# Patient Record
Sex: Female | Born: 1970
Health system: Southern US, Community
[De-identification: ages and names within clinical notes are randomized; demographics above are authoritative.]

## PROBLEM LIST (undated history)

## (undated) DIAGNOSIS — E039 Hypothyroidism, unspecified: Secondary | ICD-10-CM

## (undated) DIAGNOSIS — F419 Anxiety disorder, unspecified: Secondary | ICD-10-CM

## (undated) DIAGNOSIS — K589 Irritable bowel syndrome without diarrhea: Secondary | ICD-10-CM

## (undated) DIAGNOSIS — Z8489 Family history of other specified conditions: Secondary | ICD-10-CM

## (undated) DIAGNOSIS — T8859XA Other complications of anesthesia, initial encounter: Secondary | ICD-10-CM

## (undated) DIAGNOSIS — Z9889 Other specified postprocedural states: Secondary | ICD-10-CM

## (undated) DIAGNOSIS — I10 Essential (primary) hypertension: Secondary | ICD-10-CM

## (undated) DIAGNOSIS — F329 Major depressive disorder, single episode, unspecified: Secondary | ICD-10-CM

## (undated) DIAGNOSIS — B009 Herpesviral infection, unspecified: Secondary | ICD-10-CM

## (undated) DIAGNOSIS — E079 Disorder of thyroid, unspecified: Secondary | ICD-10-CM

## (undated) DIAGNOSIS — U071 COVID-19: Secondary | ICD-10-CM

## (undated) DIAGNOSIS — R112 Nausea with vomiting, unspecified: Secondary | ICD-10-CM

## (undated) DIAGNOSIS — F32A Depression, unspecified: Secondary | ICD-10-CM

## (undated) DIAGNOSIS — R519 Headache, unspecified: Secondary | ICD-10-CM

## (undated) DIAGNOSIS — K219 Gastro-esophageal reflux disease without esophagitis: Secondary | ICD-10-CM

## (undated) DIAGNOSIS — D649 Anemia, unspecified: Secondary | ICD-10-CM

## (undated) DIAGNOSIS — Z833 Family history of diabetes mellitus: Secondary | ICD-10-CM

## (undated) DIAGNOSIS — F909 Attention-deficit hyperactivity disorder, unspecified type: Secondary | ICD-10-CM

## (undated) DIAGNOSIS — T4145XA Adverse effect of unspecified anesthetic, initial encounter: Secondary | ICD-10-CM

## (undated) DIAGNOSIS — G4730 Sleep apnea, unspecified: Secondary | ICD-10-CM

## (undated) HISTORY — DX: Gastro-esophageal reflux disease without esophagitis: K21.9

## (undated) HISTORY — DX: Disorder of thyroid, unspecified: E07.9

## (undated) HISTORY — DX: Major depressive disorder, single episode, unspecified: F32.9

## (undated) HISTORY — PX: BREAST SURGERY: SHX581

## (undated) HISTORY — PX: TUBAL LIGATION: SHX77

## (undated) HISTORY — DX: Essential (primary) hypertension: I10

## (undated) HISTORY — DX: Herpesviral infection, unspecified: B00.9

## (undated) HISTORY — PX: CRYOTHERAPY: SHX1416

## (undated) HISTORY — DX: Family history of diabetes mellitus: Z83.3

## (undated) HISTORY — DX: Anxiety disorder, unspecified: F41.9

## (undated) HISTORY — DX: COVID-19: U07.1

## (undated) HISTORY — DX: Attention-deficit hyperactivity disorder, unspecified type: F90.9

## (undated) HISTORY — PX: TONSILLECTOMY: SUR1361

## (undated) HISTORY — DX: Irritable bowel syndrome, unspecified: K58.9

## (undated) HISTORY — PX: ESSURE TUBAL LIGATION: SUR464

## (undated) HISTORY — PX: OTHER SURGICAL HISTORY: SHX169

## (undated) HISTORY — DX: Depression, unspecified: F32.A

---

## 1991-08-14 DIAGNOSIS — O24419 Gestational diabetes mellitus in pregnancy, unspecified control: Secondary | ICD-10-CM

## 1991-08-14 HISTORY — DX: Gestational diabetes mellitus in pregnancy, unspecified control: O24.419

## 2007-10-22 ENCOUNTER — Ambulatory Visit: Payer: Self-pay | Admitting: General Surgery

## 2008-05-03 ENCOUNTER — Ambulatory Visit: Payer: Self-pay | Admitting: General Surgery

## 2008-08-13 HISTORY — PX: BREAST EXCISIONAL BIOPSY: SUR124

## 2008-11-30 ENCOUNTER — Ambulatory Visit: Payer: Self-pay | Admitting: General Surgery

## 2009-03-17 ENCOUNTER — Ambulatory Visit: Payer: Self-pay | Admitting: Obstetrics & Gynecology

## 2010-03-17 ENCOUNTER — Emergency Department: Payer: Self-pay | Admitting: Emergency Medicine

## 2011-07-16 ENCOUNTER — Ambulatory Visit: Payer: Self-pay | Admitting: Otolaryngology

## 2011-12-19 ENCOUNTER — Ambulatory Visit: Payer: Self-pay | Admitting: Otolaryngology

## 2014-11-24 ENCOUNTER — Ambulatory Visit
Admit: 2014-11-24 | Disposition: A | Discharge status: Home / Self Care | Payer: Self-pay | Attending: Family Medicine | Admitting: Family Medicine

## 2015-01-19 ENCOUNTER — Telehealth: Payer: Self-pay | Admitting: Obstetrics and Gynecology

## 2015-01-19 NOTE — Telephone Encounter (Signed)
PT CALLED AND WAS WANTING TO KNOW THE RESULTS OF HER BX.Francis Dowse.SHE WAS HERE BEFORE GO [facility]IVE AND SHE DOES HAVE AN APPT ON 6/21 FOR A F/U AUB AND WASN'T SURE IOF SHE NEEDED TO WAIT UNTIL THAT APPT TO GET THE RESULTS OR IF SHE COULD GO AHEAD AND GET THEM.

## 2015-01-19 NOTE — Telephone Encounter (Signed)
Please notify:  Normal Endometrial biopsy

## 2015-01-19 NOTE — Telephone Encounter (Signed)
pls review endometrial bx results and I will call pt.- ty

## 2015-01-21 NOTE — Telephone Encounter (Signed)
Pt aware.

## 2015-02-01 ENCOUNTER — Encounter: Payer: Self-pay | Admitting: Obstetrics and Gynecology

## 2015-02-01 ENCOUNTER — Ambulatory Visit (INDEPENDENT_AMBULATORY_CARE_PROVIDER_SITE_OTHER): Payer: BLUE CROSS/BLUE SHIELD | Admitting: Obstetrics and Gynecology

## 2015-02-01 VITALS — BP 126/83 | HR 97 | Ht 61.0 in | Wt 158.2 lb

## 2015-02-01 DIAGNOSIS — K589 Irritable bowel syndrome without diarrhea: Secondary | ICD-10-CM | POA: Insufficient documentation

## 2015-02-01 DIAGNOSIS — A6000 Herpesviral infection of urogenital system, unspecified: Secondary | ICD-10-CM | POA: Insufficient documentation

## 2015-02-01 DIAGNOSIS — I10 Essential (primary) hypertension: Secondary | ICD-10-CM | POA: Insufficient documentation

## 2015-02-01 DIAGNOSIS — F419 Anxiety disorder, unspecified: Secondary | ICD-10-CM | POA: Insufficient documentation

## 2015-02-01 DIAGNOSIS — Z720 Tobacco use: Secondary | ICD-10-CM | POA: Insufficient documentation

## 2015-02-01 DIAGNOSIS — N926 Irregular menstruation, unspecified: Secondary | ICD-10-CM

## 2015-02-01 DIAGNOSIS — F909 Attention-deficit hyperactivity disorder, unspecified type: Secondary | ICD-10-CM | POA: Insufficient documentation

## 2015-02-01 DIAGNOSIS — N910 Primary amenorrhea: Secondary | ICD-10-CM | POA: Diagnosis not present

## 2015-02-01 DIAGNOSIS — N939 Abnormal uterine and vaginal bleeding, unspecified: Secondary | ICD-10-CM

## 2015-02-01 DIAGNOSIS — F32A Depression, unspecified: Secondary | ICD-10-CM | POA: Insufficient documentation

## 2015-02-01 DIAGNOSIS — K219 Gastro-esophageal reflux disease without esophagitis: Secondary | ICD-10-CM | POA: Insufficient documentation

## 2015-02-01 DIAGNOSIS — E039 Hypothyroidism, unspecified: Secondary | ICD-10-CM | POA: Insufficient documentation

## 2015-02-01 DIAGNOSIS — F329 Major depressive disorder, single episode, unspecified: Secondary | ICD-10-CM | POA: Insufficient documentation

## 2015-02-01 LAB — POCT URINE PREGNANCY: PREG TEST UR: NEGATIVE

## 2015-02-01 NOTE — Progress Notes (Signed)
Patient ID: Pamela Avila, female   DOB: 1970/10/10, 44 y.o.   MRN: [id number]   Pt presents for AUB. S/p endosee and emb. Pt is 15 days late  For cycle.    OB/GYN CONFERENCE NOTE:  Subjective:       Pamela Avila is a 44 y.o. [phone] female who presents for a conference appointment. Current complaints include:  1.  Abnormal uterine bleeding.  Pamela Avila presents today for conference to discuss further management planning of abnormal uterine bleeding.  She is using the Essure device for contraception.  Her cycles have been troublesome over the past year with pre-and postmenstrual bleeding of 5 days' duration.  The patient recently underwent endometrial biopsy that revealed proliferative endometrium without hyperplasia or carcinoma.  Patient is desiring treatment for this condition.  If bleeding persists.  NovaSure endometrial ablation was discussed; patient will contact us if she wishes to proceed with this procedure in the near future.   Gynecologic History Patient's last menstrual period was 12/23/2014 (lmp unknown). Contraception: E sure  Obstetric History OB History  Gravida Para Term Preterm AB SAB TAB Ectopic Multiple Living  # Outcome Date GA Lbr Len/2nd Weight Sex Delivery Anes PTL Lv  3 Term 1999   6 lb 4.8 oz (2.858 kg) F CS-Unspec   Y  2A Gravida 1997    F    ND  2B Preterm 1997   1 lb 6.4 oz (0.635 kg) F CS-Unspec   Y  1 Term 1993   7 lb 4.8 oz (3.311 kg) M VBAC   Y      Past Medical History  Diagnosis Date  . HSV (herpes simplex virus) infection   . GERD (gastroesophageal reflux disease)   . IBS (irritable bowel syndrome)   . Hypertension   . ADHD (attention deficit hyperactivity disorder)   . Anxiety disorder   . Depression   . Thyroid disease     hypothroidsim    Past Surgical History  Procedure Laterality Date  . Essure tubal ligation    . Tonsillectomy    . Cesarean section      x 2  . Lumpectomy Right   . Cryotherapy  20 + years ago     cervix    No current outpatient prescriptions on file prior to visit.   No current facility-administered medications on file prior to visit.    Allergies  Allergen Reactions  . Erythromycin Itching    History   Social History  . Marital Status: Married    Spouse Name: N/A  . Number of Children: N/A  . Years of Education: N/A   Occupational History  . Not on file.   Social History Main Topics  . Smoking status: Light Tobacco Smoker -- 0.25 packs/day    Types: Cigarettes  . Smokeless tobacco: Not on file  . Alcohol Use: Yes     Comment: occasional  . Drug Use: No  . Sexual Activity: Yes   Other Topics Concern  . Not on file   Social History Narrative  . No narrative on file    Family History  Problem Relation Age of Onset  . Hypothyroidism Paternal Grandfather   . Breast cancer Paternal Grandmother   . Breast cancer Maternal Grandmother   . Hyperlipidemia Maternal Grandfather   . Diabetes Father   . Colon cancer Neg Hx   . Ovarian cancer Paternal Aunt     ?  The following portions of the patient's history were reviewed and updated as appropriate: allergies, current medications, past family history, past medical history, past social history, past surgical history and problem list.  Review of Systems  Objective:   BP 126/83 mmHg  Pulse 97  Ht 5\' 1"  (1.549 m)  Wt 158 lb 3.2 oz (71.759 kg)  BMI 29.91 kg/m2  LMP 12/23/2014 (LMP Unknown)   Assessment/Plan:  1.  Abnormal uterine bleeding. Plan: NovaSure endometrial ablation in the near future.    A total of 15 minutes were spent face-to-face with the patient during this encounter and over half of that time dealt with counseling and coordination of care.   Return to Clinic: 4-6 weeks for preop   Herold Harms, MD [facility]

## 2015-02-01 NOTE — Patient Instructions (Signed)
1.  Menstrual calendar, monitoring 2.  NovaSure endometrial ablation.-Scheduled. 3.  Return for preop appointment

## 2015-02-17 ENCOUNTER — Other Ambulatory Visit: Payer: Self-pay

## 2015-02-18 MED ORDER — AMPHETAMINE-DEXTROAMPHETAMINE 7.5 MG PO TABS: 1.0000 | ORAL_TABLET | Freq: Two times a day (BID) | ORAL | Status: DC | PRN

## 2015-03-08 ENCOUNTER — Encounter: Payer: Self-pay | Admitting: Family Medicine

## 2015-03-08 NOTE — Progress Notes (Signed)
Patient has appt for controlled substances on August 15th [place] reviewed Fill dates:  July 8th, May 31st, April 20th She does not take her medicine every single day She certainly will not need a refill of medicine while I am out of the office August 1-5 She should not need any additional medicine prior to appointment with me in the office

## 2015-03-16 DIAGNOSIS — E079 Disorder of thyroid, unspecified: Secondary | ICD-10-CM | POA: Insufficient documentation

## 2015-03-16 DIAGNOSIS — F419 Anxiety disorder, unspecified: Secondary | ICD-10-CM | POA: Insufficient documentation

## 2015-03-16 DIAGNOSIS — F329 Major depressive disorder, single episode, unspecified: Secondary | ICD-10-CM | POA: Insufficient documentation

## 2015-03-16 DIAGNOSIS — Z833 Family history of diabetes mellitus: Secondary | ICD-10-CM | POA: Insufficient documentation

## 2015-03-16 DIAGNOSIS — K589 Irritable bowel syndrome without diarrhea: Secondary | ICD-10-CM | POA: Insufficient documentation

## 2015-03-16 DIAGNOSIS — I10 Essential (primary) hypertension: Secondary | ICD-10-CM | POA: Insufficient documentation

## 2015-03-16 DIAGNOSIS — F32A Depression, unspecified: Secondary | ICD-10-CM | POA: Insufficient documentation

## 2015-03-28 ENCOUNTER — Encounter: Payer: BLUE CROSS/BLUE SHIELD | Admitting: Family Medicine

## 2015-03-28 ENCOUNTER — Telehealth: Payer: Self-pay | Admitting: Family Medicine

## 2015-03-28 NOTE — Telephone Encounter (Signed)
Pt came in and was wondering about her adderall. She was scheduled for today but we had to reschedule and she was wondering when she would be getting her refill.

## 2015-03-29 ENCOUNTER — Other Ambulatory Visit: Payer: Self-pay | Admitting: Family Medicine

## 2015-03-29 MED ORDER — AMPHETAMINE-DEXTROAMPHETAMINE 7.5 MG PO TABS: 1.0000 | ORAL_TABLET | Freq: Two times a day (BID) | ORAL | Status: DC | PRN

## 2015-03-29 NOTE — Telephone Encounter (Signed)
Routing to provider  

## 2015-03-29 NOTE — Telephone Encounter (Signed)
I apologize for being out of the office yesterday I have a prescription ready for pick-up; ask her to schedule an appt within the next two weeks please for ADHD follow-up

## 2015-03-30 NOTE — Telephone Encounter (Signed)
Routing to provider  

## 2015-04-07 ENCOUNTER — Encounter
Admission: RE | Admit: 2015-04-07 | Discharge: 2015-04-07 | Disposition: A | Discharge status: Home / Self Care | Payer: BLUE CROSS/BLUE SHIELD | Source: Ambulatory Visit | Attending: Obstetrics and Gynecology | Admitting: Obstetrics and Gynecology

## 2015-04-07 ENCOUNTER — Ambulatory Visit (INDEPENDENT_AMBULATORY_CARE_PROVIDER_SITE_OTHER): Payer: BLUE CROSS/BLUE SHIELD | Admitting: Obstetrics and Gynecology

## 2015-04-07 ENCOUNTER — Other Ambulatory Visit: Payer: Self-pay

## 2015-04-07 ENCOUNTER — Encounter: Payer: Self-pay | Admitting: Obstetrics and Gynecology

## 2015-04-07 VITALS — BP 126/85 | HR 111 | Ht 61.0 in | Wt 158.8 lb

## 2015-04-07 DIAGNOSIS — Z01818 Encounter for other preprocedural examination: Secondary | ICD-10-CM

## 2015-04-07 DIAGNOSIS — N939 Abnormal uterine and vaginal bleeding, unspecified: Secondary | ICD-10-CM

## 2015-04-07 DIAGNOSIS — Z01810 Encounter for preprocedural cardiovascular examination: Secondary | ICD-10-CM | POA: Insufficient documentation

## 2015-04-07 DIAGNOSIS — Z01812 Encounter for preprocedural laboratory examination: Secondary | ICD-10-CM | POA: Insufficient documentation

## 2015-04-07 HISTORY — DX: Hypothyroidism, unspecified: E03.9

## 2015-04-07 HISTORY — DX: Anemia, unspecified: D64.9

## 2015-04-07 LAB — CBC WITH DIFFERENTIAL/PLATELET
BASOS ABS: 0.1 10*3/uL (ref 0–0.1)
Basophils Relative: 1 %
EOS PCT: 3 %
Eosinophils Absolute: 0.2 10*3/uL (ref 0–0.7)
HEMATOCRIT: 41.4 % (ref 35.0–47.0)
Hemoglobin: 13.8 g/dL (ref 12.0–16.0)
LYMPHS PCT: 34 %
Lymphs Abs: 3 10*3/uL (ref 1.0–3.6)
MCH: 29.9 pg (ref 26.0–34.0)
MCHC: 33.4 g/dL (ref 32.0–36.0)
MCV: 89.3 fL (ref 80.0–100.0)
MONO ABS: 0.8 10*3/uL (ref 0.2–0.9)
MONOS PCT: 9 %
NEUTROS ABS: 4.6 10*3/uL (ref 1.4–6.5)
Neutrophils Relative %: 53 %
PLATELETS: 326 10*3/uL (ref 150–440)
RBC: 4.64 MIL/uL (ref 3.80–5.20)
RDW: 13 % (ref 11.5–14.5)
WBC: 8.7 10*3/uL (ref 3.6–11.0)

## 2015-04-07 LAB — RAPID HIV SCREEN (HIV 1/2 AB+AG)
HIV 1/2 Antibodies: NONREACTIVE
HIV-1 P24 Antigen - HIV24: NONREACTIVE

## 2015-04-07 NOTE — Progress Notes (Signed)
Patient ID: Pamela Avila, female   DOB: 01-31-1971, 44 y.o.   MRN: [id number] Pre-op Hysteroscopy, D&C, novasure ablation- 04/11/2015  Subjective:     Pamela Avila is a 44 y.o. [phone] female who presents for Surgical management of abnormal uterine bleeding refractory to conservative medical therapy.  She is using the Essure device for contraception. Her cycles have been troublesome over the past year with pre-and postmenstrual bleeding of 5 days' duration.  The patient recently underwent endometrial biopsy that revealed proliferative endometrium without hyperplasia or carcinoma.  Patient is desiring treatment Through hysteroscopy/D&C and NovaSure endometrial ablation.  Gynecologic History Patient's last menstrual period was 12/23/2014 (lmp unknown). Contraception: Essure Discussed Blood/Blood Products: no   Menstrual History: OB History    Gravida Para Term Preterm AB TAB SAB Ectopic Multiple Living   Menarche age: NA  Patient's last menstrual period was 03/21/2015 (exact date).    Past Medical History  Diagnosis Date  . HSV (herpes simplex virus) infection   . GERD (gastroesophageal reflux disease)   . IBS (irritable bowel syndrome)   . ADHD (attention deficit hyperactivity disorder)   . Anxiety disorder   . Thyroid disease     hypothroidsim  . Anxiety   . Depression   . Hypertension   . Family history of diabetes mellitus   . Hypothyroidism   . Anemia     Past Surgical History  Procedure Laterality Date  . Essure tubal ligation    . Tonsillectomy    . Lumpectomy Right   . Cryotherapy  20 + years ago    cervix  . Cesarean section      x 2  . Tubal ligation    . Breast surgery Right     lumpectomy    OB History  Gravida Para Term Preterm AB SAB TAB Ectopic Multiple Living  # Outcome Date GA Lbr Len/2nd Weight Sex Delivery Anes PTL Lv  3 Term 1999   6 lb 4.8 oz (2.858 kg) F CS-Unspec   Y   2A Gravida 1997    F    ND  2B Preterm 1997   1 lb 6.4 oz (0.635 kg) F CS-Unspec   Y  1 Term 1993   7 lb 4.8 oz (3.311 kg) M VBAC   Y      Social History   Social History  . Marital Status: Married    Spouse Name: N/A  . Number of Children: N/A  . Years of Education: N/A   Social History Main Topics  . Smoking status: Light Tobacco Smoker -- 0.25 packs/day    Types: Cigarettes  . Smokeless tobacco: Never Used  . Alcohol Use: Yes     Comment: occasional  . Drug Use: No  . Sexual Activity: Yes   Other Topics Concern  . None   Social History Narrative    Family History  Problem Relation Age of Onset  . Hypothyroidism Paternal Grandfather   . Breast cancer Paternal Grandmother   . Cancer Paternal Grandmother     breast  . Breast cancer Maternal Grandmother   . Hyperlipidemia Maternal Grandfather   . Diabetes Father   . Obesity Father   . Hypertension Father   . Hyperlipidemia Father   . Colon cancer Neg Hx   . Ovarian cancer Paternal Aunt     ?  [name]  Cancer Paternal Aunt     breast and cervical  . Breast cancer Paternal Aunt   . Hypothyroidism Mother   . Breast cancer Son   . Cancer Son     breast     (Not in a hospital admission)  Allergies  Allergen Reactions  . Erythromycin Itching    Review of Systems Constitutional: No recent fever/chills/sweats Respiratory: No recent cough/bronchitis Cardiovascular: No chest pain Gastrointestinal: No recent nausea/vomiting/diarrhea Genitourinary: No UTI symptoms Hematologic/lymphatic:No history of coagulopathy or recent blood thinner use    Objective:    BP 126/85 mmHg  Pulse 111  Ht 5\' 1"  (1.549 m)  Wt 158 lb 12.8 oz (72.031 kg)  BMI 30.02 kg/m2  LMP 03/21/2015 (Exact Date)  General:   Normal  Skin:   normal  HEENT:  Normal  Neck:  Supple without Adenopathy or Thyromegaly  Lungs:   Heart:              Breasts:   Abdomen:  Pelvis:  M/S   Extremeties:  Neuro:    clear to auscultation  bilaterally   Normal without murmur   Not Examined   soft, non-tender; bowel sounds normal; no masses,  no organomegaly   Exam deferred to OR  No CVAT  Warm/Dry   Normal          Assessment:  1.  Abnormal uterine bleeding refractory to conservative medical therapies. Plan:  Hysteroscopy/D&C with NovaSure endometrial ablation.   Counseling: Procedure, risks, reasons, benefits and complications (including injury to bowel, bladder, major blood vessel, ureter, bleeding, possibility of transfusion, infection, or fistula formation) reviewed in detail. Consent signed. Preop testing ordered. Instructions reviewed, including NPO after midnight.

## 2015-04-07 NOTE — Patient Instructions (Signed)
1.  Return in 1 week for postop check 

## 2015-04-07 NOTE — H&P (Signed)
Patient ID: Pamela Avila, female   DOB: 1971-02-04, 44 y.o.   MRN: [id number] Pre-op Hysteroscopy, D&C, novasure ablation- 04/11/2015  PREOPERATIVE HISTORY AND PHYSICAL  Date of surgery: 04/11/2015 Diagnosis: 1.  Abnormal uterine bleeding refractory to conservative medical therapy    Subjective:  Pamela Avila is a 44 y.o. [phone] female who presents for Surgical management of abnormal uterine bleeding refractory to conservative medical therapy.  She is using the Essure device for contraception.  Her cycles have been troublesome over the past year with pre-and postmenstrual bleeding of 5 days' duration.  The patient recently underwent endometrial biopsy that revealed proliferative endometrium without hyperplasia or carcinoma.  Patient is desiring treatment Through hysteroscopy/D&C and NovaSure endometrial ablation.    Gynecologic History Patient's last menstrual period was 12/23/2014 (lmp unknown). Contraception: Essure Discussed Blood/Blood Products: no              Menstrual History: OB History      Gravida  Para  Term  Preterm  AB  TAB  SAB  Ectopic  Multiple  Living     Menarche age: NA Patient's last menstrual period was 03/21/2015 (exact date).    Past Medical History:   .  HSV (herpes simplex virus) infection     .  GERD (gastroesophageal reflux disease)     .  IBS (irritable bowel syndrome)     .  ADHD (attention deficit hyperactivity disorder)     .  Anxiety disorder     .  Thyroid disease         hypothroidsim   .  Anxiety     .  Depression     .  Hypertension     .  Family history of diabetes mellitus     .  Hypothyroidism     .  Anemia      Past Surgical History: .  Essure tubal ligation       .  Tonsillectomy       .  Lumpectomy  Right     .  Cryotherapy    20 + years ago       cervix   .  Cesarean section           x 2   .  Tubal ligation       .  Breast surgery  Right         lumpectomy    OB History:   Gravida  Para  Term   Preterm  AB  SAB  TAB  Ectopic  Multiple  Living   #  Outcome  Date  GA  Lbr Len/2nd  Weight  Sex  Delivery  Anes  PTL  Lv   3  Term  1999      6 lb 4.8 oz (2.858 kg)  F  CS-Unspec      Y   2A  Gravida  1997        F        ND   2B  Preterm  1997      1 lb 6.4 oz (0.635 kg)  F  CS-Unspec      Y   1  Term  1993  7 lb 4.8 oz (3.311 kg)  M  VBAC      Y      Social History   .  Marital Status:  Married       Spouse Name:  N/A   .  Number of Children:  N/A   .  Years of Education:  N/A       Social History Main Topics   .  Smoking status:  Light Tobacco Smoker -- 0.25 packs/day       Types:  Cigarettes   .  Smokeless tobacco:  Never Used   .  Alcohol Use:  Yes         Comment: occasional   .  Drug Use:  No   .  Sexual Activity:  Yes       Other Topics  Concern   .  None       Social History Narrative       Family History   Problem  Relation  Age of Onset   .  Hypothyroidism  Paternal Grandfather     .  Breast cancer  Paternal Grandmother     .  Cancer  Paternal Grandmother         breast   .  Breast cancer  Maternal Grandmother     .  Hyperlipidemia  Maternal Grandfather     .  Diabetes  Father     .  Obesity  Father     .  Hypertension  Father     .  Hyperlipidemia  Father     .  Colon cancer  Neg Hx     .  Ovarian cancer  Paternal Aunt         ?   [name]  Cancer  Paternal Aunt         breast and cervical   .  Breast cancer  Paternal Aunt     .  Hypothyroidism  Mother     .  Breast cancer  Son     .  Cancer  Son         breast      (Not in a hospital admission)    Allergies   Allergen  Reactions   .  Erythromycin  Itching      Review of Systems Constitutional: No recent fever/chills/sweats Respiratory: No recent cough/bronchitis Cardiovascular: No chest pain Gastrointestinal: No recent nausea/vomiting/diarrhea Genitourinary: No UTI symptoms Hematologic/lymphatic:No history of coagulopathy or recent blood thinner use      Objective:   BP 126/85 mmHg  Pulse 111  Ht 5\' 1"  (1.549 m)  Wt 158 lb 12.8 oz (72.031 kg)  BMI 30.02 kg/m2  LMP 03/21/2015 (Exact Date)    General:    Normal   Skin:    normal   HEENT:   Normal   Neck:   Supple without Adenopathy or Thyromegaly   Lungs:     clear to auscultation bilaterally   Heart:   Normal without murmur    Breasts:   Not Examined   Abdomen:  soft, non-tender; bowel sounds normal; no masses,  no organomegaly   Pelvis:  Exam deferred to OR   M/S   No CVAT   Extremeties:  Warm/Dry  Neuro:  Normal     Assessment:   1.  Abnormal uterine bleeding refractory to conservative medical therapies.  Plan:   Hysteroscopy/D&C with NovaSure endometrial ablation.   Counseling: Procedure, risks, reasons, benefits and complications (including injury to bowel, bladder,  major blood vessel, ureter, bleeding, possibility of transfusion, infection, or fistula formation) reviewed in detail. Consent signed. Preop testing ordered. Instructions reviewed, including NPO after midnight.  Herold Harms, MD

## 2015-04-07 NOTE — Patient Instructions (Signed)
  Your procedure is scheduled on: August 29,2016 (Monday) Report to Day Surgery. [facility] To find out your arrival time please call [phone] between 1PM - 3PM on April 08, 2015 (Friday).  Remember: Instructions that are not followed completely may result in serious medical risk, up to and including death, or upon the discretion of your surgeon and anesthesiologist your surgery may need to be rescheduled.    __x__ 1. Do not eat food or drink liquids after midnight. No gum chewing or hard candies.     __x__ 2. No Alcohol for 24 hours before or after surgery.   ____ 3. Bring all medications with you on the day of surgery if instructed.    __x__ 4. Notify your doctor if there is any change in your medical condition     (cold, fever, infections).     Do not wear jewelry, make-up, hairpins, clips or nail polish.  Do not wear lotions, powders, or perfumes. You may wear deodorant.  Do not shave 48 hours prior to surgery. Men may shave face and neck.  Do not bring valuables to the hospital.    [facility] is not responsible for any belongings or valuables.               Contacts, dentures or bridgework may not be worn into surgery.  Leave your suitcase in the car. After surgery it may be brought to your room.  For patients admitted to the hospital, discharge time is determined by your                treatment team.   Patients discharged the day of surgery will not be allowed to drive home.   Please read over the following fact sheets that you were given:      ____ Take these medicines the morning of surgery with A SIP OF WATER:    1. Take Metoprolol as usual  2.   3.   4.  5.  6.  ____ Fleet Enema (as directed)   ____ Use CHG Soap as directed  ____ Use inhalers on the day of surgery  ____ Stop metformin 2 days prior to surgery    ____ Take 1/2 of usual insulin dose the night before surgery and none on the morning of surgery.   ____ Stop Coumadin/Plavix/aspirin on    ____ Stop Anti-inflammatories on    ____ Stop supplements until after surgery.    ____ Bring C-Pap to the hospital.

## 2015-04-08 LAB — RPR: RPR: NONREACTIVE

## 2015-04-11 ENCOUNTER — Ambulatory Visit: Payer: BLUE CROSS/BLUE SHIELD | Admitting: Certified Registered"

## 2015-04-11 ENCOUNTER — Other Ambulatory Visit: Payer: Self-pay | Admitting: Obstetrics and Gynecology

## 2015-04-11 ENCOUNTER — Encounter
Admission: RE | Disposition: A | Discharge status: Home / Self Care | Payer: Self-pay | Source: Ambulatory Visit | Attending: Obstetrics and Gynecology

## 2015-04-11 ENCOUNTER — Ambulatory Visit
Admission: RE | Admit: 2015-04-11 | Discharge: 2015-04-11 | Disposition: A | Discharge status: Home / Self Care | Payer: BLUE CROSS/BLUE SHIELD | Source: Ambulatory Visit | Attending: Obstetrics and Gynecology | Admitting: Obstetrics and Gynecology

## 2015-04-11 ENCOUNTER — Encounter: Payer: Self-pay | Admitting: *Deleted

## 2015-04-11 DIAGNOSIS — F17210 Nicotine dependence, cigarettes, uncomplicated: Secondary | ICD-10-CM | POA: Insufficient documentation

## 2015-04-11 DIAGNOSIS — K219 Gastro-esophageal reflux disease without esophagitis: Secondary | ICD-10-CM | POA: Insufficient documentation

## 2015-04-11 DIAGNOSIS — Z8249 Family history of ischemic heart disease and other diseases of the circulatory system: Secondary | ICD-10-CM | POA: Diagnosis not present

## 2015-04-11 DIAGNOSIS — Z8489 Family history of other specified conditions: Secondary | ICD-10-CM | POA: Insufficient documentation

## 2015-04-11 DIAGNOSIS — Z833 Family history of diabetes mellitus: Secondary | ICD-10-CM | POA: Insufficient documentation

## 2015-04-11 DIAGNOSIS — E039 Hypothyroidism, unspecified: Secondary | ICD-10-CM | POA: Insufficient documentation

## 2015-04-11 DIAGNOSIS — F329 Major depressive disorder, single episode, unspecified: Secondary | ICD-10-CM | POA: Insufficient documentation

## 2015-04-11 DIAGNOSIS — Z8349 Family history of other endocrine, nutritional and metabolic diseases: Secondary | ICD-10-CM | POA: Diagnosis not present

## 2015-04-11 DIAGNOSIS — I10 Essential (primary) hypertension: Secondary | ICD-10-CM | POA: Diagnosis not present

## 2015-04-11 DIAGNOSIS — Z803 Family history of malignant neoplasm of breast: Secondary | ICD-10-CM | POA: Diagnosis not present

## 2015-04-11 DIAGNOSIS — N939 Abnormal uterine and vaginal bleeding, unspecified: Secondary | ICD-10-CM | POA: Diagnosis not present

## 2015-04-11 DIAGNOSIS — D649 Anemia, unspecified: Secondary | ICD-10-CM | POA: Diagnosis not present

## 2015-04-11 DIAGNOSIS — K589 Irritable bowel syndrome without diarrhea: Secondary | ICD-10-CM | POA: Diagnosis not present

## 2015-04-11 DIAGNOSIS — Z881 Allergy status to other antibiotic agents status: Secondary | ICD-10-CM | POA: Diagnosis not present

## 2015-04-11 DIAGNOSIS — F419 Anxiety disorder, unspecified: Secondary | ICD-10-CM | POA: Insufficient documentation

## 2015-04-11 DIAGNOSIS — F909 Attention-deficit hyperactivity disorder, unspecified type: Secondary | ICD-10-CM | POA: Insufficient documentation

## 2015-04-11 HISTORY — PX: DILITATION & CURRETTAGE/HYSTROSCOPY WITH NOVASURE ABLATION: SHX5568

## 2015-04-11 LAB — POCT PREGNANCY, URINE: Preg Test, Ur: NEGATIVE

## 2015-04-11 SURGERY — DILATATION & CURETTAGE/HYSTEROSCOPY WITH NOVASURE ABLATION
Anesthesia: General

## 2015-04-11 MED ORDER — PHENYLEPHRINE HCL 10 MG/ML IJ SOLN
INTRAMUSCULAR | Status: DC | PRN
  Administered 2015-04-11: 100 ug via INTRAVENOUS
  Administered 2015-04-11: 200 ug via INTRAVENOUS
  Administered 2015-04-11: 100 ug via INTRAVENOUS

## 2015-04-11 MED ORDER — OXYCODONE-ACETAMINOPHEN 5-325 MG PO TABS: 1.0000 | ORAL_TABLET | ORAL | Status: DC | PRN

## 2015-04-11 MED ORDER — FAMOTIDINE 20 MG PO TABS
ORAL_TABLET | ORAL | Status: AC
  Administered 2015-04-11: 20 mg via ORAL
  Filled 2015-04-11: qty 1

## 2015-04-11 MED ORDER — ONDANSETRON 4 MG PO TBDP: 4.0000 mg | ORAL_TABLET | Freq: Three times a day (TID) | ORAL | Status: DC | PRN

## 2015-04-11 MED ORDER — FENTANYL CITRATE (PF) 100 MCG/2ML IJ SOLN
25.0000 ug | INTRAMUSCULAR | Status: DC | PRN
  Administered 2015-04-11 (×2): 25 ug via INTRAVENOUS

## 2015-04-11 MED ORDER — ONDANSETRON HCL 4 MG/2ML IJ SOLN
INTRAMUSCULAR | Status: DC | PRN
  Administered 2015-04-11: 4 mg via INTRAVENOUS

## 2015-04-11 MED ORDER — FAMOTIDINE 20 MG PO TABS
20.0000 mg | ORAL_TABLET | Freq: Once | ORAL | Status: AC
  Administered 2015-04-11: 20 mg via ORAL

## 2015-04-11 MED ORDER — MIDAZOLAM HCL 2 MG/2ML IJ SOLN
INTRAMUSCULAR | Status: DC | PRN
  Administered 2015-04-11: 2 mg via INTRAVENOUS

## 2015-04-11 MED ORDER — FENTANYL CITRATE (PF) 100 MCG/2ML IJ SOLN
INTRAMUSCULAR | Status: AC
  Administered 2015-04-11: 25 ug via INTRAVENOUS
  Filled 2015-04-11: qty 2

## 2015-04-11 MED ORDER — LACTATED RINGERS IV SOLN
INTRAVENOUS | Status: DC
  Administered 2015-04-11: 07:00:00 via INTRAVENOUS

## 2015-04-11 MED ORDER — ONDANSETRON HCL 4 MG/2ML IJ SOLN
4.0000 mg | Freq: Once | INTRAMUSCULAR | Status: AC | PRN
  Administered 2015-04-11: 4 mg via INTRAVENOUS

## 2015-04-11 MED ORDER — PROPOFOL 10 MG/ML IV BOLUS
INTRAVENOUS | Status: DC | PRN
  Administered 2015-04-11: 150 mg via INTRAVENOUS

## 2015-04-11 MED ORDER — ONDANSETRON 4 MG PO TBDP: 4.0000 mg | ORAL_TABLET | Freq: Four times a day (QID) | ORAL | Status: DC | PRN

## 2015-04-11 MED ORDER — EPHEDRINE SULFATE 50 MG/ML IJ SOLN
INTRAMUSCULAR | Status: DC | PRN
  Administered 2015-04-11: 10 mg via INTRAVENOUS
  Administered 2015-04-11: 5 mg via INTRAVENOUS

## 2015-04-11 MED ORDER — FENTANYL CITRATE (PF) 100 MCG/2ML IJ SOLN
INTRAMUSCULAR | Status: DC | PRN
  Administered 2015-04-11 (×2): 50 ug via INTRAVENOUS

## 2015-04-11 MED ORDER — LIDOCAINE HCL (CARDIAC) 20 MG/ML IV SOLN
INTRAVENOUS | Status: DC | PRN
  Administered 2015-04-11: 60 mg via INTRAVENOUS

## 2015-04-11 MED ORDER — DEXAMETHASONE SODIUM PHOSPHATE 4 MG/ML IJ SOLN
INTRAMUSCULAR | Status: DC | PRN
  Administered 2015-04-11: 10 mg via INTRAVENOUS

## 2015-04-11 MED ORDER — ONDANSETRON HCL 4 MG/2ML IJ SOLN
INTRAMUSCULAR | Status: AC
  Filled 2015-04-11: qty 2

## 2015-04-11 MED ORDER — IBUPROFEN 800 MG PO TABS: 800.0000 mg | ORAL_TABLET | Freq: Three times a day (TID) | ORAL | Status: DC

## 2015-04-11 SURGICAL SUPPLY — 14 items
CATH ROBINSON RED A/P 16FR (CATHETERS) ×2 IMPLANT
GLOVE BIO SURGEON STRL SZ8 (GLOVE) ×8 IMPLANT
GOWN STRL REUS W/ TWL LRG LVL3 (GOWN DISPOSABLE) ×1 IMPLANT
GOWN STRL REUS W/ TWL XL LVL3 (GOWN DISPOSABLE) ×1 IMPLANT
GOWN STRL REUS W/TWL LRG LVL3 (GOWN DISPOSABLE) ×1
GOWN STRL REUS W/TWL XL LVL3 (GOWN DISPOSABLE) ×1
IV LACTATED RINGERS 1000ML (IV SOLUTION) ×2 IMPLANT
KIT RM TURNOVER CYSTO AR (KITS) ×2 IMPLANT
NOVASURE ENDOMETRIAL ABLATION (MISCELLANEOUS) ×2 IMPLANT
NS IRRIG 500ML POUR BTL (IV SOLUTION) ×2 IMPLANT
PACK DNC HYST (MISCELLANEOUS) ×2 IMPLANT
PAD OB MATERNITY 4.3X12.25 (PERSONAL CARE ITEMS) ×2 IMPLANT
PAD PREP 24X41 OB/GYN DISP (PERSONAL CARE ITEMS) ×2 IMPLANT
SURGILUBE 2OZ TUBE FLIPTOP (MISCELLANEOUS) ×2 IMPLANT

## 2015-04-11 NOTE — Anesthesia Procedure Notes (Signed)
Procedure Name: LMA Insertion Date/Time: 04/11/2015 7:48 AM Performed by: Michaele Offer Pre-anesthesia Checklist: Patient identified, Emergency Drugs available, Suction available, Patient being monitored and Timeout performed Patient Re-evaluated:Patient Re-evaluated prior to inductionOxygen Delivery Method: Circle system utilized Preoxygenation: Pre-oxygenation with 100% oxygen Intubation Type: IV induction Ventilation: Mask ventilation without difficulty LMA: LMA inserted LMA Size: 3.5 Number of attempts: 1 Placement Confirmation: positive ETCO2 and breath sounds checked- equal and bilateral Tube secured with: Tape Dental Injury: Teeth and Oropharynx as per pre-operative assessment

## 2015-04-11 NOTE — Transfer of Care (Signed)
Immediate Anesthesia Transfer of Care Note  Patient: Pamela Avila  Procedure(s) Performed: Procedure(s): DILATATION & CURETTAGE/HYSTEROSCOPY WITH NOVASURE ABLATION (N/A)  Patient Location: PACU  Anesthesia Type:General  Level of Consciousness: awake, alert , oriented and patient cooperative  Airway & Oxygen Therapy: Patient Spontanous Breathing and Patient connected to face mask oxygen  Post-op Assessment: Report given to RN, Post -op Vital signs reviewed and stable and Patient moving all extremities X 4  Post vital signs: Reviewed and stable  Last Vitals:  Filed Vitals:   04/11/15 0836  BP: 104/78  Pulse: 79  Temp: 36.3 C  Resp: 14    Complications: No apparent anesthesia complications

## 2015-04-11 NOTE — H&P (Signed)
H&P   Pamela Avila (MR# [id number])      H&P Info    Chartered loss adjuster Note Status Last Update User Last Update Date/Time   Herold Harms, MD Signed Herold Harms, MD 04/07/2015 1:42 PM    H&P    Expand All Collapse All   Patient ID: Pamela Avila, female DOB: Jan 10, 1971, 44 y.o. MRN: [id number] Pre-op Hysteroscopy, D&C, novasure ablation- 04/11/2015  PREOPERATIVE HISTORY AND PHYSICAL  Date of surgery: 04/11/2015 Diagnosis: 1. Abnormal uterine bleeding refractory to conservative medical therapy   Subjective: Pamela Avila is a 44 y.o. [phone] female who presents for Surgical management of abnormal uterine bleeding refractory to conservative medical therapy. She is using the Essure device for contraception. Her cycles have been troublesome over the past year with pre-and postmenstrual bleeding of 5 days' duration.  The patient recently underwent endometrial biopsy that revealed proliferative endometrium without hyperplasia or carcinoma.  Patient is desiring treatment Through hysteroscopy/D&C and NovaSure endometrial ablation.   Gynecologic History Patient's last menstrual period was 12/23/2014 (lmp unknown). Contraception: Essure Discussed Blood/Blood Products: no   Menstrual History: OB History   Gravida Para Term Preterm AB TAB SAB Ectopic Multiple Living   3 3 2 1 1 3   Menarche age: NA Patient's last menstrual period was 03/21/2015 (exact date).   Past Medical History:  . HSV (herpes simplex virus) infection  . GERD (gastroesophageal reflux disease)  . IBS (irritable bowel syndrome)  . ADHD (attention deficit hyperactivity disorder)  . Anxiety disorder  . Thyroid disease   hypothroidsim  . Anxiety  . Depression  . Hypertension  . Family history of diabetes mellitus  . Hypothyroidism  . Anemia   Past Surgical History: . Essure tubal  ligation  . Tonsillectomy  . Lumpectomy Right  . Cryotherapy 20 + years ago   cervix  . Cesarean section   x 2  . Tubal ligation  . Breast surgery Right   lumpectomy   OB History:  Gravida Para Term Preterm AB SAB TAB Ectopic Multiple Living  3 3 2 1 1 3     # Outcome Date GA Lbr Len/2nd Weight Sex Delivery Anes PTL Lv  3 Term 1999 6 lb 4.8 oz (2.858 kg) F CS-Unspec Y  2A Gravida 1997 F ND  2B Preterm 1997 1 lb 6.4 oz (0.635 kg) F CS-Unspec Y  1 Term 1993 7 lb 4.8 oz (3.311 kg) M VBAC Y    Social History  . Marital Status: Married   Spouse Name: N/A  . Number of Children: N/A  . Years of Education: N/A     Social History Main Topics  . Smoking status: Light Tobacco Smoker -- 0.25 packs/day   Types: Cigarettes  . Smokeless tobacco: Never Used  . Alcohol Use: Yes   Comment: occasional  . Drug Use: No  . Sexual Activity: Yes     Other Topics Concern  . None     Social History Narrative     Family History  Problem Relation Age of Onset  . Hypothyroidism Paternal Grandfather  . Breast cancer Paternal Grandmother  . Cancer Paternal Grandmother   breast  . Breast cancer Maternal Grandmother  . Hyperlipidemia Maternal Grandfather  . Diabetes Father  . Obesity Father  . Hypertension Father  . Hyperlipidemia Father  . Colon cancer Neg Hx  . Ovarian cancer Paternal Aunt   ?  [name] Cancer Paternal Aunt   breast and cervical  . Breast cancer Paternal Aunt  .  Hypothyroidism Mother  . Breast cancer Son  . Cancer Son   breast     (Not in a hospital admission)   Allergies  Allergen Reactions  . Erythromycin  Itching     Review of Systems Constitutional: No recent fever/chills/sweats Respiratory: No recent cough/bronchitis Cardiovascular: No chest pain Gastrointestinal: No recent nausea/vomiting/diarrhea Genitourinary: No UTI symptoms Hematologic/lymphatic:No history of coagulopathy or recent blood thinner use    Objective:  BP 126/85 mmHg  Pulse 111  Ht  (1.549 m)  Wt 158 lb 12.8 oz (72.031 kg)  BMI 30.02 kg/m2  LMP 03/21/2015 (Exact Date)   General: Normal  Skin: normal  HEENT: Normal  Neck: Supple without Adenopathy or Thyromegaly  Lungs: clear to auscultation bilaterally  Heart: Normal without murmur  Breasts: Not Examined  Abdomen: soft, non-tender; bowel sounds normal; no masses, no organomegaly  Pelvis: Exam deferred to OR  M/S No CVAT  Extremeties: Warm/Dry  Neuro: Normal    Assessment:  1. Abnormal uterine bleeding refractory to conservative medical therapies.  Plan:  Hysteroscopy/D&C with NovaSure endometrial ablation.   Counseling: Procedure, risks, reasons, benefits and complications (including injury to bowel, bladder, major blood vessel, ureter, bleeding, possibility of transfusion, infection, or fistula formation) reviewed in detail. Consent signed. Preop testing ordered. Instructions reviewed, including NPO after midnight.  Herold Harms, MD

## 2015-04-11 NOTE — Progress Notes (Signed)
Date of Initial H&P: 04/05/2015  History reviewed, patient examined, no change in status, stable for surgery.

## 2015-04-11 NOTE — Op Note (Signed)
OPERATIVE NOTE  Date of surgery: 04/11/2015  Preoperative diagnosis:  1. Abnormal uterine bleeding 2. History of Essure  Postoperative diagnosis: Same as above Operative procedure: 1. Hysteroscopy 2. Dilation and curettage of endometrium 3. NovaSure endometrial ablation  Surgeon: Dr. Beatris Si Assistant: None Anesthesia: Gen. with LMA   Findings: Pelvis: Gynecoid Uterus: sounded to 9  cm; cervical length 4 cm; uterus with 3.5 cm Normal appearing endometrial cavity without gross lesions.  Description of procedure Patient was brought to the operating room where she was placed in supine position. General anesthesia was induced without difficulty using the LMA technique. The patient was placed in dorsal lithotomy position using candy cane stirrups. A betadine perineal intravaginal prep and drape was performed in standard fashion. Weighted speculum was placed in the vagina. Single-tooth tenaculum was placed on the anterior cervix. Uterus was sounded to 9 cm. Hegar dilators were used up to a #8 [place] to dilate the endocervical canal. The [facility] hysteroscope using lactated Ringer's as irrigant was used for the hysteroscopy. The above-noted findings were photo documented. The hysteroscope removed. The smooth and serrated curettes were then used to curettage the endometrial cavity with production of average tissue. Stone polyp forceps were used to grasp residual tissue left behind; the left uterine cornu E sure oil was partially extracted along with guidewire NovaSure endometrial ablation was then performed in standard fashion following successful cavity The procedure was completed with a power of 96 and a duration procedure at 1 minute 30 seconds.  Repeat hysteroscopy was performed and demonstrated excellent thermal ablation.  Procedure was then terminated with all instrumentation being removed from the vagina. The patient was then awakened mobilized and taken to the recovery room in  satisfactory condition.  IV fluids: 600 mL. Urine output: 10 mL. EBL: 10 mL.  Instruments, needles, and sponge counts were verified as correct.  Herold Harms, MD 04/11/2015

## 2015-04-11 NOTE — Anesthesia Postprocedure Evaluation (Signed)
  Anesthesia Post-op Note  Patient: Pamela Avila  Procedure(s) Performed: Procedure(s): DILATATION & CURETTAGE/HYSTEROSCOPY WITH NOVASURE ABLATION (N/A)  Anesthesia type:General  Patient location: PACU  Post pain: Pain level controlled  Post assessment: Post-op Vital signs reviewed, Patient's Cardiovascular Status Stable, Respiratory Function Stable, Patent Airway and No signs of Nausea or vomiting  Post vital signs: Reviewed and stable  Last Vitals:  Filed Vitals:   04/11/15 0836  BP: 104/78  Pulse: 79  Temp: 36.3 C  Resp: 14    Level of consciousness: awake, alert  and patient cooperative  Complications: No apparent anesthesia complications

## 2015-04-11 NOTE — Discharge Instructions (Signed)
AMBULATORY SURGERY  DISCHARGE INSTRUCTIONS   1) The drugs that you were given will stay in your system until tomorrow so for the next 24 hours you should not:  A) Drive an automobile B) Make any legal decisions C) Drink any alcoholic beverage   2) You may resume regular meals tomorrow.  Today it is better to start with liquids and gradually work up to solid foods.  You may eat anything you prefer, but it is better to start with liquids, then soup and crackers, and gradually work up to solid foods.   3) Please notify your doctor immediately if you have any unusual bleeding, trouble breathing, redness and pain at the surgery site, drainage, fever, or pain not relieved by medication. 4)   5) Your post-operative visit with Dr.                                     is: Date:                        Time:    Please call to schedule your post-operative visit.  6) Additional Instructions:     Hysteroscopy, Care After Refer to this sheet in the next few weeks. These instructions provide you with information on caring for yourself after your procedure. Your health care provider may also give you more specific instructions. Your treatment has been planned according to current medical practices, but problems sometimes occur. Call your health care provider if you have any problems or questions after your procedure.  WHAT TO EXPECT AFTER THE PROCEDURE After your procedure, it is typical to have the following:  You may have some cramping. This normally lasts for a couple days.  You may have bleeding. This can vary from light spotting for a few days to menstrual-like bleeding for 3-7 days. HOME CARE INSTRUCTIONS  Rest for the first 1-2 days after the procedure.  Only take over-the-counter or prescription medicines as directed by your health care provider. Do not take aspirin. It can increase the chances of bleeding.  Take showers instead of baths for 2 weeks or as directed by your health  care provider.  Do not drive for 24 hours or as directed.  Do not drink alcohol while taking pain medicine.  Do not use tampons, douche, or have sexual intercourse for 2 weeks or until your health care provider says it is okay.  Take your temperature twice a day for 4-5 days. Write it down each time.  Follow your health care provider's advice about diet, exercise, and lifting.  If you develop constipation, you may:  Take a mild laxative if your health care provider approves.  Add bran foods to your diet.  Drink enough fluids to keep your urine clear or pale yellow.  Try to have someone with you or available to you for the first 24-48 hours, especially if you were given a general anesthetic.  Follow up with your health care provider as directed. SEEK MEDICAL CARE IF:  You feel dizzy or lightheaded.  You feel sick to your stomach (nauseous).  You have abnormal vaginal discharge.  You have a rash.  You have pain that is not controlled with medicine. SEEK IMMEDIATE MEDICAL CARE IF:  You have bleeding that is heavier than a normal menstrual period.  You have a fever.  You have increasing cramps or pain, not controlled with medicine.  You have new belly (abdominal) pain.  You pass out.  You have pain in the tops of your shoulders (shoulder strap areas).  You have shortness of breath. Document Released: 05/20/2013 Document Reviewed: 05/20/2013 [facility] Patient Information 2015 [place], [place]. This information is not intended to replace advice given to you by your health care provider. Make sure you discuss any questions you have with your health care provider.

## 2015-04-11 NOTE — Anesthesia Preprocedure Evaluation (Addendum)
Anesthesia Evaluation  Patient identified by MRN, date of birth, ID band Patient awake    Reviewed: Allergy & Precautions, NPO status , Patient's Chart, lab work & pertinent test results  Airway Mallampati: III       Dental  (+) Upper Dentures   Pulmonary Current Smoker,    + decreased breath sounds      Cardiovascular Exercise Tolerance: Good hypertension, Pt. on home beta blockers Normal cardiovascular exam    Neuro/Psych Anxiety Depression    GI/Hepatic GERD-  ,  Endo/Other  Hypothyroidism   Renal/GU      Musculoskeletal   Abdominal Normal abdominal exam  (+)   Peds  Hematology  (+) anemia ,   Anesthesia Other Findings   Reproductive/Obstetrics                            Anesthesia Physical Anesthesia Plan  ASA: II  Anesthesia Plan: General   Post-op Pain Management:    Induction: Intravenous  Airway Management Planned: LMA  Additional Equipment:   Intra-op Plan:   Post-operative Plan: Extubation in OR  Informed Consent: I have reviewed the patients History and Physical, chart, labs and discussed the procedure including the risks, benefits and alternatives for the proposed anesthesia with the patient or authorized representative who has indicated his/her understanding and acceptance.     Plan Discussed with: CRNA  Anesthesia Plan Comments:         Anesthesia Quick Evaluation

## 2015-04-13 LAB — SURGICAL PATHOLOGY

## 2015-04-19 ENCOUNTER — Encounter: Payer: Self-pay | Admitting: Obstetrics and Gynecology

## 2015-04-19 ENCOUNTER — Ambulatory Visit (INDEPENDENT_AMBULATORY_CARE_PROVIDER_SITE_OTHER): Payer: BLUE CROSS/BLUE SHIELD | Admitting: Obstetrics and Gynecology

## 2015-04-19 VITALS — BP 114/76 | HR 94 | Ht 61.0 in | Wt 157.1 lb

## 2015-04-19 DIAGNOSIS — Z09 Encounter for follow-up examination after completed treatment for conditions other than malignant neoplasm: Secondary | ICD-10-CM

## 2015-04-19 DIAGNOSIS — N939 Abnormal uterine and vaginal bleeding, unspecified: Secondary | ICD-10-CM

## 2015-04-19 NOTE — Patient Instructions (Signed)
1.  No intercourse for 4 weeks. 2.  Resume activities as tolerated. 3.  Menstrual calendar monitor. 4.  Return in 3 months for follow-up.

## 2015-04-19 NOTE — Progress Notes (Signed)
Patient ID: Pamela Avila, female   DOB: [birth year], 44 y.o.   MRN: [id number] 1 week post op  S/p d&c novasure Pain meds x 2 days ibup prn Bleeding- lite spotting Cramps- moderate  Had diarrhea on Saturday  Postoperative: 1.  Status post hysteroscopy/D&C with NovaSure ablation. 2.  History of menorrhagia.  Patient presents for her one-week postop check.  She is doing well.  Minimal spotting is noted.  Cramps were moderate.  The first day, but have since resolved.  Findings from surgery were reviewed.  Patient understands that the pathology from [facility] was benign.  1.  Part of the left coil of the Essure was removed.  OBJECTIVE: BP 114/76 mmHg  Pulse 94  Ht  (1.549 m)  Wt 157 lb 1.6 oz (71.26 kg)  BMI 29.70 kg/m2  LMP 03/21/2015 (Exact Date) Well-appearing white female in no acute distress. Pelvic exam deferred.  IMPRESSION: 1.  Normal postop check. 2.  Status post hysteroscopy/D&C with NovaSure endometrial ablation.  PLAN: 1.  No intercourse for 4 weeks. 2.  Resume all other activities as tolerated. 3.  Return in 3 months for follow-up.  Herold Harms, MD

## 2015-04-20 ENCOUNTER — Telehealth: Payer: Self-pay | Admitting: Obstetrics and Gynecology

## 2015-04-20 NOTE — Telephone Encounter (Signed)
How long before she can get in a pool,  Her surgery was Aug 29th.

## 2015-04-20 NOTE — Telephone Encounter (Signed)
Pt aware per mad 2 weeks if no d/c

## 2015-05-24 ENCOUNTER — Telehealth: Payer: Self-pay | Admitting: Family Medicine

## 2015-05-24 NOTE — Telephone Encounter (Signed)
I'm not calling the patient, just documenting where easiest to find Patient will need to be seen for her stimulant medication She has an appt with me October 13th Last visit here was actually on November 17, 2014 (more than 6 months ago), so she needs to be seen for her prescription

## 2015-05-26 ENCOUNTER — Ambulatory Visit (INDEPENDENT_AMBULATORY_CARE_PROVIDER_SITE_OTHER): Payer: BLUE CROSS/BLUE SHIELD | Admitting: Family Medicine

## 2015-05-26 ENCOUNTER — Encounter: Payer: Self-pay | Admitting: Family Medicine

## 2015-05-26 VITALS — BP 135/82 | HR 105 | Temp 99.3°F | Wt 157.0 lb

## 2015-05-26 DIAGNOSIS — Z5181 Encounter for therapeutic drug level monitoring: Secondary | ICD-10-CM | POA: Diagnosis not present

## 2015-05-26 DIAGNOSIS — I10 Essential (primary) hypertension: Secondary | ICD-10-CM | POA: Diagnosis not present

## 2015-05-26 DIAGNOSIS — E038 Other specified hypothyroidism: Secondary | ICD-10-CM | POA: Diagnosis not present

## 2015-05-26 DIAGNOSIS — H538 Other visual disturbances: Secondary | ICD-10-CM | POA: Insufficient documentation

## 2015-05-26 DIAGNOSIS — R079 Chest pain, unspecified: Secondary | ICD-10-CM | POA: Diagnosis not present

## 2015-05-26 DIAGNOSIS — Z720 Tobacco use: Secondary | ICD-10-CM

## 2015-05-26 DIAGNOSIS — I517 Cardiomegaly: Secondary | ICD-10-CM | POA: Insufficient documentation

## 2015-05-26 DIAGNOSIS — R739 Hyperglycemia, unspecified: Secondary | ICD-10-CM | POA: Insufficient documentation

## 2015-05-26 DIAGNOSIS — F909 Attention-deficit hyperactivity disorder, unspecified type: Secondary | ICD-10-CM | POA: Diagnosis not present

## 2015-05-26 DIAGNOSIS — E663 Overweight: Secondary | ICD-10-CM

## 2015-05-26 DIAGNOSIS — Z23 Encounter for immunization: Secondary | ICD-10-CM

## 2015-05-26 MED ORDER — AMPHETAMINE-DEXTROAMPHETAMINE 7.5 MG PO TABS: 1.0000 | ORAL_TABLET | Freq: Two times a day (BID) | ORAL | Status: DC | PRN

## 2015-05-26 MED ORDER — METOPROLOL SUCCINATE ER 25 MG PO TB24: 37.5000 mg | ORAL_TABLET | Freq: Every day | ORAL | Status: DC

## 2015-05-26 NOTE — Assessment & Plan Note (Signed)
Check TSH, weight stable

## 2015-05-26 NOTE — Assessment & Plan Note (Signed)
With last glucose of 100, history of gestational diabetes; check glucose and A1C today; encourage weight loss

## 2015-05-26 NOTE — Progress Notes (Signed)
BP 135/82 mmHg  Pulse 105  Temp(Src) 99.3 F (37.4 C)  Wt 157 lb (71.215 kg)  SpO2 100%   Subjective:    Patient ID: Pamela Avila, female    DOB: 08/12/1971, 44 y.o.   MRN: [id number]  HPI: Pamela Avila is a 44 y.o. female  Chief Complaint  Patient presents with  . ADHD   She has ADHD and is here for follow-up; she does not take her medicine every single day, but has taken it daily this week; will do UDS today; she denies use of recreational drugs now; had used marijuana previously which showed up and we discussed this;   Her pulse runs fast, even before being on stimulants; no palpitations; no chest pain over the left side She has had "like... I don't know, hard to describe", just hurts on the right side of the chest above the breast, below the collar bone; came on with stress in relationship; not sore to the touch, no shortness of breath; no nausea; not in the shoulder; no changes in the breast; UTD in mammograms; discomfort lasted 2 days or so, maybe 3 days; did not do anything; little more than just enough that she could tell, not bad enough to get it checked; last lipids were wonderful; no family hx of heart attack; she does smoke  Last TSH in March was abnormal, 5.7; she has been a little sluggish; hx of abnormal thyroid in the past too, 4.870 in Sept 2012; all of her TSH readings between 2012 and 2016 were normal; she has been on 88 mcg of thyroid replacement therapy for years, it looks like stable dose since that Sept 2012 adjustment  She has noticed some blurred vision; worse as she goes through the day; positive family hx of diabetes; her last glucose was 100 in March of this year  Just had questions about HPV exposure; no symptoms; had an abnormal pap smear years ago; we reviewed more recent pap smears together; January 11, 2014 was negative but no HPV done; 2013 was negative, no HPV done; she had an endometrial ablation done with Dr. Greggory Keen in August 2016; I reviewed the  endometrial curettage path report: DIAGNOSIS:  A. ENDOMETRIUM; CURETTAGE:  - SECRETORY ENDOMETRIUM WITH STROMAL BREAKDOWN CHANGES.  - NEGATIVE FOR HYPERPLASIA AND CARCINOMA.   Relevant past medical, surgical, family and social history reviewed and updated as indicated. Interim medical history since our last visit reviewed. Allergies and medications reviewed and updated.  Review of Systems Per HPI unless specifically indicated above     Objective:    BP 135/82 mmHg  Pulse 105  Temp(Src) 99.3 F (37.4 C)  Wt 157 lb (71.215 kg)  SpO2 100%  Wt Readings from Last 3 Encounters:  05/26/15 157 lb (71.215 kg)  04/19/15 157 lb 1.6 oz (71.26 kg)  04/07/15 158 lb (71.668 kg)   body mass index is 29.68 kg/(m^2).  Physical Exam  Constitutional: She appears well-developed and well-nourished. No distress.  HENT:  Head: Normocephalic and atraumatic.  Eyes: EOM are normal. No scleral icterus.  Neck: No thyromegaly present.  Cardiovascular: Regular rhythm and normal heart sounds.   No extrasystoles are present. Tachycardia present.   No murmur heard. Pulmonary/Chest: Effort normal and breath sounds normal. No respiratory distress. She has no wheezes.  Abdominal: Soft. Bowel sounds are normal. She exhibits no distension.  Musculoskeletal: Normal range of motion. She exhibits no edema.  Neurological: She is alert. She displays no tremor. She exhibits normal muscle  tone. Gait normal.  Skin: Skin is warm and dry. She is not diaphoretic. No pallor.  No flushing  Psychiatric: She has a normal mood and affect. Her behavior is normal. Judgment and thought content normal. Her speech is not rapid and/or pressured. She is not hyperactive.       Assessment & Plan:   Problem List Items Addressed This Visit      Cardiovascular and Mediastinum   Essential hypertension, benign    Increase beta-blocker; follow DASH guidelines; quit smoking; adderall ON HOLD for now      Relevant Medications    metoprolol succinate (TOPROL-XL) 25 MG 24 hr tablet   Right atrial enlargement    EKG done in August; patient implied she did not want another EKG today since she just had one; EKG reviewed by me, RAA; will get stress echo      Relevant Medications   metoprolol succinate (TOPROL-XL) 25 MG 24 hr tablet   Other Relevant Orders   Echo stress     Endocrine   Hypothyroidism    Check TSH, weight stable      Relevant Medications   metoprolol succinate (TOPROL-XL) 25 MG 24 hr tablet   Other Relevant Orders   TSH (Completed)     Other   ADHD (attention deficit hyperactivity disorder) - Primary    Urine drug screen collected today; one month Rx printed but NOT given; will get labs and stress echo before having her take any more of the stimulant      Tobacco user    Encouraged cessation      Blurred vision    With last glucose of 100, history of gestational diabetes; check glucose and A1C today; encourage weight loss      Hyperglycemia   Relevant Orders   Hgb A1c w/o eAG (Completed)   Medication monitoring encounter   Relevant Orders   Comprehensive metabolic panel (Completed)   Drug Screen, Urine (Completed)   Overweight (BMI 25.0-29.9)   Chest pain    I was going to get an EKG, but patient had one done 6 weeks ago; reviewed; RAE, possible RVH; will get stress echo; see AVS; stop smoking      Relevant Orders   Echo stress    Other Visit Diagnoses    Encounter for immunization        Needs flu shot        flu vaccine given today       Follow up plan: Return in about 2 weeks (around 06/09/2015) for recheck.  Orders Placed This Encounter  Procedures  . Flu Vaccine QUAD 36+ mos IM  . TSH  . Comprehensive metabolic panel  . Hgb A1c w/o eAG  . Drug Screen, Urine  . Echo stress   An after-visit summary was printed and given to the patient at check-out.  Please see the patient instructions which may contain other information and recommendations beyond what is mentioned  above in the assessment and plan.

## 2015-05-26 NOTE — Assessment & Plan Note (Addendum)
Urine drug screen collected today; one month Rx printed but NOT given; will get labs and stress echo before having her take any more of the stimulant

## 2015-05-26 NOTE — Assessment & Plan Note (Signed)
Increase beta-blocker; follow DASH guidelines; quit smoking; adderall ON HOLD for now

## 2015-05-26 NOTE — Patient Instructions (Addendum)
Please have your next pap smear around January 11, 2017 Be safe and be smart; use protection Work on modest weight loss Check out the information at [email or web address] entitled "What It Takes to Lose Weight" Try to lose between 1-2 pounds per week by taking in fewer calories and burning off more calories You can succeed by limiting portions, limiting foods dense in calories and fat, becoming more active, and drinking 8 glasses of water a day (64 ounces) Don't skip meals, especially breakfast, as skipping meals may alter your metabolism Do not use over-the-counter weight loss pills or gimmicks that claim rapid weight loss A healthy BMI (or body mass index) is between 18.5 and 24.9 You can calculate your ideal BMI at the NIH website [employer]huhttp://www.nhlbi.nih.gov/health/educational/lose_wt/BMI/bmicalc.htm Try to follow the DASH guidelines Increase the metoprolol XL (Toprol XL) to 1.5 pills per day (37.5 mg daily) Return in 2 weeks for next visit and we'll see how your pulse and blood pressure are on the new strength too DO NOT take any Adderall until we get your stress test If you develop significant chest pain, call 911

## 2015-05-27 LAB — COMPREHENSIVE METABOLIC PANEL
ALT: 13 IU/L (ref 0–32)
AST: 13 IU/L (ref 0–40)
Albumin/Globulin Ratio: 1.6 (ref 1.1–2.5)
Albumin: 4.2 g/dL (ref 3.5–5.5)
Alkaline Phosphatase: 66 IU/L (ref 39–117)
BUN/Creatinine Ratio: 10 (ref 9–23)
BUN: 8 mg/dL (ref 6–24)
Bilirubin Total: 0.5 mg/dL (ref 0.0–1.2)
CALCIUM: 9.7 mg/dL (ref 8.7–10.2)
CO2: 22 mmol/L (ref 18–29)
CREATININE: 0.81 mg/dL (ref 0.57–1.00)
Chloride: 100 mmol/L (ref 97–108)
GFR calc Af Amer: 103 mL/min/{1.73_m2} (ref >59)
GFR, EST NON AFRICAN AMERICAN: 89 mL/min/{1.73_m2} (ref >59)
Globulin, Total: 2.6 g/dL (ref 1.5–4.5)
Glucose: 119 mg/dL — ABNORMAL HIGH (ref 65–99)
POTASSIUM: 4.2 mmol/L (ref 3.5–5.2)
Sodium: 139 mmol/L (ref 134–144)
Total Protein: 6.8 g/dL (ref 6.0–8.5)

## 2015-05-27 LAB — DRUG SCREEN, URINE
AMPHETAMINES, URINE: POSITIVE ng/mL
BARBITURATE SCREEN URINE: NEGATIVE ng/mL
Benzodiazepine Quant, Ur: NEGATIVE ng/mL
CANNABINOID QUANT UR: NEGATIVE ng/mL
COCAINE (METAB.): NEGATIVE ng/mL
OPIATE QUANT UR: NEGATIVE ng/mL
PCP QUANT UR: NEGATIVE ng/mL

## 2015-05-27 LAB — TSH: TSH: 2.89 u[IU]/mL (ref 0.450–4.500)

## 2015-05-27 LAB — HGB A1C W/O EAG: HEMOGLOBIN A1C: 5.2 % (ref 4.8–5.6)

## 2015-05-31 NOTE — Assessment & Plan Note (Signed)
I was going to get an EKG, but patient had one done 6 weeks ago; reviewed; RAE, possible RVH; will get stress echo; see AVS; stop smoking

## 2015-05-31 NOTE — Assessment & Plan Note (Signed)
EKG done in August; patient implied she did not want another EKG today since she just had one; EKG reviewed by me, RAA; will get stress echo

## 2015-05-31 NOTE — Assessment & Plan Note (Signed)
Encouraged cessation.

## 2015-06-10 ENCOUNTER — Ambulatory Visit: Payer: BLUE CROSS/BLUE SHIELD | Admitting: Family Medicine

## 2015-06-14 ENCOUNTER — Ambulatory Visit (INDEPENDENT_AMBULATORY_CARE_PROVIDER_SITE_OTHER): Payer: BLUE CROSS/BLUE SHIELD

## 2015-06-14 ENCOUNTER — Other Ambulatory Visit: Payer: Self-pay | Admitting: Family Medicine

## 2015-06-14 DIAGNOSIS — I517 Cardiomegaly: Secondary | ICD-10-CM | POA: Diagnosis not present

## 2015-06-14 DIAGNOSIS — R079 Chest pain, unspecified: Secondary | ICD-10-CM | POA: Diagnosis not present

## 2015-06-15 LAB — ECHOCARDIOGRAM STRESS TEST
CHL CUP MPHR: 177 {beats}/min
CSEPEDS: 49 s
CSEPEW: 8.2 METS
CSEPPHR: 181 {beats}/min
Exercise duration (min): 6 min
Percent HR: 102 %
Rest HR: 102 {beats}/min

## 2015-06-17 ENCOUNTER — Encounter: Payer: Self-pay | Admitting: Family Medicine

## 2015-06-17 ENCOUNTER — Ambulatory Visit (INDEPENDENT_AMBULATORY_CARE_PROVIDER_SITE_OTHER): Payer: BLUE CROSS/BLUE SHIELD | Admitting: Family Medicine

## 2015-06-17 VITALS — BP 114/74 | HR 98 | Temp 98.7°F | Ht 61.0 in | Wt 158.0 lb

## 2015-06-17 DIAGNOSIS — F901 Attention-deficit hyperactivity disorder, predominantly hyperactive type: Secondary | ICD-10-CM

## 2015-06-17 DIAGNOSIS — R0789 Other chest pain: Secondary | ICD-10-CM | POA: Diagnosis not present

## 2015-06-17 DIAGNOSIS — Z720 Tobacco use: Secondary | ICD-10-CM | POA: Diagnosis not present

## 2015-06-17 DIAGNOSIS — I10 Essential (primary) hypertension: Secondary | ICD-10-CM | POA: Diagnosis not present

## 2015-06-17 MED ORDER — METOPROLOL SUCCINATE ER 25 MG PO TB24: 37.5000 mg | ORAL_TABLET | Freq: Every day | ORAL | Status: DC

## 2015-06-17 MED ORDER — AMPHETAMINE-DEXTROAMPHETAMINE 7.5 MG PO TABS: 1.0000 | ORAL_TABLET | Freq: Two times a day (BID) | ORAL | Status: DC | PRN

## 2015-06-17 NOTE — Assessment & Plan Note (Signed)
Encouraged cessation.

## 2015-06-17 NOTE — Patient Instructions (Addendum)
Please do STOP smoking! Return in 3 months for next visit Smoking Cessation, Tips for Success If you are ready to quit smoking, congratulations! You have chosen to help yourself be healthier. Cigarettes bring nicotine, tar, carbon monoxide, and other irritants into your body. Your lungs, heart, and blood vessels will be able to work better without these poisons. There are many different ways to quit smoking. Nicotine gum, nicotine patches, a nicotine inhaler, or nicotine nasal spray can help with physical craving. Hypnosis, support groups, and medicines help break the habit of smoking. WHAT THINGS CAN I DO TO MAKE QUITTING EASIER?  Here are some tips to help you quit for good:  Pick a date when you will quit smoking completely. Tell all of your friends and family about your plan to quit on that date.  Do not try to slowly cut down on the number of cigarettes you are smoking. Pick a quit date and quit smoking completely starting on that day.  Throw away all cigarettes.   Clean and remove all ashtrays from your home, work, and car.  On a card, write down your reasons for quitting. Carry the card with you and read it when you get the urge to smoke.  Cleanse your body of nicotine. Drink enough water and fluids to keep your urine clear or pale yellow. Do this after quitting to flush the nicotine from your body.  Learn to predict your moods. Do not let a bad situation be your excuse to have a cigarette. Some situations in your life might tempt you into wanting a cigarette.  Never have "just one" cigarette. It leads to wanting another and another. Remind yourself of your decision to quit.  Change habits associated with smoking. If you smoked while driving or when feeling stressed, try other activities to replace smoking. Stand up when drinking your coffee. Brush your teeth after eating. Sit in a different chair when you read the paper. Avoid alcohol while trying to quit, and try to drink fewer  caffeinated beverages. Alcohol and caffeine may urge you to smoke.  Avoid foods and drinks that can trigger a desire to smoke, such as sugary or spicy foods and alcohol.  Ask people who smoke not to smoke around you.  Have something planned to do right after eating or having a cup of coffee. For example, plan to take a walk or exercise.  Try a relaxation exercise to calm you down and decrease your stress. Remember, you may be tense and nervous for the first 2 weeks after you quit, but this will pass.  Find new activities to keep your hands busy. Play with a pen, coin, or rubber band. Doodle or draw things on paper.  Brush your teeth right after eating. This will help cut down on the craving for the taste of tobacco after meals. You can also try mouthwash.   Use oral substitutes in place of cigarettes. Try using lemon drops, carrots, cinnamon sticks, or chewing gum. Keep them handy so they are available when you have the urge to smoke.  When you have the urge to smoke, try deep breathing.  Designate your home as a nonsmoking area.  If you are a heavy smoker, ask your health care provider about a prescription for nicotine chewing gum. It can ease your withdrawal from nicotine.  Reward yourself. Set aside the cigarette money you save and buy yourself something nice.  Look for support from others. Join a support group or smoking cessation program. Ask someone  at home or at work to help you with your plan to quit smoking.  Always ask yourself, "Do I need this cigarette or is this just a reflex?" Tell yourself, "Today, I choose not to smoke," or "I do not want to smoke." You are reminding yourself of your decision to quit.  Do not replace cigarette smoking with electronic cigarettes (commonly called e-cigarettes). The safety of e-cigarettes is unknown, and some may contain harmful chemicals.  If you relapse, do not give up! Plan ahead and think about what you will do the next time you get  the urge to smoke. HOW WILL I FEEL WHEN I QUIT SMOKING? You may have symptoms of withdrawal because your body is used to nicotine (the addictive substance in cigarettes). You may crave cigarettes, be irritable, feel very hungry, cough often, get headaches, or have difficulty concentrating. The withdrawal symptoms are only temporary. They are strongest when you first quit but will go away within 10-14 days. When withdrawal symptoms occur, stay in control. Think about your reasons for quitting. Remind yourself that these are signs that your body is healing and getting used to being without cigarettes. Remember that withdrawal symptoms are easier to treat than the major diseases that smoking can cause.  Even after the withdrawal is over, expect periodic urges to smoke. However, these cravings are generally short lived and will go away whether you smoke or not. Do not smoke! WHAT RESOURCES ARE AVAILABLE TO HELP ME QUIT SMOKING? Your health care provider can direct you to community resources or hospitals for support, which may include:  Group support.  Education.  Hypnosis.  Therapy.   This information is not intended to replace advice given to you by your health care provider. Make sure you discuss any questions you have with your health care provider.   Document Released: 04/27/2004 Document Revised: 08/20/2014 Document Reviewed: 01/15/2013 Elsevier Interactive Patient Education [employer].

## 2015-06-17 NOTE — Assessment & Plan Note (Signed)
Reproducible with pressure over the right pectoralis major; negative stress test; should resolve

## 2015-06-17 NOTE — Assessment & Plan Note (Signed)
Refill of the ADHD medicine given; negative stress test; normalized BP

## 2015-06-17 NOTE — Assessment & Plan Note (Signed)
Controlled today; Rx sent for beta-blocker; quit smoking, limit caffeinated drinks

## 2015-06-17 NOTE — Progress Notes (Signed)
BP 114/74 mmHg  Pulse 98  Temp(Src) 98.7 F (37.1 C)  Ht  (1.549 m)  Wt 158 lb (71.668 kg)  BMI 29.87 kg/m2  SpO2 99%   Subjective:    Patient ID: Pamela Avila, female    DOB: 12-26-1970, 44 y.o.   MRN: [id number]  HPI: Pamela Avila is a 44 y.o. female  Chief Complaint  Patient presents with  . ADHD    recheck to restart Adderall   Since the last visit, had negative stress test; she has had some chest discomfort once since the last visit; it is NOT acid reflux; it is on the right side, like a tightening, points under the right clavicle above the right breast  She can tell a big difference being off of her medicine; her boss can tell too; she has to key in numbers; she always double checks and actually keyed in numbers wrong twice; there is a check-balance system, but she can tell a difference off of the medicine; she wants to go back on the medicine  Drinks Pepsi, more than two per day; no energy drinks; does smoke some  Relevant past medical, surgical, family and social history reviewed and updated as indicated. Interim medical history since our last visit reviewed. Allergies and medications reviewed and updated.  Review of Systems  Per HPI unless specifically indicated above     Objective:    BP 114/74 mmHg  Pulse 98  Temp(Src) 98.7 F (37.1 C)  Ht  (1.549 m)  Wt 158 lb (71.668 kg)  BMI 29.87 kg/m2  SpO2 99%  Wt Readings from Last 3 Encounters:  06/17/15 158 lb (71.668 kg)  05/26/15 157 lb (71.215 kg)  04/19/15 157 lb 1.6 oz (71.26 kg)    Physical Exam  Constitutional: She appears well-developed and well-nourished.  Eyes: EOM are normal.  Cardiovascular: Normal rate and regular rhythm.   Pulmonary/Chest: Effort normal and breath sounds normal. No accessory muscle usage. No respiratory distress. She has no decreased breath sounds. She exhibits tenderness (tender with plalpation over the right pectoralis).  Musculoskeletal: She exhibits no edema.   Psychiatric: She has a normal mood and affect. Her speech is normal and behavior is normal.    Results for orders placed or performed in visit on 06/14/15  Echo stress  Result Value Ref Range   LVIDD  3.5 - 6.0 cm   LVIDS  2.1 - 4.0 cm   IVS  0.6 - 1.1 cm   PW  0.6 - 1.1 mm   FS  28 - 44 %   EF  %   LV 3D EF%  %   LV LONGITUDINAL STRAIN     3D LV EDV  ml   LV RADIAL STRAIN     3D LV ESV  ml   LV STRAIN EF     LV Systolic Volume  mL   LV Systolic Volume Index  mL/m2   LV Diastolic Volume  mL   LV Diastolic Volume Index  mL/m2   LV mass  g   LV Mass Index  g/m2   LV SYMPSON'S DISK     POSTERIOR WALL SYSTOLE  mm   IV/PVOW     E/A ratio     E wave decelartion time  msec   IVRT  msec   MV "A" wave duration  msec   Pulm vein S/D ratio     Pulm vein "A" wave  msec   TDI  MV Peak E Vel  m/s   LV TDI E'MEDIAL     MV Peak A Vel  m/s   LV TDI E'LATERAL     E/E' ratio     LVOT stroke volume  cm3   LVOT diameter  cm   Qp:Qs ratio     LVOT GR Mean  mm   AV LVOT peak gradient  mmHg   AV LVOT peak gradient w/amyl nitrate  mmHg   Grad vals  mmHg   AV LVOT peak gradient post stress  mmHg   Rest HR 102 bpm   Rest BP 120/88 mmHg   Exercise duration (min) 6 min   Exercise duration (sec) 49 sec   Estimated workload 8.2 METS   Peak HR 181 bpm   Peak BP 199/79 mmHg   MPHR 177 bpm   Percent HR 102 %   RPE     LA size  cm   LA volume  cm3   LEFT ATRIUM END SYS DIAM  cm   LA Volume Index  mL/m2   PV Peak S Vel  m/s   PV Peak D Vel  m/s   LVOT area  cm2   LVOT peak vel  m/s   LVOT peak VTI  cm   AO mean calculated velocity dopler  cm/s   Ao peak vel  m/s   Ao VTI  cm   AV valve area  cm2   AV mean gradient  mmHg   AV peak gradient  mmHg   AV Velocity Ratio     Echo EF Estimated  %   CHL CUP AV VALUE AREA INDEX     CHL CUP AV PEAK INDEX     AV mean gradient post stress  mmHg   AV peak gradient post stress  mmHg   AV peak velocity post stress  m/s   AV valve area  P1/2 post stress  cm2   AORTIC VALVE SV  cm3   AV regurgitant volume  cc   AV regurgitant fraction  %   AV regurgitation pressure 1/2 time  ms   AV vena contracta  cm   CHL CUP AV VEL     CHL CUP AV DEC     CHL CUP AV REG GRAD     PISA AR VN Nyquist  m/s   AV Radius PISA  cm   AR Max Vel  m/s   AV area PISA  cm2   Ao root annulus  cm   Sinus  cm   STJ  cm   Proximal aorta  cm   Ascending aorta  cm   Aortic arch  cm   AORTA DESCENDING  cm   MV mean gradient  mmHg   MV peak gradient  mmHg   MV VTI  cm   MV stenosis pressure 1/2 time  ms   MV valve area p 1/2 method  cm2   MV valve area by planimetry  cm2   MV valve area by continuity eq  cm2   MV valve area  cm2   TV rest pulmonary artery pressure  mmHg   CHL CUP MV M VEL     Vn Nyquist MS  m/s   PISA MS Angle Cor  deg   MV mean gradient post stress  mmHg   MV valve area P1/2 post stress  cm2   PAP post stress  mmHg   CHL CUP LVOT MV VTI     Mitral  Valve Annulus Velocity Time Integral  cm   MITRAL VALVE SV  cm3   MV regurgitant volume  cc   MV regurgitation fraction  %   MV vena contracta  cm   MITRAL REGURG PEAK VELOCITY  cm/sec   MITRAL VALVE ANNULUS DIAMETER  cm   MITRAL VALVE MR JET LA AREA  cm   CHL CUP MV DEC (S)     Vn Nyquist  m/s   Radius  cm   Mr max vel  m/s   MR PISA EROA  cm2   RV LENGTH ANNULUS TO APEX  cm   RV RVID MID VENT  cm   RV RVID APEX  cm   RV RVID ANNULUS  cm   RV LATERAL S' VELOCITY  cm/sec   RV FREE WALL ANNULUS  cm/sec   RV TAPSE  cm   RV WALL THICKNESS  cm   RA SIZE  cm   RA END SYSTOLIC DIAMETER  cm   RA VOLUME  mL   RA PV PEAK S VELOCITY  cm/sec   RA PV PEAK D VELOCITY  cm/sec   RVOT stroke volume  cm3   TV mean gradient  mmHg   TV peak gradient  mmHg   TV VTI  cm   Tricuspid Valve E Wave Peak Velocity  cm/s   Tricuspid Valve A Wave Peak Velocity  cm/s   TRICUSPID VALVE ANNULUS DIAMETER  cm   TV stenosis pressure 1/2 time  ms   TV Valve Area by P 1/2 Method  cm2   TV  Valve Area by Continuity Equation  cm2   TV peak systolic pulmonary PAP  mmHg   CHL CUP TRI VEL MEAN     TRICUSPID VALVE SV  cm   TV Regurgitant Volume  cc   TV regurgitation fraction  %   TV vena contracta  cm   PISA TR VN Nyquist  m/s   PISA TR Radius  cm   TR Max Vel  m/s   TV area PISA  cm2      Assessment & Plan:   Problem List Items Addressed This Visit      Cardiovascular and Mediastinum   Essential hypertension, benign    Controlled today; Rx sent for beta-blocker; quit smoking, limit caffeinated drinks      Relevant Medications   metoprolol succinate (TOPROL-XL) 25 MG 24 hr tablet     Other   ADHD (attention deficit hyperactivity disorder)    Refill of the ADHD medicine given; negative stress test; normalized BP      Tobacco user - Primary    Encouraged cessation      Chest pain    Reproducible with pressure over the right pectoralis major; negative stress test; should resolve         Follow up plan: Return in about 3 months (around 09/17/2015) for ADHD follow-up.    Meds ordered this encounter  Medications  . Probiotic Product (PROBIOTIC PO)    Sig: Take by mouth daily.  [name] amphetamine-dextroamphetamine (ADDERALL) 7.5 MG tablet    Sig: Take 1 tablet by mouth 2 (two) times daily as needed.    Dispense:  60 tablet    Refill:  0  . metoprolol succinate (TOPROL-XL) 25 MG 24 hr tablet    Sig: Take 1.5 tablets (37.5 mg total) by mouth daily.    Dispense:  135 tablet    Refill:  3    New instructions   An  after-visit summary was printed and given to the patient at check-out.  Please see the patient instructions which may contain other information and recommendations beyond what is mentioned above in the assessment and plan.

## 2015-07-19 ENCOUNTER — Ambulatory Visit: Payer: BLUE CROSS/BLUE SHIELD | Admitting: Obstetrics and Gynecology

## 2015-07-20 ENCOUNTER — Other Ambulatory Visit: Payer: Self-pay

## 2015-07-20 NOTE — Telephone Encounter (Signed)
Patient states that we should have received RX refill requests from [facility] for several medications.  Patient states that if we contact [facility] and give them Invoice#[id number]  Or  Invoice#[id number] we can obtain all of the refills she is requesting. Patient referenced a refill on Celexa, Metoprolol and Strattera  [facility] Phone Number: [phone]

## 2015-07-20 NOTE — Telephone Encounter (Signed)
The Metoprolol and Citalopram and Strattera were sent to her local pharmacy, they need to be sent to her mail order.

## 2015-07-21 ENCOUNTER — Other Ambulatory Visit: Payer: Self-pay | Admitting: Family Medicine

## 2015-07-21 MED ORDER — ATOMOXETINE HCL 80 MG PO CAPS: 80.0000 mg | ORAL_CAPSULE | Freq: Every day | ORAL | Status: DC

## 2015-07-21 MED ORDER — CITALOPRAM HYDROBROMIDE 40 MG PO TABS: 40.0000 mg | ORAL_TABLET | Freq: Every day | ORAL | Status: DC

## 2015-07-21 MED ORDER — METOPROLOL SUCCINATE ER 25 MG PO TB24: 37.5000 mg | ORAL_TABLET | Freq: Every day | ORAL | Status: DC

## 2015-07-21 NOTE — Telephone Encounter (Signed)
rx

## 2015-07-21 NOTE — Telephone Encounter (Signed)
Normal TSH reviewed Oct 2016; Rx approved

## 2015-07-21 NOTE — Telephone Encounter (Signed)
done

## 2015-07-21 NOTE — Telephone Encounter (Signed)
Sent to [facility] for completion

## 2015-08-24 ENCOUNTER — Other Ambulatory Visit: Payer: Self-pay | Admitting: Family Medicine

## 2015-08-24 MED ORDER — AMPHETAMINE-DEXTROAMPHETAMINE 7.5 MG PO TABS: 1.0000 | ORAL_TABLET | Freq: Two times a day (BID) | ORAL | Status: DC | PRN

## 2015-08-24 NOTE — Telephone Encounter (Signed)
Last adderall Rx filled 07/27/2015; next due on or after 08/26/2015; she has appt with me for late January already scheduled [facility] site reviewed Rx approved

## 2015-08-24 NOTE — Telephone Encounter (Signed)
Routing to provider  

## 2015-08-24 NOTE — Telephone Encounter (Signed)
Pt came in to pick up RX for Adderrall. There is no RX up front for her. Please call pt when RX is ready for pick up. Thanks.

## 2015-09-09 ENCOUNTER — Encounter: Payer: Self-pay | Admitting: Family Medicine

## 2015-09-09 ENCOUNTER — Ambulatory Visit (INDEPENDENT_AMBULATORY_CARE_PROVIDER_SITE_OTHER): Payer: BLUE CROSS/BLUE SHIELD | Admitting: Family Medicine

## 2015-09-09 VITALS — BP 118/84 | HR 103 | Temp 99.3°F | Wt 166.0 lb

## 2015-09-09 DIAGNOSIS — I10 Essential (primary) hypertension: Secondary | ICD-10-CM | POA: Diagnosis not present

## 2015-09-09 DIAGNOSIS — F419 Anxiety disorder, unspecified: Secondary | ICD-10-CM | POA: Diagnosis not present

## 2015-09-09 DIAGNOSIS — F901 Attention-deficit hyperactivity disorder, predominantly hyperactive type: Secondary | ICD-10-CM

## 2015-09-09 DIAGNOSIS — R739 Hyperglycemia, unspecified: Secondary | ICD-10-CM | POA: Diagnosis not present

## 2015-09-09 DIAGNOSIS — E038 Other specified hypothyroidism: Secondary | ICD-10-CM | POA: Diagnosis not present

## 2015-09-09 DIAGNOSIS — K580 Irritable bowel syndrome with diarrhea: Secondary | ICD-10-CM

## 2015-09-09 DIAGNOSIS — J069 Acute upper respiratory infection, unspecified: Secondary | ICD-10-CM | POA: Diagnosis not present

## 2015-09-09 MED ORDER — AMPHETAMINE-DEXTROAMPHETAMINE 7.5 MG PO TABS: 1.0000 | ORAL_TABLET | Freq: Two times a day (BID) | ORAL | Status: DC | PRN

## 2015-09-09 NOTE — Assessment & Plan Note (Signed)
Controlled today; continue beta-blocker

## 2015-09-09 NOTE — Assessment & Plan Note (Signed)
Reviewed last A1c, 5.2; labs yearly to r/o development of diabetes

## 2015-09-09 NOTE — Assessment & Plan Note (Signed)
With some increased stress lately; continue SSRI; suggested counseling, but she is not interested; I told her that she is welcome to call back and I can get her a list of names

## 2015-09-09 NOTE — Assessment & Plan Note (Signed)
With some increased symptoms related to recent stress; suggested counseling but she was not interested

## 2015-09-09 NOTE — Assessment & Plan Note (Signed)
No early fills on [place]; three months of prescriptions written out with appropriate fill on or after dates; signed controlled substance agreement on the chart, signed in October 2016; last UDS was appropriate; no concern for misuse or diversion

## 2015-09-09 NOTE — Patient Instructions (Signed)
Return in 3 months Try vitamin C (orange juice if not diabetic or vitamin C tablets) and drink green tea to help your immune system during your illness Get plenty of rest and hydration Consider counseling, and you can find a counselor through [facility]

## 2015-09-09 NOTE — Assessment & Plan Note (Signed)
Weight gain noted, acknowledged; checked the last TSH, normal in October; will keep an eye on her weight and if trending up, then recheck TSH

## 2015-09-09 NOTE — Progress Notes (Signed)
BP 118/84 mmHg  Pulse 103  Temp(Src) 99.3 F (37.4 C)  Wt 166 lb (75.297 kg)  SpO2 100%   Subjective:    Patient ID: Pamela Avila, female    DOB: May 17, 1971, 45 y.o.   MRN: [id number]  HPI: Pamela Avila is a 45 y.o. female  Chief Complaint  Patient presents with  . ADHD    12 week follow up/med refill  . URI    sick for a few days, some sore throat, congestion, slight cough   She has ADHD; diagnosed as an adult; on the medicine, she notices when she first falls asleep, she will jerk; not waking her up through the night; no known sleep apnea; no heavy snoring; no jitters or tremors; having some loose stools, but under more stress; no weight loss; gaining weight from stress; medicine helps her concentrate better and finish tasks; she would like to continue at same dose; ADHD runs in the family  Taking citalopram for anxiety and depression; working well for the most part, but more stress now; does not want counseling  Hypothyroidism, on thyroid replacement  Ref Range 63mo ago    TSH 0.450 - 4.500 uIU/mL 2.890         She has a cold; wanted to be checked to see if she needs an antibiotics; she has been sick for three days; not bad sore throat, but it's irritated; slight cough; was in an emergency room last week; ears are congested and stopped up; sinuses were stopped up and now they are stopped up and running; no swollen glands in the neck, nothing new; no rash; no travel; has been using hall's lozenges, ibuprofen, allergy medicine, vitamin C  Relevant past medical, surgical, family and social history reviewed and updated as indicated. Interim medical history since our last visit reviewed. Allergies and medications reviewed and updated.  Review of Systems  Per HPI unless specifically indicated above     Objective:    BP 118/84 mmHg  Pulse 103  Temp(Src) 99.3 F (37.4 C)  Wt 166 lb (75.297 kg)  SpO2 100%  Wt Readings from Last 3 Encounters:  09/09/15 166 lb (75.297  kg)  06/17/15 158 lb (71.668 kg)  05/26/15 157 lb (71.215 kg)    Physical Exam  Constitutional: She appears well-developed and well-nourished. No distress.  Weight gain 8 pounds noted since last visit 3 months ago  HENT:  Head: Normocephalic and atraumatic.  Right Ear: Hearing, tympanic membrane, external ear and ear canal normal. Tympanic membrane is not erythematous. No middle ear effusion.  Left Ear: Hearing, tympanic membrane, external ear and ear canal normal. Tympanic membrane is not erythematous.  No middle ear effusion.  Nose: Rhinorrhea (clear) present. No mucosal edema.  Mouth/Throat: Oropharynx is clear and moist and mucous membranes are normal.  Eyes: EOM are normal. No scleral icterus.  Neck: No thyromegaly present.  Cardiovascular: Regular rhythm and normal heart sounds.   No extrasystoles are present. Tachycardia present.   No murmur heard. Pulmonary/Chest: Effort normal and breath sounds normal. No respiratory distress. She has no wheezes.  Abdominal: Soft. Bowel sounds are normal. She exhibits no distension.  Musculoskeletal: Normal range of motion. She exhibits no edema.  Lymphadenopathy:    She has no cervical adenopathy.  Neurological: She is alert. She exhibits normal muscle tone.  Reflex Scores:      Patellar reflexes are 2+ on the right side and 2+ on the left side. Skin: Skin is warm and dry. She  is not diaphoretic. No pallor.  Psychiatric: She has a normal mood and affect. Her behavior is normal. Judgment and thought content normal.       Assessment & Plan:   Problem List Items Addressed This Visit      Cardiovascular and Mediastinum   Essential hypertension, benign    Controlled today; continue beta-blocker        Digestive   Irritable bowel syndrome    With some increased symptoms related to recent stress; suggested counseling but she was not interested        Endocrine   Hypothyroidism    Weight gain noted, acknowledged; checked the last  TSH, normal in October; will keep an eye on her weight and if trending up, then recheck TSH        Other   ADHD (attention deficit hyperactivity disorder) - Primary    No early fills on [facility]S[place]; three months of prescriptions written out with appropriate fill on or after dates; signed controlled substance agreement on the chart, signed in October 2016; last UDS was appropriate; no concern for misuse or diversion      Anxiety    With some increased stress lately; continue SSRI; suggested counseling, but she is not interested; I told her that she is welcome to call back and I can get her a list of names      Hyperglycemia    Reviewed last A1c, 5.2; labs yearly to r/o development of diabetes       Other Visit Diagnoses    Viral upper respiratory infection        explained antibiotics not indicated for this; rest, hydration, vit C, etc; cautioned against antibiotics unless absolutely necessary, risk of C diff discussed        Follow up plan: Return in about 3 months (around 12/08/2015) for medication management.  An after-visit summary was printed and given to the patient at check-out.  Please see the patient instructions which may contain other information and recommendations beyond what is mentioned above in the assessment and plan. Meds ordered this encounter  Medications  . DISCONTD: amphetamine-dextroamphetamine (ADDERALL) 7.5 MG tablet    Sig: Take 1 tablet by mouth 2 (two) times daily as needed.    Dispense:  60 tablet    Refill:  0  . DISCONTD: amphetamine-dextroamphetamine (ADDERALL) 7.5 MG tablet    Sig: Take 1 tablet by mouth 2 (two) times daily as needed.    Dispense:  60 tablet    Refill:  0    Fill on or after October 09, 2015  . amphetamine-dextroamphetamine (ADDERALL) 7.5 MG tablet    Sig: Take 1 tablet by mouth 2 (two) times daily as needed.    Dispense:  60 tablet    Refill:  0    Fill on or after November 07, 2015

## 2015-10-03 ENCOUNTER — Other Ambulatory Visit: Payer: Self-pay

## 2015-10-03 MED ORDER — METOPROLOL SUCCINATE ER 25 MG PO TB24: 37.5000 mg | ORAL_TABLET | Freq: Every day | ORAL | Status: DC

## 2015-10-03 MED ORDER — LEVOTHYROXINE SODIUM 88 MCG PO TABS: 88.0000 ug | ORAL_TABLET | Freq: Every day | ORAL | Status: DC

## 2015-10-03 NOTE — Telephone Encounter (Addendum)
90 day supply request Request for Levothyroxine tablets #90 and Metoprolol Succ ER Tablet Pharmacy: [facility].

## 2015-10-03 NOTE — Telephone Encounter (Signed)
Last TSH reviewed; rxs approved

## 2015-12-07 ENCOUNTER — Ambulatory Visit: Payer: BLUE CROSS/BLUE SHIELD | Admitting: Family Medicine

## 2015-12-09 ENCOUNTER — Other Ambulatory Visit: Payer: Self-pay | Admitting: Family Medicine

## 2015-12-10 NOTE — Telephone Encounter (Signed)
approved

## 2016-01-02 ENCOUNTER — Ambulatory Visit (INDEPENDENT_AMBULATORY_CARE_PROVIDER_SITE_OTHER): Payer: BLUE CROSS/BLUE SHIELD | Admitting: Family Medicine

## 2016-01-02 ENCOUNTER — Encounter: Payer: Self-pay | Admitting: Family Medicine

## 2016-01-02 VITALS — BP 108/75 | HR 92 | Temp 98.7°F | Ht 61.0 in | Wt 171.0 lb

## 2016-01-02 DIAGNOSIS — R635 Abnormal weight gain: Secondary | ICD-10-CM

## 2016-01-02 DIAGNOSIS — F418 Other specified anxiety disorders: Secondary | ICD-10-CM

## 2016-01-02 DIAGNOSIS — E038 Other specified hypothyroidism: Secondary | ICD-10-CM | POA: Diagnosis not present

## 2016-01-02 DIAGNOSIS — F901 Attention-deficit hyperactivity disorder, predominantly hyperactive type: Secondary | ICD-10-CM

## 2016-01-02 DIAGNOSIS — F329 Major depressive disorder, single episode, unspecified: Secondary | ICD-10-CM

## 2016-01-02 DIAGNOSIS — F419 Anxiety disorder, unspecified: Secondary | ICD-10-CM

## 2016-01-02 DIAGNOSIS — F32A Depression, unspecified: Secondary | ICD-10-CM

## 2016-01-02 DIAGNOSIS — J0100 Acute maxillary sinusitis, unspecified: Secondary | ICD-10-CM

## 2016-01-02 MED ORDER — AMPHETAMINE-DEXTROAMPHETAMINE 7.5 MG PO TABS: 1.0000 | ORAL_TABLET | Freq: Two times a day (BID) | ORAL | Status: DC | PRN

## 2016-01-02 MED ORDER — CITALOPRAM HYDROBROMIDE 40 MG PO TABS: 60.0000 mg | ORAL_TABLET | Freq: Every day | ORAL | Status: DC

## 2016-01-02 MED ORDER — AMOXICILLIN-POT CLAVULANATE 875-125 MG PO TABS: 1.0000 | ORAL_TABLET | Freq: Two times a day (BID) | ORAL | Status: DC

## 2016-01-02 NOTE — Assessment & Plan Note (Signed)
Unclear if her anxiety or her ADD is the cause of her decreased concentration. Will increase her celexa to 60mg  daily and check in 1 month, if not doing well consider increase in her adderall. Continue to monitor. Call with any problems.

## 2016-01-02 NOTE — Assessment & Plan Note (Signed)
Unclear if this is the cause of the trouble concentrating or if it is due to her ADD. Will increase her celexa to 60mg  daily and check in 1 month, if not doing well consider increase in her adderall. Continue to monitor. Call with any problems.

## 2016-01-02 NOTE — Assessment & Plan Note (Signed)
Has been gaining a significant amount of weight. Possibly stress related, we will check her thyroid labs and adjust as needed.

## 2016-01-02 NOTE — Progress Notes (Signed)
BP 108/75 mmHg  Pulse 92  Temp(Src) 98.7 F (37.1 C)  Ht  (1.549 m)  Wt 171 lb (77.565 kg)  BMI 32.33 kg/m2  SpO2 99%   Subjective:    Patient ID: Pamela Avila, female    DOB: 1971-04-22, 45 y.o.   MRN: [id number]  HPI: Pamela Avila is a 45 y.o. female  Chief Complaint  Patient presents with  . Medication Refill    ADD Medication  . Sinus Problem    Drainage  . Weight Gain   ADHD FOLLOW UP- took her last pill on Saturday morning, will be positive for marijuana, has been under a lot of stress recently and has been concerned about if this was ADD or anxiety ADHD status: stable Satisfied with current therapy: no Medication compliance:  excellent compliance Controlled substance contract: yes Previous psychiatry evaluation: no Previous medications: no    Taking meds on weekends/vacations: occasionally Work/school performance:  fair Difficulty sustaining attention/completing tasks: yes Distracted by extraneous stimuli: yes Does not listen when spoken to: no  Fidgets with hands or feet: no Unable to stay in seat: no Blurts out/interrupts others: no- occasionally ADHD Medication Side Effects: no    Decreased appetite: no    Headache: no    Sleeping disturbance pattern: no    Irritability: yes- not from the medicine    Rebound effects (worse than baseline) off medication: no    Anxiousness: yes - not from the medicine    Dizziness: no    Tics: no  WEIGHT GAIN- has been under a lot of stress recently and thinks that is why her weight is going up. Thyroid checked 3 months ago was normal, but will recheck again today. Duration: chronic Previous attempts at weight loss: yes Complications of obesity: HTN Peak weight: current  UPPER RESPIRATORY TRACT INFECTION Duration: several weeks Worst symptom: drainage Fever: no Cough: yes Shortness of breath: no Wheezing: no Chest pain: no Chest tightness: no Chest congestion: no Nasal congestion: yes Runny nose: no Post  nasal drip: yes Sneezing: no Sore throat: no Swollen glands: no Sinus pressure: yes Headache: yes Face pain: no Toothache: no Ear pain: no  Ear pressure: no  Eyes red/itching:no Eye drainage/crusting: no  Vomiting: no Rash: no Fatigue: yes Sick contacts: no Strep contacts: no  Context: fluctuating Recurrent sinusitis: no Relief with OTC cold/cough medications: yes  Treatments attempted: cold/sinus, mucinex, anti-histamine and pseudoephedrine    Relevant past medical, surgical, family and social history reviewed and updated as indicated. Interim medical history since our last visit reviewed. Allergies and medications reviewed and updated.  Review of Systems  Constitutional: Positive for unexpected weight change. Negative for fever, chills, diaphoresis, activity change, appetite change and fatigue.  HENT: Positive for congestion, postnasal drip, rhinorrhea and sinus pressure. Negative for dental problem, drooling, ear discharge, ear pain, facial swelling, hearing loss, mouth sores, nosebleeds, sneezing, sore throat, tinnitus, trouble swallowing and voice change.   Eyes: Positive for discharge.  Respiratory: Negative.   Cardiovascular: Negative.   Psychiatric/Behavioral: Positive for behavioral problems, confusion, sleep disturbance, decreased concentration and agitation. Negative for suicidal ideas, hallucinations, self-injury and dysphoric mood. The patient is nervous/anxious. The patient is not hyperactive.     Per HPI unless specifically indicated above     Objective:    BP 108/75 mmHg  Pulse 92  Temp(Src) 98.7 F (37.1 C)  Ht  (1.549 m)  Wt 171 lb (77.565 kg)  BMI 32.33 kg/m2  SpO2 99%  Wt Readings  from Last 3 Encounters:  01/02/16 171 lb (77.565 kg)  09/09/15 166 lb (75.297 kg)  06/17/15 158 lb (71.668 kg)    Physical Exam  Constitutional: She is oriented to person, place, and time. She appears well-developed and well-nourished. No distress.  HENT:   Head: Normocephalic and atraumatic.  Right Ear: Hearing, tympanic membrane, external ear and ear canal normal.  Left Ear: Hearing, tympanic membrane, external ear and ear canal normal.  Nose: Mucosal edema and rhinorrhea present. Right sinus exhibits maxillary sinus tenderness. Right sinus exhibits no frontal sinus tenderness. Left sinus exhibits maxillary sinus tenderness. Left sinus exhibits no frontal sinus tenderness.  Mouth/Throat: Uvula is midline, oropharynx is clear and moist and mucous membranes are normal. No oropharyngeal exudate.  Eyes: Conjunctivae, EOM and lids are normal. Pupils are equal, round, and reactive to light. Right eye exhibits no discharge. Left eye exhibits no discharge. No scleral icterus.  Neck: Normal range of motion. Neck supple. No JVD present. No tracheal deviation present. No thyromegaly present.  Cardiovascular: Normal rate, regular rhythm, normal heart sounds and intact distal pulses.  Exam reveals no gallop and no friction rub.   No murmur heard. Pulmonary/Chest: Effort normal and breath sounds normal. No stridor. No respiratory distress. She has no wheezes. She has no rales. She exhibits no tenderness.  Musculoskeletal: Normal range of motion.  Lymphadenopathy:    She has cervical adenopathy.  Neurological: She is alert and oriented to person, place, and time.  Skin: Skin is warm, dry and intact. No rash noted. She is not diaphoretic. No erythema. No pallor.  Psychiatric: She has a normal mood and affect. Her speech is normal and behavior is normal. Judgment and thought content normal. Cognition and memory are normal.  Nursing note and vitals reviewed.      Assessment & Plan:   Problem List Items Addressed This Visit      Endocrine   Hypothyroidism    Has been gaining a significant amount of weight. Possibly stress related, we will check her thyroid labs and adjust as needed.       Relevant Orders   Thyroid Panel With TSH     Other   ADHD  (attention deficit hyperactivity disorder) - Primary    Unclear if her anxiety or her ADD is the cause of her decreased concentration. Will increase her celexa to 60mg  daily and check in 1 month, if not doing well consider increase in her adderall. Continue to monitor. Call with any problems.       Relevant Orders   [id number] 11+Oxyco+Alc+Crt-Bund   Anxiety and depression    Unclear if this is the cause of the trouble concentrating or if it is due to her ADD. Will increase her celexa to 60mg  daily and check in 1 month, if not doing well consider increase in her adderall. Continue to monitor. Call with any problems.        Other Visit Diagnoses    Acute maxillary sinusitis, recurrence not specified        Will treat with augmentin. Call if not getting better or getting worse.     Relevant Medications    amoxicillin-clavulanate (AUGMENTIN) 875-125 MG tablet    Weight gain        Will check her thyroid labs and adjust as needed.         Follow up plan: Return in about 4 weeks (around 01/30/2016).

## 2016-01-03 ENCOUNTER — Telehealth: Payer: Self-pay | Admitting: Family Medicine

## 2016-01-03 LAB — THYROID PANEL WITH TSH
FREE THYROXINE INDEX: 2.2 (ref 1.2–4.9)
T3 UPTAKE RATIO: 25 % (ref 24–39)
T4 TOTAL: 8.9 ug/dL (ref 4.5–12.0)
TSH: 1.47 u[IU]/mL (ref 0.450–4.500)

## 2016-01-03 NOTE — Telephone Encounter (Signed)
Erroneous- patient notified by mychart.

## 2016-01-05 LAB — DRUG SCREEN 764883 11+OXYCO+ALC+CRT-BUND
AMPHETAMINES, URINE: NEGATIVE ng/mL
BARBITURATE: NEGATIVE ng/mL
BENZODIAZ UR QL: NEGATIVE ng/mL
COCAINE (METABOLITE): NEGATIVE ng/mL
CREATININE: 169 mg/dL (ref 20.0–300.0)
ETHANOL: NEGATIVE %
MEPERIDINE: NEGATIVE ng/mL
METHADONE SCREEN, URINE: NEGATIVE ng/mL
OPIATE SCREEN URINE: NEGATIVE ng/mL
Oxycodone/Oxymorphone, Urine: NEGATIVE ng/mL
PHENCYCLIDINE: NEGATIVE ng/mL
Propoxyphene: NEGATIVE ng/mL
TRAMADOL: NEGATIVE ng/mL
pH, Urine: 5.8 (ref 4.5–8.9)

## 2016-01-05 LAB — CANNABINOID CONFIRMATION, UR
CANNABINOIDS, UR: POSITIVE — AB
Carboxy THC GC/MS Conf: 62 ng/mL

## 2016-02-10 ENCOUNTER — Encounter: Payer: Self-pay | Admitting: Family Medicine

## 2016-02-10 ENCOUNTER — Ambulatory Visit (INDEPENDENT_AMBULATORY_CARE_PROVIDER_SITE_OTHER): Payer: BLUE CROSS/BLUE SHIELD | Admitting: Family Medicine

## 2016-02-10 VITALS — BP 121/84 | HR 98 | Temp 98.5°F | Ht 61.0 in | Wt 164.0 lb

## 2016-02-10 DIAGNOSIS — F419 Anxiety disorder, unspecified: Secondary | ICD-10-CM

## 2016-02-10 DIAGNOSIS — F418 Other specified anxiety disorders: Secondary | ICD-10-CM | POA: Diagnosis not present

## 2016-02-10 DIAGNOSIS — J0101 Acute recurrent maxillary sinusitis: Secondary | ICD-10-CM | POA: Diagnosis not present

## 2016-02-10 DIAGNOSIS — F901 Attention-deficit hyperactivity disorder, predominantly hyperactive type: Secondary | ICD-10-CM

## 2016-02-10 DIAGNOSIS — F329 Major depressive disorder, single episode, unspecified: Secondary | ICD-10-CM

## 2016-02-10 DIAGNOSIS — F32A Depression, unspecified: Secondary | ICD-10-CM

## 2016-02-10 MED ORDER — AMPHETAMINE-DEXTROAMPHETAMINE 7.5 MG PO TABS: 1.0000 | ORAL_TABLET | Freq: Two times a day (BID) | ORAL | Status: DC | PRN

## 2016-02-10 MED ORDER — CEFDINIR 300 MG PO CAPS: 300.0000 mg | ORAL_CAPSULE | Freq: Two times a day (BID) | ORAL | Status: DC

## 2016-02-10 MED ORDER — PREDNISONE 50 MG PO TABS: 50.0000 mg | ORAL_TABLET | Freq: Every day | ORAL | Status: DC

## 2016-02-10 NOTE — Progress Notes (Signed)
BP 121/84 mmHg  Pulse 98  Temp(Src) 98.5 F (36.9 C)  Ht 5\' 1"  (1.549 m)  Wt 164 lb (74.39 kg)  BMI 31.00 kg/m2  SpO2 99%   Subjective:    Patient ID: Pamela FlowKaren C Duclos, female    DOB: [birth year], 45 y.o.   MRN: [id number]  HPI: Pamela Avila is a 45 y.o. female  Chief Complaint  Patient presents with  . ADHD  . URI   ADHD FOLLOW UP ADHD status: controlled Satisfied with current therapy: yes Medication compliance:  excellent compliance Controlled substance contract: yes Taking meds on weekends/vacations: occasionally Work/school performance:  good Difficulty sustaining attention/completing tasks: no Distracted by extraneous stimuli: no Does not listen when spoken to: no  Fidgets with hands or feet: no Unable to stay in seat: no Blurts out/interrupts others: no ADHD Medication Side Effects: no  ANXIETY/DEPRESSION Duration:better Anxious mood: no  Excessive worrying: no Irritability: no  Sweating: no Nausea: no Palpitations:no Hyperventilation: no Panic attacks: no Agoraphobia: no  Obscessions/compulsions: no Depressed mood: no Depression screen PHQ 2/9 02/10/2016  Decreased Interest 1  Down, Depressed, Hopeless 0  PHQ - 2 Score 1    GAD 7 : Generalized Anxiety Score 02/10/2016  Nervous, Anxious, on Edge 1  Control/stop worrying 0  Worry too much - different things 0  Trouble relaxing 1  Restless 0  Easily annoyed or irritable 0  Afraid - awful might happen 0  Total GAD 7 Score 2  Anxiety Difficulty Somewhat difficult   Anhedonia: no Weight changes: no Insomnia: no   Hypersomnia: no Fatigue/loss of energy: yes Feelings of worthlessness: no Feelings of guilt: no Impaired concentration/indecisiveness: no Suicidal ideations: no  Crying spells: no Recent Stressors/Life Changes: no  UPPER RESPIRATORY TRACT INFECTION Duration: 2 weeks, worse over the last days Worst symptom: congestion Fever: no Cough: yes Shortness of breath: no Wheezing: no Chest  pain: no Chest tightness: no Chest congestion: yes Nasal congestion: yes Runny nose: yes Post nasal drip: yes Sneezing: yes Sore throat: yes Swollen glands: yes Sinus pressure: yes Headache: yes Face pain: no Toothache: no Ear pain: no  Ear pressure: yes bilateral Eyes red/itching:yes Eye drainage/crusting: no  Vomiting: yes Rash: no Fatigue: yes Sick contacts: yes Strep contacts: no  Context: worse Recurrent sinusitis: no Relief with OTC cold/cough medications: no  Treatments attempted: mucinex    Relevant past medical, surgical, family and social history reviewed and updated as indicated. Interim medical history since our last visit reviewed. Allergies and medications reviewed and updated.  Review of Systems  Constitutional: Negative.   HENT: Positive for congestion, postnasal drip, rhinorrhea, sinus pressure, sneezing and sore throat. Negative for dental problem, drooling, ear discharge, ear pain, facial swelling, hearing loss, mouth sores, nosebleeds, tinnitus, trouble swallowing and voice change.   Respiratory: Negative.   Cardiovascular: Negative.   Psychiatric/Behavioral: Negative.     Per HPI unless specifically indicated above     Objective:    BP 121/84 mmHg  Pulse 98  Temp(Src) 98.5 F (36.9 C)  Ht 5\' 1"  (1.549 m)  Wt 164 lb (74.39 kg)  BMI 31.00 kg/m2  SpO2 99%  Wt Readings from Last 3 Encounters:  02/10/16 164 lb (74.39 kg)  01/02/16 171 lb (77.565 kg)  09/09/15 166 lb (75.297 kg)    Physical Exam  Constitutional: She is oriented to person, place, and time. She appears well-developed and well-nourished. No distress.  HENT:  Head: Normocephalic and atraumatic.  Right Ear: Hearing, tympanic membrane, external ear and  ear canal normal.  Left Ear: Hearing, tympanic membrane, external ear and ear canal normal.  Nose: Mucosal edema and rhinorrhea present. Right sinus exhibits maxillary sinus tenderness. Right sinus exhibits no frontal sinus  tenderness. Left sinus exhibits no maxillary sinus tenderness and no frontal sinus tenderness.  Mouth/Throat: Uvula is midline, oropharynx is clear and moist and mucous membranes are normal. No oropharyngeal exudate.  Eyes: Conjunctivae, EOM and lids are normal. Pupils are equal, round, and reactive to light. Right eye exhibits no discharge. Left eye exhibits no discharge. No scleral icterus.  Neck: Normal range of motion. Neck supple. No JVD present. No tracheal deviation present. No thyromegaly present.  Cardiovascular: Normal rate, regular rhythm, normal heart sounds and intact distal pulses.  Exam reveals no gallop and no friction rub.   No murmur heard. Pulmonary/Chest: Effort normal and breath sounds normal. No stridor. No respiratory distress. She has no wheezes. She has no rales. She exhibits no tenderness.  Musculoskeletal: Normal range of motion.  Lymphadenopathy:    She has cervical adenopathy.  Neurological: She is alert and oriented to person, place, and time.  Skin: Skin is warm, dry and intact. No rash noted. She is not diaphoretic. No erythema. No pallor.  Psychiatric: She has a normal mood and affect. Her speech is normal and behavior is normal. Judgment and thought content normal. Cognition and memory are normal.  Nursing note and vitals reviewed.   Results for orders placed or performed in visit on 01/02/16  Thyroid Panel With TSH  Result Value Ref Range   TSH 1.470 0.450 - 4.500 uIU/mL   T4, Total 8.9 4.5 - 12.0 ug/dL   T3 Uptake Ratio 25 24 - 39 %   Free Thyroxine Index 2.2 1.2 - 4.9  [id number] 11+Oxyco+Alc+Crt-Bund  Result Value Ref Range   Ethanol Negative Cutoff=0.020 %   Amphetamines, Urine Negative Cutoff=1000 ng/mL   Barbiturate Negative Cutoff=200 ng/mL   BENZODIAZ UR QL Negative Cutoff=200 ng/mL   Cannabinoid Quant, Ur See Final Results Cutoff=50 ng/mL   Cocaine (Metabolite) Negative Cutoff=300 ng/mL   OPIATE SCREEN URINE Negative Cutoff=300 ng/mL    OXYCODONE+OXYMORPHONE UR QL SCN Negative Cutoff=300 ng/mL   Phencyclidine Negative Cutoff=25 ng/mL   Methadone Screen, Urine Negative Cutoff=300 ng/mL   Propoxyphene Negative Cutoff=300 ng/mL   Meperidine Negative Cutoff=200 ng/mL   Tramadol Negative Cutoff=200 ng/mL   Creatinine 169.0 20.0 - 300.0 mg/dL   PH OF URINE 5.8 4.5 - 8.9  Cannabinoid Conf, Ur  Result Value Ref Range   CANNABINOIDS Positive (A) Cutoff=50   Carboxy THC GC/MS Conf 62 Cutoff=15 ng/mL      Assessment & Plan:   Problem List Items Addressed This Visit      Other   ADHD (attention deficit hyperactivity disorder) - Primary    Better with anxiety and depression treated. Continue current regimen. 3 month supply given. Call with any concerns. Recheck 3 months.       Anxiety and depression    Better. Stable now. Continue current regimen. Recheck 6 months.       Depression    Better. Stable now. Continue current regimen. Recheck 6 months.        Other Visit Diagnoses    Acute recurrent maxillary sinusitis        Will treat with omnicef and prednisone. If not better, will need to go back to ENT.    Relevant Medications    predniSONE (DELTASONE) 50 MG tablet    cefdinir (OMNICEF) 300 MG capsule  Follow up plan: Return in about 3 months (around 05/12/2016) for ADD.

## 2016-02-10 NOTE — Assessment & Plan Note (Signed)
Better with anxiety and depression treated. Continue current regimen. 3 month supply given. Call with any concerns. Recheck 3 months.

## 2016-02-10 NOTE — Assessment & Plan Note (Signed)
Better. Stable now. Continue current regimen. Recheck 6 months.  

## 2016-02-10 NOTE — Assessment & Plan Note (Signed)
Better. Stable now. Continue current regimen. Recheck 6 months.

## 2016-05-10 ENCOUNTER — Ambulatory Visit (INDEPENDENT_AMBULATORY_CARE_PROVIDER_SITE_OTHER): Payer: BLUE CROSS/BLUE SHIELD | Admitting: Family Medicine

## 2016-05-10 ENCOUNTER — Encounter: Payer: Self-pay | Admitting: Family Medicine

## 2016-05-10 VITALS — BP 102/70 | HR 96 | Temp 98.3°F | Wt 167.0 lb

## 2016-05-10 DIAGNOSIS — Z113 Encounter for screening for infections with a predominantly sexual mode of transmission: Secondary | ICD-10-CM

## 2016-05-10 DIAGNOSIS — H938X3 Other specified disorders of ear, bilateral: Secondary | ICD-10-CM | POA: Diagnosis not present

## 2016-05-10 DIAGNOSIS — F901 Attention-deficit hyperactivity disorder, predominantly hyperactive type: Secondary | ICD-10-CM

## 2016-05-10 DIAGNOSIS — Z23 Encounter for immunization: Secondary | ICD-10-CM | POA: Diagnosis not present

## 2016-05-10 MED ORDER — AMPHETAMINE-DEXTROAMPHETAMINE 7.5 MG PO TABS: 1.0000 | ORAL_TABLET | Freq: Two times a day (BID) | ORAL | 0 refills | Status: DC | PRN

## 2016-05-10 MED ORDER — ATOMOXETINE HCL 80 MG PO CAPS: 80.0000 mg | ORAL_CAPSULE | Freq: Every day | ORAL | 1 refills | Status: DC

## 2016-05-10 MED ORDER — METOPROLOL SUCCINATE ER 25 MG PO TB24: 37.5000 mg | ORAL_TABLET | Freq: Every day | ORAL | 2 refills | Status: DC

## 2016-05-10 MED ORDER — LEVOTHYROXINE SODIUM 88 MCG PO TABS: 88.0000 ug | ORAL_TABLET | Freq: Every day | ORAL | 2 refills | Status: DC

## 2016-05-10 NOTE — Assessment & Plan Note (Signed)
Stable. Refills for 3 months given today. Continue to monitor. Call with any concerns.

## 2016-05-10 NOTE — Progress Notes (Signed)
BP 102/70 (BP Location: Left Arm, Patient Position: Sitting, Cuff Size: Normal)   Pulse 96   Temp 98.3 F (36.8 C)   Wt 167 lb (75.8 kg)   SpO2 99%   BMI 31.55 kg/m    Subjective:    Patient ID: Pamela Avila, female    DOB: Jun 13, 1971, 45 y.o.   MRN: [id number]  HPI: Pamela Avila is a 45 y.o. female  Chief Complaint  Patient presents with  . ADHD   ADHD FOLLOW UP- sex addiction is distracting her and causing problems for her to focus ADHD status: stable Satisfied with current therapy: no Medication compliance:  excellent compliance Controlled substance contract: yes Previous psychiatry evaluation: no Taking meds on weekends/vacations: yes Work/school performance:  good Difficulty sustaining attention/completing tasks: yes Distracted by extraneous stimuli: yes Does not listen when spoken to: yes  Fidgets with hands or feet: no Unable to stay in seat: no Blurts out/interrupts others: no ADHD Medication Side Effects: no  EAR PAIN Duration: days Involved ear(s): bilateral Severity:  mild  Quality:  aching Fever: no Otorrhea: no Upper respiratory infection symptoms: no Pruritus: no Hearing loss: no Water immersion no Using Q-tips: no Recurrent otitis media: no Status: stable Treatments attempted: none  STD SCREENING- has a sex addiction. This is getting to be a problem for her. Causing her issues.  Sexual activity:  Recent unprotected sexual encounter Contraception: yes Recent unprotected intercourse: yes History of sexually transmitted diseases: no Previous sexually transmitted disease screening: yes Lifetime sexual partners:  Genital lesions: no Penile discharge: no Dysuria: no Swollen lymph nodes: no Fevers: no Rash: no  Relevant past medical, surgical, family and social history reviewed and updated as indicated. Interim medical history since our last visit reviewed. Allergies and medications reviewed and updated.  Review of Systems    Constitutional: Negative.   HENT: Negative.   Respiratory: Negative.   Cardiovascular: Negative.   Psychiatric/Behavioral: Negative.     Per HPI unless specifically indicated above     Objective:    BP 102/70 (BP Location: Left Arm, Patient Position: Sitting, Cuff Size: Normal)   Pulse 96   Temp 98.3 F (36.8 C)   Wt 167 lb (75.8 kg)   SpO2 99%   BMI 31.55 kg/m   Wt Readings from Last 3 Encounters:  05/10/16 167 lb (75.8 kg)  02/10/16 164 lb (74.4 kg)  01/02/16 171 lb (77.6 kg)    Physical Exam  Constitutional: She is oriented to person, place, and time. She appears well-developed and well-nourished. No distress.  HENT:  Head: Normocephalic and atraumatic.  Right Ear: Hearing and external ear normal.  Left Ear: Hearing and external ear normal.  Nose: Nose normal.  Mouth/Throat: Oropharynx is clear and moist. No oropharyngeal exudate.  Eyes: Conjunctivae and lids are normal. Right eye exhibits no discharge. Left eye exhibits no discharge. No scleral icterus.  Cardiovascular: Normal rate, regular rhythm, normal heart sounds and intact distal pulses.  Exam reveals no gallop and no friction rub.   No murmur heard. Pulmonary/Chest: Effort normal and breath sounds normal. No respiratory distress. She has no wheezes. She has no rales. She exhibits no tenderness.  Musculoskeletal: Normal range of motion.  Neurological: She is alert and oriented to person, place, and time.  Skin: Skin is warm, dry and intact. No rash noted. She is not diaphoretic. No erythema. No pallor.  Psychiatric: She has a normal mood and affect. Her speech is normal and behavior is normal. Judgment and thought  content normal. Cognition and memory are normal.  Nursing note and vitals reviewed.   Results for orders placed or performed in visit on 01/02/16  Thyroid Panel With TSH  Result Value Ref Range   TSH 1.470 0.450 - 4.500 uIU/mL   T4, Total 8.9 4.5 - 12.0 ug/dL   T3 Uptake Ratio 25 24 - 39 %    Free Thyroxine Index 2.2 1.2 - 4.9  [id number] 11+Oxyco+Alc+Crt-Bund  Result Value Ref Range   Ethanol Negative Cutoff=0.020 %   Amphetamines, Urine Negative Cutoff=1000 ng/mL   Barbiturate Negative Cutoff=200 ng/mL   BENZODIAZ UR QL Negative Cutoff=200 ng/mL   Cannabinoid Quant, Ur See Final Results Cutoff=50 ng/mL   Cocaine (Metabolite) Negative Cutoff=300 ng/mL   OPIATE SCREEN URINE Negative Cutoff=300 ng/mL   OXYCODONE+OXYMORPHONE UR QL SCN Negative Cutoff=300 ng/mL   Phencyclidine Negative Cutoff=25 ng/mL   Methadone Screen, Urine Negative Cutoff=300 ng/mL   Propoxyphene Negative Cutoff=300 ng/mL   Meperidine Negative Cutoff=200 ng/mL   Tramadol Negative Cutoff=200 ng/mL   Creatinine 169.0 20.0 - 300.0 mg/dL   PH OF URINE 5.8 4.5 - 8.9  Cannabinoid Conf, Ur  Result Value Ref Range   CANNABINOIDS Positive (A) Cutoff=50   Carboxy THC GC/MS Conf 62 Cutoff=15 ng/mL      Assessment & Plan:   Problem List Items Addressed This Visit      Other   ADHD (attention deficit hyperactivity disorder) - Primary    Stable. Refills for 3 months given today. Continue to monitor. Call with any concerns.        Other Visit Diagnoses    Routine screening for STI (sexually transmitted infection)       Screening labs checked today. Await results. Information about sex addiction counselors given today.   Relevant Orders   HIV antibody   GC/Chlamydia Probe Amp   HSV(herpes simplex vrs) 1+2 ab-IgG   RPR   Hepatitis, Acute   Irritation of ear, bilateral       Will start her on oil. Call with any concerns.        Follow up plan: Return in about 3 months (around 08/09/2016) for Physical.

## 2016-05-10 NOTE — Addendum Note (Signed)
Addended by: Marshall CorkBULLOCK, Deliyah Muckle L on: 05/10/2016 04:25 PM   Modules accepted: Orders

## 2016-05-11 LAB — HSV(HERPES SIMPLEX VRS) I + II AB-IGG
HSV 1 GLYCOPROTEIN G AB, IGG: 25.2 {index} — AB (ref 0.00–0.90)
HSV 2 GLYCOPROTEIN G AB, IGG: 11.5 {index} — AB (ref 0.00–0.90)

## 2016-05-11 LAB — HEPATITIS PANEL, ACUTE
HEP A IGM: NEGATIVE
Hep B C IgM: NEGATIVE
Hepatitis B Surface Ag: NEGATIVE

## 2016-05-11 LAB — HIV ANTIBODY (ROUTINE TESTING W REFLEX): HIV SCREEN 4TH GENERATION: NONREACTIVE

## 2016-05-11 LAB — RPR: RPR: NONREACTIVE

## 2016-05-14 ENCOUNTER — Telehealth: Payer: Self-pay | Admitting: Family Medicine

## 2016-05-14 LAB — GC/CHLAMYDIA PROBE AMP
CHLAMYDIA, DNA PROBE: NEGATIVE
Neisseria gonorrhoeae by PCR: NEGATIVE

## 2016-05-14 NOTE — Telephone Encounter (Signed)
Called patient, left message for patient to return my call.

## 2016-05-14 NOTE — Telephone Encounter (Signed)
Please let her know that her labs came back negative except the cold sore and herpes (which she already knew about) Thanks!

## 2016-05-15 NOTE — Telephone Encounter (Signed)
Patient notified

## 2016-05-28 ENCOUNTER — Telehealth: Payer: Self-pay | Admitting: Family Medicine

## 2016-05-28 MED ORDER — CITALOPRAM HYDROBROMIDE 40 MG PO TABS: 60.0000 mg | ORAL_TABLET | Freq: Every day | ORAL | 3 refills | Status: DC

## 2016-05-28 NOTE — Telephone Encounter (Signed)
Rx sent to her local pharmacy.

## 2016-06-06 ENCOUNTER — Other Ambulatory Visit: Payer: Self-pay | Admitting: Family Medicine

## 2016-06-22 ENCOUNTER — Other Ambulatory Visit: Payer: Self-pay | Admitting: Family Medicine

## 2016-06-25 ENCOUNTER — Encounter: Payer: Self-pay | Admitting: Unknown Physician Specialty

## 2016-06-29 ENCOUNTER — Encounter: Payer: Self-pay | Admitting: Family Medicine

## 2016-07-03 ENCOUNTER — Ambulatory Visit (INDEPENDENT_AMBULATORY_CARE_PROVIDER_SITE_OTHER): Payer: BLUE CROSS/BLUE SHIELD | Admitting: Family Medicine

## 2016-07-03 ENCOUNTER — Encounter: Payer: Self-pay | Admitting: Family Medicine

## 2016-07-03 VITALS — BP 136/86 | HR 82 | Temp 98.7°F | Ht 61.0 in | Wt 169.8 lb

## 2016-07-03 DIAGNOSIS — Z0001 Encounter for general adult medical examination with abnormal findings: Secondary | ICD-10-CM

## 2016-07-03 DIAGNOSIS — Z1231 Encounter for screening mammogram for malignant neoplasm of breast: Secondary | ICD-10-CM

## 2016-07-03 DIAGNOSIS — Z13220 Encounter for screening for lipoid disorders: Secondary | ICD-10-CM

## 2016-07-03 DIAGNOSIS — Z0000 Encounter for general adult medical examination without abnormal findings: Secondary | ICD-10-CM

## 2016-07-03 DIAGNOSIS — Z1239 Encounter for other screening for malignant neoplasm of breast: Secondary | ICD-10-CM

## 2016-07-03 DIAGNOSIS — R739 Hyperglycemia, unspecified: Secondary | ICD-10-CM

## 2016-07-03 DIAGNOSIS — E038 Other specified hypothyroidism: Secondary | ICD-10-CM

## 2016-07-03 DIAGNOSIS — I10 Essential (primary) hypertension: Secondary | ICD-10-CM

## 2016-07-03 DIAGNOSIS — J0101 Acute recurrent maxillary sinusitis: Secondary | ICD-10-CM

## 2016-07-03 MED ORDER — AMOXICILLIN-POT CLAVULANATE 875-125 MG PO TABS: 1.0000 | ORAL_TABLET | Freq: Two times a day (BID) | ORAL | 0 refills | Status: DC

## 2016-07-03 NOTE — Patient Instructions (Signed)
Follow up as scheduled.  

## 2016-07-03 NOTE — Progress Notes (Signed)
BP 136/86 (BP Location: Right Arm, Patient Position: Sitting, Cuff Size: Normal)   Pulse 82   Temp 98.7 F (37.1 C)   Ht 5\' 1"  (1.549 m)   Wt 169 lb 12.8 oz (77 kg)   SpO2 100%   BMI 32.08 kg/m    Subjective:    Patient ID: Pamela Avila, female    DOB: May 16, 1971, 45 y.o.   MRN: [id number]  HPI: Pamela Avila is a 45 y.o. female presenting on 07/03/2016 for comprehensive medical examination. Current medical complaints include:has work paperwork that needs to be filled out  Taking metoprolol daily without side effects. BPs have been WNL when checked. Denies CP, SOB, dizziness. Taking thyroid medicine daily, having some issues with dry skin and constipation lately so wondering how her levels are doing.   Moods are doing ok with celexa. Doing a bit better with the sex addiction. Now practicing BDSM with her husband which is helping her cope with things. States she has bruises   URI sxs x 2-3 weeks with productive cough, sinus HAs, congestion, and sore throat. Taking tylenol, allergy medicine, alka seltzer plus. ENT has recommended sinus surgery for her d/t deviated septum and recurrent sinus infections but she is not ready yet.   Depression Screen done today and results listed below:  Depression screen Pamela Avila 2/9 07/03/2016 02/10/2016  Decreased Interest 0 1  Down, Depressed, Hopeless 0 0  PHQ - 2 Score 0 1  Altered sleeping 3 -  Tired, decreased energy 2 -  Change in appetite 0 -  Feeling bad or failure about yourself  0 -  Trouble concentrating 2 -  Moving slowly or fidgety/restless 0 -  Suicidal thoughts 0 -  PHQ-9 Score 7 -    The patient does not have a history of falls. I did not complete a risk assessment for falls. A plan of care for falls was not documented.   Past Medical History:  Past Medical History:  Diagnosis Date  . ADHD (attention deficit hyperactivity disorder)   . Anemia   . Anxiety   . Anxiety disorder   . Depression   . Family history of diabetes  mellitus   . GERD (gastroesophageal reflux disease)   . HSV (herpes simplex virus) infection   . Hypertension   . Hypothyroidism   . IBS (irritable bowel syndrome)   . Thyroid disease    hypothroidsim    Surgical History:  Past Surgical History:  Procedure Laterality Date  . BREAST SURGERY Right    lumpectomy  . CESAREAN SECTION     x 2  . CRYOTHERAPY  20 + years ago   cervix  . DILITATION & CURRETTAGE/HYSTROSCOPY WITH NOVASURE ABLATION N/A 04/11/2015   Procedure: DILATATION & CURETTAGE/HYSTEROSCOPY WITH NOVASURE ABLATION;  Surgeon: Herold Harms, MD;  Location: [facility] ORS;  Service: Gynecology;  Laterality: N/A;  . ESSURE TUBAL LIGATION    . lumpectomy Right   . TONSILLECTOMY    . TUBAL LIGATION      Medications:  Current Outpatient Prescriptions on File Prior to Visit  Medication Sig  . amphetamine-dextroamphetamine (ADDERALL) 7.5 MG tablet Take 1 tablet by mouth 2 (two) times daily as needed.  Pamela Avila atomoxetine (STRATTERA) 80 MG capsule Take 1 capsule (80 mg total) by mouth daily.  . citalopram (CELEXA) 40 MG tablet Take 1.5 tablets (60 mg total) by mouth daily.  Pamela Avila levothyroxine (SYNTHROID, LEVOTHROID) 88 MCG tablet Take 1 tablet (88 mcg total) by mouth daily before breakfast.  .  metoprolol succinate (TOPROL-XL) 25 MG 24 hr tablet Take 1.5 tablets (37.5 mg total) by mouth daily.  . Probiotic Product (PROBIOTIC PO) Take by mouth daily.   No current facility-administered medications on file prior to visit.     Allergies:  Allergies  Allergen Reactions  . Erythromycin Itching    Social History:  Social History   Social History  . Marital status: Married    Spouse name: N/A  . Number of children: N/A  . Years of education: N/A   Occupational History  . Not on file.   Social History Main Topics  . Smoking status: Light Tobacco Smoker    Packs/day: 0.25    Types: Cigarettes  . Smokeless tobacco: Never Used  . Alcohol use Yes     Comment: occasional  .  Drug use:     Types: Marijuana     Comment: Occasional  . Sexual activity: Yes   Other Topics Concern  . Not on file   Social History Narrative  . No narrative on file   History  Smoking Status  . Light Tobacco Smoker  . Packs/day: 0.25  . Types: Cigarettes  Smokeless Tobacco  . Never Used   History  Alcohol Use  . Yes    Comment: occasional    Family History:  Family History  Problem Relation Age of Onset  . Hypothyroidism Paternal Grandfather   . Breast cancer Paternal Grandmother   . Cancer Paternal Grandmother     breast  . Breast cancer Maternal Grandmother   . Hyperlipidemia Maternal Grandfather   . Diabetes Father   . Obesity Father   . Hypertension Father   . Hyperlipidemia Father   . Ovarian cancer Paternal Aunt     ?  Pamela Avila. Cancer Paternal Aunt     breast and cervical  . Breast cancer Paternal Aunt   . Hypothyroidism Mother   . Breast cancer Son   . Cancer Son     breast  . Colon cancer Neg Hx     Past medical history, surgical history, medications, allergies, family history and social history reviewed with patient today and changes made to appropriate areas of the chart.   Review of Systems - General ROS: negative Psychological ROS: negative Ophthalmic ROS: negative ENT ROS: positive for - headaches, nasal congestion, sinus pain and sore throat Breast ROS: negative for breast lumps Respiratory ROS: negative Cardiovascular ROS: positive for - cough Gastrointestinal ROS: no abdominal pain, change in bowel habits, or black or bloody stools Genito-Urinary ROS: no dysuria, trouble voiding, or hematuria Musculoskeletal ROS: negative Neurological ROS: no TIA or stroke symptoms Dermatological ROS: negative All other ROS negative except what is listed above and in the HPI.      Objective:    BP 136/86 (BP Location: Right Arm, Patient Position: Sitting, Cuff Size: Normal)   Pulse 82   Temp 98.7 F (37.1 C)   Ht 5\' 1"  (1.549 m)   Wt 169 lb 12.8 oz  (77 kg)   SpO2 100%   BMI 32.08 kg/m   Wt Readings from Last 3 Encounters:  07/03/16 169 lb 12.8 oz (77 kg)  05/10/16 167 lb (75.8 kg)  02/10/16 164 lb (74.4 kg)    Physical Exam      Assessment & Plan:   Problem List Items Addressed This Visit      Cardiovascular and Mediastinum   Essential hypertension, benign    BP stable. Continue metoprolol      Relevant Orders  CBC with Differential/Platelet   Comprehensive metabolic panel   UA/M w/rflx Culture, Routine     Endocrine   Hypothyroidism    Will check levels today and adjust as needed      Relevant Orders   TSH     Other   Hyperglycemia   Relevant Orders   HgB A1c    Other Visit Diagnoses    Annual physical exam    -  Primary   Await lab results. Forms filled out   Screening for hyperlipidemia       Relevant Orders   Lipid Panel w/o Chol/HDL Ratio   Screening for breast cancer       Relevant Orders   MM DIGITAL SCREENING BILATERAL       Follow up plan: Return for as scheduled for ADHD follow up.   LABORATORY TESTING:  - Pap smear: up to date  IMMUNIZATIONS:   - Tdap: Tetanus vaccination status reviewed: last tetanus booster within 10 years. - Influenza: Up to date  SCREENING: -Mammogram: Ordered today   PATIENT COUNSELING:   Advised to take 1 mg of folate supplement per day if capable of pregnancy.   Sexuality: Discussed sexually transmitted diseases, partner selection, use of condoms, avoidance of unintended pregnancy  and contraceptive alternatives.   Advised to avoid cigarette smoking.  I discussed with the patient that most people either abstain from alcohol or drink within safe limits (<=14/week and <=4 drinks/occasion for males, <=7/weeks and <= 3 drinks/occasion for females) and that the risk for alcohol disorders and other health effects rises proportionally with the number of drinks per week and how often a drinker exceeds daily limits.  Discussed cessation/primary prevention of  drug use and availability of treatment for abuse.   Diet: Encouraged to adjust caloric intake to maintain  or achieve ideal body weight, to reduce intake of dietary saturated fat and total fat, to limit sodium intake by avoiding high sodium foods and not adding table salt, and to maintain adequate dietary potassium and calcium preferably from fresh fruits, vegetables, and low-fat dairy products.    stressed the importance of regular exercise  Injury prevention: Discussed safety belts, safety helmets, smoke detector, smoking near bedding or upholstery.   Dental health: Discussed importance of regular tooth brushing, flossing, and dental visits.    NEXT PREVENTATIVE PHYSICAL DUE IN 1 YEAR. Return for as scheduled for ADHD follow up.

## 2016-07-03 NOTE — Assessment & Plan Note (Signed)
Will check levels today and adjust as needed

## 2016-07-03 NOTE — Assessment & Plan Note (Signed)
BP stable. Continue metoprolol

## 2016-07-04 ENCOUNTER — Encounter: Payer: Self-pay | Admitting: Family Medicine

## 2016-07-09 ENCOUNTER — Telehealth: Payer: Self-pay | Admitting: Family Medicine

## 2016-07-09 NOTE — Telephone Encounter (Signed)
Please ask patient if she got her labs drawn during her physical - possibly a mistake on our side, but it is showing up as needs to be collected. Regarding her email about weight loss, keep focusing on lifestyle modifications and let's wait and see how her lab results look (especially thyroid) before further discussing strategy.

## 2016-07-10 NOTE — Telephone Encounter (Signed)
Left message to call.

## 2016-07-11 NOTE — Telephone Encounter (Signed)
Patient stated that she did not get her labs drawn at her appointment. She will come back in for those. Advised we could discuss the weight issue more once we got her lab results back.

## 2016-07-11 NOTE — Telephone Encounter (Signed)
Left message to call.

## 2016-07-13 ENCOUNTER — Other Ambulatory Visit: Payer: BLUE CROSS/BLUE SHIELD

## 2016-07-13 DIAGNOSIS — Z13220 Encounter for screening for lipoid disorders: Secondary | ICD-10-CM | POA: Diagnosis not present

## 2016-07-13 DIAGNOSIS — E039 Hypothyroidism, unspecified: Secondary | ICD-10-CM | POA: Diagnosis not present

## 2016-07-13 DIAGNOSIS — I10 Essential (primary) hypertension: Secondary | ICD-10-CM

## 2016-07-13 DIAGNOSIS — Z833 Family history of diabetes mellitus: Secondary | ICD-10-CM | POA: Diagnosis not present

## 2016-07-14 LAB — CBC WITH DIFFERENTIAL/PLATELET
BASOS ABS: 0.1 10*3/uL (ref 0.0–0.2)
Basos: 1 %
EOS (ABSOLUTE): 0.6 10*3/uL — AB (ref 0.0–0.4)
Eos: 8 %
Hematocrit: 42.2 % (ref 34.0–46.6)
Hemoglobin: 13.6 g/dL (ref 11.1–15.9)
IMMATURE GRANS (ABS): 0 10*3/uL (ref 0.0–0.1)
Immature Granulocytes: 0 %
LYMPHS: 34 %
Lymphocytes Absolute: 2.6 10*3/uL (ref 0.7–3.1)
MCH: 30.4 pg (ref 26.6–33.0)
MCHC: 32.2 g/dL (ref 31.5–35.7)
MCV: 94 fL (ref 79–97)
Monocytes Absolute: 0.7 10*3/uL (ref 0.1–0.9)
Monocytes: 9 %
NEUTROS ABS: 3.6 10*3/uL (ref 1.4–7.0)
NEUTROS PCT: 48 %
PLATELETS: 255 10*3/uL (ref 150–379)
RBC: 4.47 x10E6/uL (ref 3.77–5.28)
RDW: 14.1 % (ref 12.3–15.4)
WBC: 7.6 10*3/uL (ref 3.4–10.8)

## 2016-07-14 LAB — COMPREHENSIVE METABOLIC PANEL
ALK PHOS: 58 IU/L (ref 39–117)
ALT: 22 IU/L (ref 0–32)
AST: 16 IU/L (ref 0–40)
Albumin/Globulin Ratio: 1.7 (ref 1.2–2.2)
Albumin: 3.8 g/dL (ref 3.5–5.5)
BILIRUBIN TOTAL: 0.2 mg/dL (ref 0.0–1.2)
BUN/Creatinine Ratio: 11 (ref 9–23)
BUN: 8 mg/dL (ref 6–24)
CHLORIDE: 105 mmol/L (ref 96–106)
CO2: 25 mmol/L (ref 18–29)
Calcium: 8.5 mg/dL — ABNORMAL LOW (ref 8.7–10.2)
Creatinine, Ser: 0.72 mg/dL (ref 0.57–1.00)
GFR calc Af Amer: 118 mL/min/{1.73_m2} (ref >59)
GFR calc non Af Amer: 102 mL/min/{1.73_m2} (ref >59)
GLUCOSE: 81 mg/dL (ref 65–99)
Globulin, Total: 2.3 g/dL (ref 1.5–4.5)
Potassium: 4.5 mmol/L (ref 3.5–5.2)
Sodium: 140 mmol/L (ref 134–144)
TOTAL PROTEIN: 6.1 g/dL (ref 6.0–8.5)

## 2016-07-14 LAB — TSH: TSH: 1.49 u[IU]/mL (ref 0.450–4.500)

## 2016-07-14 LAB — LIPID PANEL W/O CHOL/HDL RATIO
CHOLESTEROL TOTAL: 170 mg/dL (ref 100–199)
HDL: 53 mg/dL (ref >39)
LDL Calculated: 96 mg/dL (ref 0–99)
TRIGLYCERIDES: 104 mg/dL (ref 0–149)
VLDL CHOLESTEROL CAL: 21 mg/dL (ref 5–40)

## 2016-07-14 LAB — HEMOGLOBIN A1C
ESTIMATED AVERAGE GLUCOSE: 100 mg/dL
Hgb A1c MFr Bld: 5.1 % (ref 4.8–5.6)

## 2016-07-17 ENCOUNTER — Telehealth: Payer: Self-pay | Admitting: Family Medicine

## 2016-07-18 NOTE — Telephone Encounter (Signed)
error 

## 2016-07-19 ENCOUNTER — Other Ambulatory Visit: Payer: Self-pay | Admitting: Family Medicine

## 2016-07-19 ENCOUNTER — Encounter: Payer: Self-pay | Admitting: Family Medicine

## 2016-07-19 MED ORDER — DOXYCYCLINE HYCLATE 100 MG PO TABS: 100.0000 mg | ORAL_TABLET | Freq: Two times a day (BID) | ORAL | 0 refills | Status: DC

## 2016-08-02 ENCOUNTER — Encounter: Payer: BLUE CROSS/BLUE SHIELD | Admitting: Family Medicine

## 2016-08-03 ENCOUNTER — Other Ambulatory Visit: Payer: Self-pay | Admitting: Family Medicine

## 2016-08-03 ENCOUNTER — Encounter: Payer: Self-pay | Admitting: Family Medicine

## 2016-08-08 ENCOUNTER — Other Ambulatory Visit: Payer: Self-pay | Admitting: Family Medicine

## 2016-08-08 MED ORDER — LEVOTHYROXINE SODIUM 88 MCG PO TABS: 88.0000 ug | ORAL_TABLET | Freq: Every day | ORAL | 2 refills | Status: DC

## 2016-08-08 MED ORDER — METOPROLOL SUCCINATE ER 25 MG PO TB24: 37.5000 mg | ORAL_TABLET | Freq: Every day | ORAL | 2 refills | Status: DC

## 2016-08-08 MED ORDER — ATOMOXETINE HCL 80 MG PO CAPS: 80.0000 mg | ORAL_CAPSULE | Freq: Every day | ORAL | 1 refills | Status: DC

## 2016-08-10 ENCOUNTER — Other Ambulatory Visit: Payer: Self-pay | Admitting: Family Medicine

## 2016-08-10 MED ORDER — CITALOPRAM HYDROBROMIDE 40 MG PO TABS: 60.0000 mg | ORAL_TABLET | Freq: Every day | ORAL | 1 refills | Status: DC

## 2016-08-15 ENCOUNTER — Encounter: Payer: BLUE CROSS/BLUE SHIELD | Admitting: Family Medicine

## 2016-08-16 ENCOUNTER — Ambulatory Visit: Payer: BLUE CROSS/BLUE SHIELD | Admitting: Family Medicine

## 2016-08-23 ENCOUNTER — Other Ambulatory Visit: Payer: Self-pay | Admitting: Family Medicine

## 2016-08-23 MED ORDER — OSELTAMIVIR PHOSPHATE 75 MG PO CAPS: 75.0000 mg | ORAL_CAPSULE | Freq: Every day | ORAL | 0 refills | Status: DC

## 2016-08-24 ENCOUNTER — Encounter: Payer: Self-pay | Admitting: Family Medicine

## 2016-08-24 ENCOUNTER — Ambulatory Visit (INDEPENDENT_AMBULATORY_CARE_PROVIDER_SITE_OTHER): Payer: BLUE CROSS/BLUE SHIELD | Admitting: Family Medicine

## 2016-08-24 VITALS — BP 119/82 | HR 97 | Temp 98.3°F | Wt 170.6 lb

## 2016-08-24 DIAGNOSIS — Z720 Tobacco use: Secondary | ICD-10-CM | POA: Diagnosis not present

## 2016-08-24 DIAGNOSIS — F901 Attention-deficit hyperactivity disorder, predominantly hyperactive type: Secondary | ICD-10-CM

## 2016-08-24 MED ORDER — AMPHETAMINE-DEXTROAMPHETAMINE 7.5 MG PO TABS: 1.0000 | ORAL_TABLET | Freq: Two times a day (BID) | ORAL | 0 refills | Status: DC | PRN

## 2016-08-24 MED ORDER — BUPROPION HCL ER (SR) 150 MG PO TB12: ORAL_TABLET | ORAL | 3 refills | Status: DC

## 2016-08-24 NOTE — Progress Notes (Signed)
BP 119/82 (BP Location: Left Arm, Patient Position: Sitting, Cuff Size: Large)   Pulse 97   Temp 98.3 F (36.8 C)   Wt 170 lb 9.6 oz (77.4 kg)   SpO2 99%   BMI 32.23 kg/m    Subjective:    Patient ID: Pamela Avila, female    DOB: February 02, 1971, 46 y.o.   MRN: [id number]  HPI: Pamela Avila is a 46 y.o. female  Chief Complaint  Patient presents with  . ADHD   ADHD FOLLOW UP ADHD status: stable Satisfied with current therapy: no Medication compliance:  excellent compliance Controlled substance contract: yes Previous psychiatry evaluation: no Previous medications: yes    Taking meds on weekends/vacations: occasionally Work/school performance:  good Difficulty sustaining attention/completing tasks: no Distracted by extraneous stimuli: yes Does not listen when spoken to: yes  Fidgets with hands or feet: no Unable to stay in seat: no Blurts out/interrupts others: no ADHD Medication Side Effects: no    Decreased appetite: no    Headache: no    Sleeping disturbance pattern: no    Irritability: no    Rebound effects (worse than baseline) off medication: no    Anxiousness: no    Dizziness: no    Tics: no  SMOKING CESSATION Smoking Status: current every day smoker Smoking Amount: 1+ppd Smoking Quit Date: not set Smoking triggers: driving, stress, coffee Type of tobacco use: cigarettes Children in the house: no Other household members who smoke: no Treatments attempted: cold [place], wellbutrin Pneumovax: declined   Relevant past medical, surgical, family and social history reviewed and updated as indicated. Interim medical history since our last visit reviewed. Allergies and medications reviewed and updated.  Review of Systems  Constitutional: Negative.   Respiratory: Negative.   Cardiovascular: Negative.   Psychiatric/Behavioral: Positive for decreased concentration. Negative for agitation, behavioral problems, confusion, dysphoric mood, hallucinations, self-injury,  sleep disturbance and suicidal ideas. The patient is not nervous/anxious and is not hyperactive.     Per HPI unless specifically indicated above     Objective:    BP 119/82 (BP Location: Left Arm, Patient Position: Sitting, Cuff Size: Large)   Pulse 97   Temp 98.3 F (36.8 C)   Wt 170 lb 9.6 oz (77.4 kg)   SpO2 99%   BMI 32.23 kg/m   Wt Readings from Last 3 Encounters:  08/24/16 170 lb 9.6 oz (77.4 kg)  07/03/16 169 lb 12.8 oz (77 kg)  05/10/16 167 lb (75.8 kg)    Physical Exam  Constitutional: She is oriented to person, place, and time. She appears well-developed and well-nourished. No distress.  HENT:  Head: Normocephalic and atraumatic.  Right Ear: Hearing normal.  Left Ear: Hearing normal.  Nose: Nose normal.  Eyes: Conjunctivae and lids are normal. Right eye exhibits no discharge. Left eye exhibits no discharge. No scleral icterus.  Cardiovascular: Normal rate, regular rhythm, normal heart sounds and intact distal pulses.  Exam reveals no gallop and no friction rub.   No murmur heard. Pulmonary/Chest: Effort normal and breath sounds normal. No respiratory distress. She has no wheezes. She has no rales. She exhibits no tenderness.  Musculoskeletal: Normal range of motion.  Neurological: She is alert and oriented to person, place, and time.  Skin: Skin is warm, dry and intact. No rash noted. No erythema. No pallor.  Psychiatric: She has a normal mood and affect. Her speech is normal and behavior is normal. Judgment and thought content normal. Cognition and memory are normal.  Nursing note  and vitals reviewed.   Results for orders placed or performed in visit on 07/13/16  CBC with Differential/Platelet  Result Value Ref Range   WBC 7.6 3.4 - 10.8 x10E3/uL   RBC 4.47 3.77 - 5.28 x10E6/uL   Hemoglobin 13.6 11.1 - 15.9 g/dL   Hematocrit 96.042.2 45.434.0 - 46.6 %   MCV 94 79 - 97 fL   MCH 30.4 26.6 - 33.0 pg   MCHC 32.2 31.5 - 35.7 g/dL   RDW 09.814.1 11.912.3 - 14.715.4 %   Platelets  255 150 - 379 x10E3/uL   Neutrophils 48 Not Estab. %   Lymphs 34 Not Estab. %   Monocytes 9 Not Estab. %   Eos 8 Not Estab. %   Basos 1 Not Estab. %   Neutrophils Absolute 3.6 1.4 - 7.0 x10E3/uL   Lymphocytes Absolute 2.6 0.7 - 3.1 x10E3/uL   Monocytes Absolute 0.7 0.1 - 0.9 x10E3/uL   EOS (ABSOLUTE) 0.6 (H) 0.0 - 0.4 x10E3/uL   Basophils Absolute 0.1 0.0 - 0.2 x10E3/uL   Immature Granulocytes 0 Not Estab. %   Immature Grans (Abs) 0.0 0.0 - 0.1 x10E3/uL  Comprehensive metabolic panel  Result Value Ref Range   Glucose 81 65 - 99 mg/dL   BUN 8 6 - 24 mg/dL   Creatinine, Ser 8.290.72 0.57 - 1.00 mg/dL   GFR calc non Af Amer 102 >59 mL/min/1.73   GFR calc Af Amer 118 >59 mL/min/1.73   BUN/Creatinine Ratio 11 9 - 23   Sodium 140 134 - 144 mmol/L   Potassium 4.5 3.5 - 5.2 mmol/L   Chloride 105 96 - 106 mmol/L   CO2 25 18 - 29 mmol/L   Calcium 8.5 (L) 8.7 - 10.2 mg/dL   Total Protein 6.1 6.0 - 8.5 g/dL   Albumin 3.8 3.5 - 5.5 g/dL   Globulin, Total 2.3 1.5 - 4.5 g/dL   Albumin/Globulin Ratio 1.7 1.2 - 2.2   Bilirubin Total 0.2 0.0 - 1.2 mg/dL   Alkaline Phosphatase 58 39 - 117 IU/L   AST 16 0 - 40 IU/L   ALT 22 0 - 32 IU/L  Lipid Panel w/o Chol/HDL Ratio  Result Value Ref Range   Cholesterol, Total 170 100 - 199 mg/dL   Triglycerides 562104 0 - 149 mg/dL   HDL 53 >13>39 mg/dL   VLDL Cholesterol Cal 21 5 - 40 mg/dL   LDL Calculated 96 0 - 99 mg/dL  TSH  Result Value Ref Range   TSH 1.490 0.450 - 4.500 uIU/mL  HgB A1c  Result Value Ref Range   Hgb A1c MFr Bld 5.1 4.8 - 5.6 %   Est. average glucose Bld gHb Est-mCnc 100 mg/dL      Assessment & Plan:   Problem List Items Addressed This Visit      Other   ADHD (attention deficit hyperactivity disorder) - Primary    Stable. Refills for 3 months given today. Continue to monitor. Call with any concerns. Will do TOVA next visit.       Tobacco user    Would like to try wellbutrin to try to quit. Rx given today.          Follow  up plan: Return in about 3 months (around 11/22/2016) for ADD follow up with TOVA.

## 2016-08-24 NOTE — Assessment & Plan Note (Signed)
Would like to try wellbutrin to try to quit. Rx given today.

## 2016-08-24 NOTE — Assessment & Plan Note (Signed)
Stable. Refills for 3 months given today. Continue to monitor. Call with any concerns. Will do TOVA next visit.

## 2016-08-31 ENCOUNTER — Encounter: Payer: Self-pay | Admitting: Family Medicine

## 2016-08-31 ENCOUNTER — Other Ambulatory Visit: Payer: Self-pay | Admitting: Family Medicine

## 2016-09-20 ENCOUNTER — Other Ambulatory Visit: Payer: Self-pay | Admitting: Family Medicine

## 2016-10-17 ENCOUNTER — Encounter: Payer: Self-pay | Admitting: Family Medicine

## 2016-10-17 ENCOUNTER — Ambulatory Visit (INDEPENDENT_AMBULATORY_CARE_PROVIDER_SITE_OTHER): Payer: BLUE CROSS/BLUE SHIELD | Admitting: Family Medicine

## 2016-10-17 VITALS — BP 123/85 | HR 88 | Temp 98.5°F | Wt 172.0 lb

## 2016-10-17 DIAGNOSIS — R109 Unspecified abdominal pain: Secondary | ICD-10-CM

## 2016-10-17 LAB — CBC WITH DIFFERENTIAL/PLATELET
HEMATOCRIT: 44.2 % (ref 34.0–46.6)
Hemoglobin: 15.7 g/dL (ref 11.1–15.9)
LYMPHS ABS: 3.5 10*3/uL — AB (ref 0.7–3.1)
LYMPHS: 32 %
MCH: 31.8 pg (ref 26.6–33.0)
MCHC: 35.5 g/dL (ref 31.5–35.7)
MCV: 90 fL (ref 79–97)
MID (ABSOLUTE): 2.3 10*3/uL — AB (ref 0.1–1.6)
MID: 21 %
Neutrophils Absolute: 5 10*3/uL (ref 1.4–7.0)
Neutrophils: 46 %
PLATELETS: 296 10*3/uL (ref 150–379)
RBC: 4.93 x10E6/uL (ref 3.77–5.28)
RDW: 13.5 % (ref 12.3–15.4)
WBC: 10.8 10*3/uL (ref 3.4–10.8)

## 2016-10-17 LAB — UA/M W/RFLX CULTURE, ROUTINE
BILIRUBIN UA: NEGATIVE
GLUCOSE, UA: NEGATIVE
Ketones, UA: NEGATIVE
Leukocytes, UA: NEGATIVE
NITRITE UA: NEGATIVE
PH UA: 5.5 (ref 5.0–7.5)
PROTEIN UA: NEGATIVE
RBC UA: NEGATIVE
Specific Gravity, UA: 1.025 (ref 1.005–1.030)
UUROB: 0.2 mg/dL (ref 0.2–1.0)

## 2016-10-17 LAB — MICROSCOPIC EXAMINATION
BACTERIA UA: NONE SEEN
RBC, UA: NONE SEEN /hpf (ref 0–?)
WBC, UA: NONE SEEN /hpf (ref 0–?)

## 2016-10-17 LAB — VERITOR FLU A/B WAIVED
INFLUENZA A: NEGATIVE
INFLUENZA B: NEGATIVE

## 2016-10-17 MED ORDER — DICYCLOMINE HCL 10 MG PO CAPS: 10.0000 mg | ORAL_CAPSULE | Freq: Three times a day (TID) | ORAL | 0 refills | Status: DC

## 2016-10-17 MED ORDER — OMEPRAZOLE 20 MG PO CPDR: 20.0000 mg | DELAYED_RELEASE_CAPSULE | Freq: Every day | ORAL | 3 refills | Status: DC

## 2016-10-17 NOTE — Patient Instructions (Signed)
Follow up as needed

## 2016-10-17 NOTE — Progress Notes (Signed)
   BP 123/85   Pulse 88   Temp 98.5 F (36.9 C)   Wt 172 lb (78 kg)   SpO2 100%   BMI 32.50 kg/m    Subjective:    Patient ID: Pamela Avila, female    DOB: 1970-10-22, 46 y.o.   MRN: [id number]  HPI: Pamela Avila is a 46 y.o. female  Chief Complaint  Patient presents with  . Abdominal Pain    x 1 week, some nausea, diarrhea. Cramping, some spasms in her upper stomach. Nothing seems to make it better or worse.    About a week of loose stools, abdominal cramping, fatigue, chills. Started to get better after 2 days, but then yesterday started back with symptoms. Feels bloated, gassy. Seems to be worse after eating, will have to use the bathroom shortly after eating. Denies fever, bloody stools, vomiting, or urinary symptoms. Does complain of some low back pain this morning though. Has been eating bland foods. Has been taking tylenol and ibuprofen, and tried some TUMS with no great relief. No recent travel or new foods or medications.   Relevant past medical, surgical, family and social history reviewed and updated as indicated. Interim medical history since our last visit reviewed. Allergies and medications reviewed and updated.  Review of Systems  Constitutional: Positive for chills and fatigue.  HENT: Negative.   Respiratory: Negative.   Cardiovascular: Negative.   Gastrointestinal: Positive for abdominal pain, diarrhea and nausea.  Genitourinary: Negative.   Musculoskeletal: Positive for back pain.  Neurological: Negative.   Psychiatric/Behavioral: Negative.     Per HPI unless specifically indicated above     Objective:    BP 123/85   Pulse 88   Temp 98.5 F (36.9 C)   Wt 172 lb (78 kg)   SpO2 100%   BMI 32.50 kg/m   Wt Readings from Last 3 Encounters:  10/17/16 172 lb (78 kg)  08/24/16 170 lb 9.6 oz (77.4 kg)  07/03/16 169 lb 12.8 oz (77 kg)    Physical Exam  Constitutional: She is oriented to person, place, and time. She appears well-developed and  well-nourished. No distress.  HENT:  Head: Atraumatic.  Eyes: Conjunctivae are normal. Pupils are equal, round, and reactive to light.  Neck: Normal range of motion. Neck supple.  Cardiovascular: Normal rate and normal heart sounds.   Pulmonary/Chest: Effort normal and breath sounds normal.  Abdominal: Soft. Bowel sounds are normal. She exhibits no distension and no mass. There is tenderness (mild generalized ttp). There is no guarding.  - murphy's sign  Musculoskeletal: Normal range of motion.  Neurological: She is alert and oriented to person, place, and time.  Skin: Skin is warm and dry.  Psychiatric: She has a normal mood and affect. Her behavior is normal.  Nursing note and vitals reviewed.     Assessment & Plan:   Problem List Items Addressed This Visit    None    Visit Diagnoses    Abdominal cramping    -  Primary   U/A, rapid flu, and CBC unconcerning. Suspect viral or IBS related. Will try bentyl, prilosec, and BRAT diet. Push fluids. F/u if no improvement   Relevant Orders   CBC With Differential/Platelet (Completed)   UA/M w/rflx Culture, Routine (Completed)   Veritor Flu A/B Waived       Follow up plan: Return if symptoms worsen or fail to improve.

## 2016-10-18 ENCOUNTER — Encounter: Payer: Self-pay | Admitting: Family Medicine

## 2016-10-18 NOTE — Telephone Encounter (Signed)
Routing to provider  

## 2016-10-24 ENCOUNTER — Other Ambulatory Visit: Payer: Self-pay | Admitting: Family Medicine

## 2016-10-24 MED ORDER — CIPROFLOXACIN HCL 500 MG PO TABS: 500.0000 mg | ORAL_TABLET | Freq: Two times a day (BID) | ORAL | 0 refills | Status: DC

## 2016-10-29 ENCOUNTER — Other Ambulatory Visit: Payer: Self-pay | Admitting: Family Medicine

## 2016-10-29 DIAGNOSIS — R109 Unspecified abdominal pain: Secondary | ICD-10-CM

## 2016-10-30 ENCOUNTER — Encounter: Payer: Self-pay | Admitting: Gastroenterology

## 2016-11-16 ENCOUNTER — Encounter: Payer: Self-pay | Admitting: Family Medicine

## 2016-11-19 ENCOUNTER — Encounter: Payer: Self-pay | Admitting: Family Medicine

## 2016-11-19 ENCOUNTER — Ambulatory Visit (INDEPENDENT_AMBULATORY_CARE_PROVIDER_SITE_OTHER): Payer: BLUE CROSS/BLUE SHIELD | Admitting: Family Medicine

## 2016-11-19 VITALS — BP 118/74 | HR 105 | Temp 98.6°F | Ht 61.0 in | Wt 169.8 lb

## 2016-11-19 DIAGNOSIS — F901 Attention-deficit hyperactivity disorder, predominantly hyperactive type: Secondary | ICD-10-CM | POA: Diagnosis not present

## 2016-11-19 DIAGNOSIS — F419 Anxiety disorder, unspecified: Secondary | ICD-10-CM

## 2016-11-19 MED ORDER — METOPROLOL SUCCINATE ER 25 MG PO TB24: 37.5000 mg | ORAL_TABLET | Freq: Every day | ORAL | 2 refills | Status: DC

## 2016-11-19 MED ORDER — AMPHETAMINE-DEXTROAMPHETAMINE 10 MG PO TABS: 10.0000 mg | ORAL_TABLET | Freq: Two times a day (BID) | ORAL | 0 refills | Status: DC

## 2016-11-19 MED ORDER — LEVOTHYROXINE SODIUM 88 MCG PO TABS: 88.0000 ug | ORAL_TABLET | Freq: Every day | ORAL | 2 refills | Status: DC

## 2016-11-19 MED ORDER — CITALOPRAM HYDROBROMIDE 40 MG PO TABS: 60.0000 mg | ORAL_TABLET | Freq: Every day | ORAL | 1 refills | Status: DC

## 2016-11-19 MED ORDER — OMEPRAZOLE 20 MG PO CPDR: 20.0000 mg | DELAYED_RELEASE_CAPSULE | Freq: Every day | ORAL | 3 refills | Status: DC

## 2016-11-19 MED ORDER — ATOMOXETINE HCL 80 MG PO CAPS: 80.0000 mg | ORAL_CAPSULE | Freq: Every day | ORAL | 1 refills | Status: DC

## 2016-11-19 MED ORDER — BUSPIRONE HCL 5 MG PO TABS: 5.0000 mg | ORAL_TABLET | Freq: Three times a day (TID) | ORAL | 1 refills | Status: DC | PRN

## 2016-11-19 NOTE — Assessment & Plan Note (Signed)
Exacerbated with social issues right now. Will continue celexa and add buspar. Call with any concerns.

## 2016-11-19 NOTE — Assessment & Plan Note (Signed)
Not under good control, will increase her adderall to  and recheck in 1 month. Call with any concerns.

## 2016-11-19 NOTE — Progress Notes (Signed)
BP 118/74 (BP Location: Left Arm, Patient Position: Sitting, Cuff Size: Large)   Pulse (!) 105   Temp 98.6 F (37 C) (Oral)   Ht  (1.549 m)   Wt 169 lb 12.8 oz (77 kg)   SpO2 99%   BMI 32.08 kg/m    Subjective:    Patient ID: Pamela Avila, female    DOB: [birth year], 46 y.o.   MRN: [id number]  HPI: Pamela Avila is a 46 y.o. female  Chief Complaint  Patient presents with  . ADHD    Tova   Son got arrested for trafficing drugs recently. She has been under a lot of stress with that and her celexa is not cutting it.   ADHD FOLLOW UP- took TOVA today, see scanned copy under documents- not doing great. She notes that she is taking the adderall BID at night. She is not satisfied with her current regimen. She feels like she has been making some mistakes at work ADHD status: worse Satisfied with current therapy: no Medication compliance:  excellent compliance Controlled substance contract: yes Previous psychiatry evaluation: no Previous medications: yes adderall   Taking meds on weekends/vacations: yes Work/school performance:  average Difficulty sustaining attention/completing tasks: yes Distracted by extraneous stimuli: yes Does not listen when spoken to: yes  Fidgets with hands or feet: no Unable to stay in seat: no Blurts out/interrupts others: yes ADHD Medication Side Effects: yes    Decreased appetite: no    Headache: no    Sleeping disturbance pattern: no    Irritability: no    Rebound effects (worse than baseline) off medication: no    Anxiousness: yes    Dizziness: no    Tics: no  Relevant past medical, surgical, family and social history reviewed and updated as indicated. Interim medical history since our last visit reviewed. Allergies and medications reviewed and updated.  Review of Systems  Constitutional: Negative.   Respiratory: Negative.   Cardiovascular: Negative.   Psychiatric/Behavioral: Negative for agitation, behavioral problems, confusion,  decreased concentration, dysphoric mood, hallucinations, self-injury, sleep disturbance and suicidal ideas. The patient is nervous/anxious and is hyperactive.     Per HPI unless specifically indicated above     Objective:    BP 118/74 (BP Location: Left Arm, Patient Position: Sitting, Cuff Size: Large)   Pulse (!) 105   Temp 98.6 F (37 C) (Oral)   Ht  (1.549 m)   Wt 169 lb 12.8 oz (77 kg)   SpO2 99%   BMI 32.08 kg/m   Wt Readings from Last 3 Encounters:  11/19/16 169 lb 12.8 oz (77 kg)  10/17/16 172 lb (78 kg)  08/24/16 170 lb 9.6 oz (77.4 kg)    Physical Exam  Constitutional: She is oriented to person, place, and time. She appears well-developed and well-nourished. No distress.  HENT:  Head: Normocephalic and atraumatic.  Right Ear: Hearing normal.  Left Ear: Hearing normal.  Nose: Nose normal.  Eyes: Conjunctivae and lids are normal. Right eye exhibits no discharge. Left eye exhibits no discharge. No scleral icterus.  Pulmonary/Chest: Effort normal. No respiratory distress.  Musculoskeletal: Normal range of motion.  Neurological: She is alert and oriented to person, place, and time.  Skin: Skin is intact. No rash noted.  Psychiatric: She has a normal mood and affect. Her speech is normal and behavior is normal. Judgment and thought content normal. Cognition and memory are normal.    Results for orders placed or performed in visit on 10/17/16  Veritor Flu A/B Waived  Result Value Ref Range   Influenza A Negative Negative   Influenza B Negative Negative  Microscopic Examination  Result Value Ref Range   WBC, UA None seen 0 - 5 /hpf   RBC, UA None seen 0 - 2 /hpf   Epithelial Cells (non renal) 0-10 0 - 10 /hpf   Crystals Present (A) N/A   Crystal Type Amorphous Sediment N/A   Bacteria, UA None seen None seen/Few  CBC With Differential/Platelet  Result Value Ref Range   WBC 10.8 3.4 - 10.8 x10E3/uL   RBC 4.93 3.77 - 5.28 x10E6/uL   Hemoglobin 15.7 11.1 - 15.9  g/dL   Hematocrit 40.9 81.1 - 46.6 %   MCV 90 79 - 97 fL   MCH 31.8 26.6 - 33.0 pg   MCHC 35.5 31.5 - 35.7 g/dL   RDW 91.4 78.2 - 95.6 %   Platelets 296 150 - 379 x10E3/uL   Neutrophils 46 Not Estab. %   Lymphs 32 Not Estab. %   MID 21 Not Estab. %   Neutrophils Absolute 5.0 1.4 - 7.0 x10E3/uL   Lymphocytes Absolute 3.5 (H) 0.7 - 3.1 x10E3/uL   MID (Absolute) 2.3 (H) 0.1 - 1.6 X10E3/uL  UA/M w/rflx Culture, Routine  Result Value Ref Range   Specific Gravity, UA 1.025 1.005 - 1.030   pH, UA 5.5 5.0 - 7.5   Color, UA Yellow Yellow   Appearance Ur Clear Clear   Leukocytes, UA Negative Negative   Protein, UA Negative Negative/Trace   Glucose, UA Negative Negative   Ketones, UA Negative Negative   RBC, UA Negative Negative   Bilirubin, UA Negative Negative   Urobilinogen, Ur 0.2 0.2 - 1.0 mg/dL   Nitrite, UA Negative Negative   Microscopic Examination See below:       Assessment & Plan:   Problem List Items Addressed This Visit      Other   ADHD (attention deficit hyperactivity disorder) - Primary    Not under good control, will increase her adderall to  and recheck in 1 month. Call with any concerns.       Anxiety    Exacerbated with social issues right now. Will continue celexa and add buspar. Call with any concerns.       Relevant Medications   busPIRone (BUSPAR) 5 MG tablet   citalopram (CELEXA) 40 MG tablet       Follow up plan: Return in about 4 weeks (around 12/17/2016) for follow up ADD.

## 2016-11-22 ENCOUNTER — Ambulatory Visit: Payer: BLUE CROSS/BLUE SHIELD | Admitting: Family Medicine

## 2016-11-26 ENCOUNTER — Telehealth: Payer: Self-pay

## 2016-11-26 ENCOUNTER — Encounter: Payer: Self-pay | Admitting: Gastroenterology

## 2016-11-26 ENCOUNTER — Other Ambulatory Visit: Payer: Self-pay

## 2016-11-26 ENCOUNTER — Ambulatory Visit (INDEPENDENT_AMBULATORY_CARE_PROVIDER_SITE_OTHER): Payer: BLUE CROSS/BLUE SHIELD | Admitting: Gastroenterology

## 2016-11-26 VITALS — BP 113/76 | HR 93 | Temp 98.4°F | Resp 18 | Ht 61.0 in | Wt 167.0 lb

## 2016-11-26 DIAGNOSIS — R195 Other fecal abnormalities: Secondary | ICD-10-CM

## 2016-11-26 DIAGNOSIS — R109 Unspecified abdominal pain: Secondary | ICD-10-CM | POA: Diagnosis not present

## 2016-11-26 NOTE — Progress Notes (Signed)
Gastroenterology Consultation  Referring Provider:     Particia Nearing,* Primary Care Physician:  Olevia Perches, DO Primary Gastroenterologist:  Dr. Wyline Mood  Reason for Consultation:     Abdominal cramping         HPI:   Pamela Avila is a 46 y.o. y/o Pamela referred for consultation & management  by Dr. Olevia Perches, DO She has been referred for abdominal cramping. CBC,CMP in 07/2016 was normal.  She says she has had abdominal discomfort for 2 months which is getting better.It began initially which was constant and then changed to only after meals and now very occasionally. At this time she has it once a week . Each episode lasts only a few minutes, feels like a poke. Its in the upper part of the abdomen , localized. Usually occurs after meals, presently not an issue. Last episode was 2 weeks. She has a bowel movement few times a day , which is her baseline but more softer of late. Denies any regular NSAID use.  When it began she says she had an "oily stool" .   No family history of colon cancer or polyps.   She says she is feeling a lot better but not completely back to normal in terms of abdominal discomfort and noticed change in consistency of her bowel movements.    She is a smoker, she takes prilosec for GERD.     Past Medical History:  Diagnosis Date  . ADHD (attention deficit hyperactivity disorder)   . Anemia   . Anxiety   . Anxiety disorder   . Depression   . Family history of diabetes mellitus   . GERD (gastroesophageal reflux disease)   . HSV (herpes simplex virus) infection   . Hypertension   . Hypothyroidism   . IBS (irritable bowel syndrome)   . Thyroid disease    hypothroidsim    Past Surgical History:  Procedure Laterality Date  . BREAST SURGERY Right    lumpectomy  . CESAREAN SECTION     x 2  . CRYOTHERAPY  20 + years ago   cervix  . DILITATION & CURRETTAGE/HYSTROSCOPY WITH NOVASURE ABLATION N/A 04/11/2015   Procedure: DILATATION &  CURETTAGE/HYSTEROSCOPY WITH NOVASURE ABLATION;  Surgeon: Herold Harms, MD;  Location: [facility];  Service: Gynecology;  Laterality: N/A;  . ESSURE TUBAL LIGATION    . lumpectomy Right   . TONSILLECTOMY    . TUBAL LIGATION      Prior to Admission medications   Medication Sig Start Date End Date Taking? Authorizing Provider  amphetamine-dextroamphetamine (ADDERALL) 10 MG tablet Take 1 tablet (10 mg total) by mouth 2 (two) times daily with a meal. 11/19/16   Megan P Johnson, DO  atomoxetine (STRATTERA) 80 MG capsule Take 1 capsule (80 mg total) by mouth daily. 11/19/16   Megan P Johnson, DO  busPIRone (BUSPAR) 5 MG tablet Take 1-2 tablets (5-10 mg total) by mouth 3 (three) times daily as needed (anxiety). 11/19/16   Megan P Johnson, DO  citalopram (CELEXA) 40 MG tablet Take 1.5 tablets (60 mg total) by mouth daily. 11/19/16   Megan P Johnson, DO  levothyroxine (SYNTHROID, LEVOTHROID) 88 MCG tablet Take 1 tablet (88 mcg total) by mouth daily before breakfast. 11/19/16   Megan P Johnson, DO  metoprolol succinate (TOPROL-XL) 25 MG 24 hr tablet Take 1.5 tablets (37.5 mg total) by mouth daily. 11/19/16   Megan P Johnson, DO  omeprazole (PRILOSEC) 20 MG capsule Take 1 capsule (20  mg total) by mouth daily. 11/19/16   Megan P Johnson, DO  PEPPERMINT OIL PO Take by mouth daily before lunch.    Historical Provider, MD  Probiotic Product (PROBIOTIC PO) Take by mouth daily.    Historical Provider, MD    Family History  Problem Relation Age of Onset  . Hypothyroidism Paternal Grandfather   . Breast cancer Paternal Grandmother   . Cancer Paternal Grandmother     breast  . Breast cancer Maternal Grandmother   . Hyperlipidemia Maternal Grandfather   . Diabetes Father   . Obesity Father   . Hypertension Father   . Hyperlipidemia Father   . Ovarian cancer Paternal Aunt     ?  [name] Cancer Paternal Aunt     breast and cervical  . Breast cancer Paternal Aunt   . Hypothyroidism Mother   . Breast cancer Son   .  Cancer Son     breast  . Colon cancer Neg Hx      Social History  Substance Use Topics  . Smoking status: Heavy Tobacco Smoker    Packs/day: 0.50    Types: Cigarettes  . Smokeless tobacco: Never Used  . Alcohol use Yes     Comment: occasional    Allergies as of 11/26/2016 - Review Complete 11/19/2016  Allergen Reaction Noted  . Erythromycin Itching 02/01/2015    Review of Systems:    All systems reviewed and negative except where noted in HPI.   Physical Exam:  There were no vitals taken for this visit. No LMP recorded. Patient has had an ablation. Psych:  Alert and cooperative. Normal mood and affect. General:   Alert,  Well-developed, well-nourished, pleasant and cooperative in NAD Head:  Normocephalic and atraumatic. Eyes:  Sclera clear, no icterus.   Conjunctiva pink. Ears:  Normal auditory acuity. Nose:  No deformity, discharge, or lesions. Mouth:  No deformity or lesions,oropharynx pink & moist. Neck:  Supple; no masses or thyromegaly. Lungs:  Respirations even and unlabored.  Clear throughout to auscultation.   No wheezes, crackles, or rhonchi. No acute distress. Heart:  Regular rate and rhythm; no murmurs, clicks, rubs, or gallops. Abdomen:  Normal bowel sounds.  No bruits.  Soft, non-tender and non-distended without masses, hepatosplenomegaly or hernias noted.  No guarding or rebound tenderness.    Neurologic:  Alert and oriented x3;  grossly normal neurologically Psych:  Alert and cooperative. Normal mood and affect.  Imaging Studies: No results found.  Assessment and Plan:   Pamela Avila is a 46 y.o. y/o Pamela has been referred for abdominal discomfort and she has noticed a change in bowel movements. The is posr prandial. She is a smoker.   Plan   1. Continue PPI for GERD 2. Stop smoking  3. EGD+colonoscopy to evaluate for dyspepsia and change on bowel habits 4. USG abdomen to r/o gall stones 5. Celiac serology , stool to r/o infecton , lactoferrin ,  stool H pylori antigen  Follow up in 8-12 weeks   Dr Wyline Mood MD

## 2016-11-26 NOTE — Telephone Encounter (Signed)
Gastroenterology Pre-Procedure Review  Request Date: 5/4 Requesting Physician: Dr. Tobi Bastos  PATIENT REVIEW QUESTIONS: The patient responded to the following health history questions as indicated:    1. Are you having any GI issues? yes (Constipation) 2. Do you have a personal history of Polyps? no 3. Do you have a family history of Colon Cancer or Polyps? no 4. Diabetes Mellitus? no 5. Joint replacements in the past 12 months?no 6. Major health problems in the past 3 months?no 7. Any artificial heart valves, MVP, or defibrillator?no    MEDICATIONS & ALLERGIES:    Patient reports the following regarding taking any anticoagulation/antiplatelet therapy:   Plavix, Coumadin, Eliquis, Xarelto, Lovenox, Pradaxa, Brilinta, or Effient? no Aspirin? no  Patient confirms/reports the following medications:  Current Outpatient Prescriptions  Medication Sig Dispense Refill  . amphetamine-dextroamphetamine (ADDERALL) 10 MG tablet Take 1 tablet (10 mg total) by mouth 2 (two) times daily with a meal. 60 tablet 0  . atomoxetine (STRATTERA) 80 MG capsule Take 1 capsule (80 mg total) by mouth daily. 90 capsule 1  . busPIRone (BUSPAR) 5 MG tablet Take 1-2 tablets (5-10 mg total) by mouth 3 (three) times daily as needed (anxiety). 90 tablet 1  . citalopram (CELEXA) 40 MG tablet Take 1.5 tablets (60 mg total) by mouth daily. 135 tablet 1  . levothyroxine (SYNTHROID, LEVOTHROID) 88 MCG tablet Take 1 tablet (88 mcg total) by mouth daily before breakfast. 90 tablet 2  . metoprolol succinate (TOPROL-XL) 25 MG 24 hr tablet Take 1.5 tablets (37.5 mg total) by mouth daily. 135 tablet 2  . omeprazole (PRILOSEC) 20 MG capsule Take 1 capsule (20 mg total) by mouth daily. 30 capsule 3  . PEPPERMINT OIL PO Take by mouth daily before lunch.    . Probiotic Product (PROBIOTIC PO) Take by mouth daily.     No current facility-administered medications for this visit.     Patient confirms/reports the following allergies:   Allergies  Allergen Reactions  . Erythromycin Itching    No orders of the defined types were placed in this encounter.   AUTHORIZATION INFORMATION Primary Insurance: 1D#: Group #:  Secondary Insurance: 1D#: Group #:  SCHEDULE INFORMATION: Date: 5/4 Time: Location: [facility]

## 2016-11-27 ENCOUNTER — Telehealth: Payer: Self-pay | Admitting: Gastroenterology

## 2016-11-27 NOTE — Telephone Encounter (Signed)
11/27/16 Spoke with Purnell Shoemaker at [facility] NO prior auth required for Colonoscopy 82956 / EGD 43235 for R19.5 and R10.9

## 2016-11-28 ENCOUNTER — Telehealth: Payer: Self-pay | Admitting: Gastroenterology

## 2016-11-28 NOTE — Telephone Encounter (Signed)
11/28/16 (4:09 pm) Spoke with Oneal Grout at [facility] and NO prior auth required for  [employer] [place] Abd complete 16109 R10.9

## 2016-11-29 ENCOUNTER — Ambulatory Visit
Admission: RE | Admit: 2016-11-29 | Discharge: 2016-11-29 | Disposition: A | Discharge status: Home / Self Care | Payer: BLUE CROSS/BLUE SHIELD | Source: Ambulatory Visit | Attending: Gastroenterology | Admitting: Gastroenterology

## 2016-11-29 DIAGNOSIS — R109 Unspecified abdominal pain: Secondary | ICD-10-CM

## 2016-12-05 ENCOUNTER — Telehealth: Payer: Self-pay

## 2016-12-05 NOTE — Telephone Encounter (Signed)
Unable to lvm due to mailbox being full.  Results per Dr. Tobi Bastos: Inform no gall stones per USG if still having pain after meals then needs HIDA scan

## 2016-12-14 ENCOUNTER — Encounter: Payer: Self-pay | Admitting: Family Medicine

## 2016-12-20 ENCOUNTER — Ambulatory Visit: Payer: BLUE CROSS/BLUE SHIELD | Admitting: Family Medicine

## 2016-12-24 ENCOUNTER — Ambulatory Visit (INDEPENDENT_AMBULATORY_CARE_PROVIDER_SITE_OTHER): Payer: BLUE CROSS/BLUE SHIELD | Admitting: Family Medicine

## 2016-12-24 ENCOUNTER — Encounter: Payer: Self-pay | Admitting: Family Medicine

## 2016-12-24 VITALS — BP 112/77 | HR 74 | Ht 61.0 in | Wt 168.0 lb

## 2016-12-24 DIAGNOSIS — F419 Anxiety disorder, unspecified: Secondary | ICD-10-CM

## 2016-12-24 DIAGNOSIS — F901 Attention-deficit hyperactivity disorder, predominantly hyperactive type: Secondary | ICD-10-CM

## 2016-12-24 MED ORDER — AMPHETAMINE-DEXTROAMPHETAMINE 15 MG PO TABS: 15.0000 mg | ORAL_TABLET | Freq: Two times a day (BID) | ORAL | 0 refills | Status: DC

## 2016-12-24 MED ORDER — BUSPIRONE HCL 5 MG PO TABS: 5.0000 mg | ORAL_TABLET | Freq: Three times a day (TID) | ORAL | 6 refills | Status: DC | PRN

## 2016-12-24 NOTE — Assessment & Plan Note (Signed)
Stable. Continue current regimen. Continue to monitor. Call with any concerns.  

## 2016-12-24 NOTE — Assessment & Plan Note (Signed)
Not doing great. Will get back on her strattera and will increase adderall to 15mg  BID and recheck in 1 month. Call with any concerns.

## 2016-12-24 NOTE — Progress Notes (Signed)
BP 112/77   Pulse 74   Ht 5\' 1"  (1.549 m)   Wt 168 lb (76.2 kg)   SpO2 97%   BMI 31.74 kg/m    Subjective:    Patient ID: Pamela Avila, female    DOB: 1971/05/11, 46 y.o.   MRN: [id number]  HPI: Pamela Avila is a 46 y.o. female  Chief Complaint  Patient presents with  . Follow-up   ADHD FOLLOW UP- has been out of her strattera for about a week, feels like she's better, but not 100% there ADHD status: better Satisfied with current therapy: no Medication compliance:  good compliance Controlled substance contract: yes Previous psychiatry evaluation: no Previous medications: yes adderall, strattera   Taking meds on weekends/vacations: yes Work/school performance:  good Difficulty sustaining attention/completing tasks: yes Distracted by extraneous stimuli: yes Does not listen when spoken to: no  Fidgets with hands or feet: no Unable to stay in seat: no Blurts out/interrupts others: no ADHD Medication Side Effects: no    Decreased appetite: no    Headache: no    Sleeping disturbance pattern: no    Irritability: no    Rebound effects (worse than baseline) off medication: no    Anxiousness: no    Dizziness: no    Tics: no  ANXIETY/STRESS Duration:stable Anxious mood: yes  Excessive worrying: yes Irritability: yes  Sweating: no Nausea: no Palpitations:no Hyperventilation: no Panic attacks: no Agoraphobia: no  Obscessions/compulsions: no Depressed mood: no Depression screen [facility] 07/03/2016 02/10/2016  Decreased Interest 0 1  Down, Depressed, Hopeless 0 0  PHQ - 2 Score 0 1  Altered sleeping 3 -  Tired, decreased energy 2 -  Change in appetite 0 -  Feeling bad or failure about yourself  0 -  Trouble concentrating 2 -  Moving slowly or fidgety/restless 0 -  Suicidal thoughts 0 -  PHQ-9 Score 7 -   Anhedonia: no Weight changes: no Insomnia: no   Hypersomnia: no Fatigue/loss of energy: no Feelings of worthlessness: no Feelings of guilt: no Impaired  concentration/indecisiveness: no Suicidal ideations: no  Crying spells: no Recent Stressors/Life Changes: yes   Relationship problems: no   Family stress: yes     Financial stress: no    Job stress: no    Recent death/loss: no   Relevant past medical, surgical, family and social history reviewed and updated as indicated. Interim medical history since our last visit reviewed. Allergies and medications reviewed and updated.  Review of Systems  Constitutional: Negative.   Respiratory: Negative.   Cardiovascular: Negative.   Psychiatric/Behavioral: Positive for decreased concentration. Negative for agitation, behavioral problems, confusion, dysphoric mood, hallucinations, self-injury, sleep disturbance and suicidal ideas. The patient is nervous/anxious. The patient is not hyperactive.     Per HPI unless specifically indicated above     Objective:    BP 112/77   Pulse 74   Ht 5\' 1"  (1.549 m)   Wt 168 lb (76.2 kg)   SpO2 97%   BMI 31.74 kg/m   Wt Readings from Last 3 Encounters:  12/24/16 168 lb (76.2 kg)  11/26/16 167 lb (75.8 kg)  11/19/16 169 lb 12.8 oz (77 kg)    Physical Exam  Constitutional: She is oriented to person, place, and time. She appears well-developed and well-nourished. No distress.  HENT:  Head: Normocephalic and atraumatic.  Right Ear: Hearing normal.  Left Ear: Hearing normal.  Nose: Nose normal.  Eyes: Conjunctivae and lids are normal. Right eye exhibits no discharge.  Left eye exhibits no discharge. No scleral icterus.  Cardiovascular: Normal rate, regular rhythm, normal heart sounds and intact distal pulses.  Exam reveals no gallop and no friction rub.   No murmur heard. Pulmonary/Chest: Effort normal and breath sounds normal. No respiratory distress. She has no wheezes. She has no rales. She exhibits no tenderness.  Musculoskeletal: Normal range of motion.  Neurological: She is alert and oriented to person, place, and time.  Skin: Skin is warm, dry  and intact. No rash noted. No erythema. No pallor.  Psychiatric: She has a normal mood and affect. Her speech is normal and behavior is normal. Judgment and thought content normal. Cognition and memory are normal.  Nursing note and vitals reviewed.   Results for orders placed or performed in visit on 10/17/16  Veritor Flu A/B Waived  Result Value Ref Range   Influenza A Negative Negative   Influenza B Negative Negative  Microscopic Examination  Result Value Ref Range   WBC, UA None seen 0 - 5 /hpf   RBC, UA None seen 0 - 2 /hpf   Epithelial Cells (non renal) 0-10 0 - 10 /hpf   Crystals Present (A) N/A   Crystal Type Amorphous Sediment N/A   Bacteria, UA None seen None seen/Few  CBC With Differential/Platelet  Result Value Ref Range   WBC 10.8 3.4 - 10.8 x10E3/uL   RBC 4.93 3.77 - 5.28 x10E6/uL   Hemoglobin 15.7 11.1 - 15.9 g/dL   Hematocrit 40.9 81.1 - 46.6 %   MCV 90 79 - 97 fL   MCH 31.8 26.6 - 33.0 pg   MCHC 35.5 31.5 - 35.7 g/dL   RDW 91.4 78.2 - 95.6 %   Platelets 296 150 - 379 x10E3/uL   Neutrophils 46 Not Estab. %   Lymphs 32 Not Estab. %   MID 21 Not Estab. %   Neutrophils Absolute 5.0 1.4 - 7.0 x10E3/uL   Lymphocytes Absolute 3.5 (H) 0.7 - 3.1 x10E3/uL   MID (Absolute) 2.3 (H) 0.1 - 1.6 X10E3/uL  UA/M w/rflx Culture, Routine  Result Value Ref Range   Specific Gravity, UA 1.025 1.005 - 1.030   pH, UA 5.5 5.0 - 7.5   Color, UA Yellow Yellow   Appearance Ur Clear Clear   Leukocytes, UA Negative Negative   Protein, UA Negative Negative/Trace   Glucose, UA Negative Negative   Ketones, UA Negative Negative   RBC, UA Negative Negative   Bilirubin, UA Negative Negative   Urobilinogen, Ur 0.2 0.2 - 1.0 mg/dL   Nitrite, UA Negative Negative   Microscopic Examination See below:       Assessment & Plan:   Problem List Items Addressed This Visit      Other   ADHD (attention deficit hyperactivity disorder) - Primary    Not doing great. Will get back on her  strattera and will increase adderall to 15mg  BID and recheck in 1 month. Call with any concerns.       Anxiety    Stable. Continue current regimen. Continue to monitor. Call with any concerns.       Relevant Medications   busPIRone (BUSPAR) 5 MG tablet       Follow up plan: No Follow-up on file.

## 2016-12-27 ENCOUNTER — Telehealth: Payer: Self-pay

## 2016-12-27 LAB — CELIAC DISEASE PANEL
Endomysial IgA: NEGATIVE
IGA/IMMUNOGLOBULIN A, SERUM: 320 mg/dL (ref 87–352)

## 2016-12-27 LAB — CLOSTRIDIUM DIFFICILE BY PCR: Toxigenic C. Difficile by PCR: NEGATIVE

## 2016-12-27 NOTE — Telephone Encounter (Signed)
Unable to LVM for patient callback to advise of results per Dr. Tobi BastosAnna. Mailbox is full.  Celiac serology negative

## 2016-12-27 NOTE — Telephone Encounter (Signed)
-----   Message from Wyline MoodKiran Anna, MD sent at 12/27/2016 12:44 PM EDT ----- Celiac serology negative

## 2016-12-28 ENCOUNTER — Encounter: Payer: Self-pay | Admitting: *Deleted

## 2016-12-28 ENCOUNTER — Ambulatory Visit: Payer: BLUE CROSS/BLUE SHIELD | Admitting: Anesthesiology

## 2016-12-28 ENCOUNTER — Encounter
Admission: RE | Disposition: A | Discharge status: Home / Self Care | Payer: Self-pay | Source: Ambulatory Visit | Attending: Gastroenterology

## 2016-12-28 ENCOUNTER — Ambulatory Visit
Admission: RE | Admit: 2016-12-28 | Discharge: 2016-12-28 | Disposition: A | Discharge status: Home / Self Care | Payer: BLUE CROSS/BLUE SHIELD | Source: Ambulatory Visit | Attending: Gastroenterology | Admitting: Gastroenterology

## 2016-12-28 DIAGNOSIS — Z803 Family history of malignant neoplasm of breast: Secondary | ICD-10-CM | POA: Insufficient documentation

## 2016-12-28 DIAGNOSIS — K2950 Unspecified chronic gastritis without bleeding: Secondary | ICD-10-CM | POA: Diagnosis not present

## 2016-12-28 DIAGNOSIS — K589 Irritable bowel syndrome without diarrhea: Secondary | ICD-10-CM | POA: Diagnosis not present

## 2016-12-28 DIAGNOSIS — R195 Other fecal abnormalities: Secondary | ICD-10-CM | POA: Diagnosis not present

## 2016-12-28 DIAGNOSIS — F329 Major depressive disorder, single episode, unspecified: Secondary | ICD-10-CM | POA: Diagnosis not present

## 2016-12-28 DIAGNOSIS — Z881 Allergy status to other antibiotic agents status: Secondary | ICD-10-CM | POA: Insufficient documentation

## 2016-12-28 DIAGNOSIS — Z8349 Family history of other endocrine, nutritional and metabolic diseases: Secondary | ICD-10-CM | POA: Insufficient documentation

## 2016-12-28 DIAGNOSIS — F909 Attention-deficit hyperactivity disorder, unspecified type: Secondary | ICD-10-CM | POA: Diagnosis not present

## 2016-12-28 DIAGNOSIS — K2980 Duodenitis without bleeding: Secondary | ICD-10-CM | POA: Insufficient documentation

## 2016-12-28 DIAGNOSIS — R194 Change in bowel habit: Secondary | ICD-10-CM | POA: Insufficient documentation

## 2016-12-28 DIAGNOSIS — K449 Diaphragmatic hernia without obstruction or gangrene: Secondary | ICD-10-CM | POA: Diagnosis not present

## 2016-12-28 DIAGNOSIS — Z8049 Family history of malignant neoplasm of other genital organs: Secondary | ICD-10-CM | POA: Diagnosis not present

## 2016-12-28 DIAGNOSIS — K210 Gastro-esophageal reflux disease with esophagitis: Secondary | ICD-10-CM | POA: Diagnosis not present

## 2016-12-28 DIAGNOSIS — K640 First degree hemorrhoids: Secondary | ICD-10-CM | POA: Diagnosis not present

## 2016-12-28 DIAGNOSIS — Z833 Family history of diabetes mellitus: Secondary | ICD-10-CM | POA: Diagnosis not present

## 2016-12-28 DIAGNOSIS — K2970 Gastritis, unspecified, without bleeding: Secondary | ICD-10-CM | POA: Diagnosis not present

## 2016-12-28 DIAGNOSIS — Z9889 Other specified postprocedural states: Secondary | ICD-10-CM | POA: Insufficient documentation

## 2016-12-28 DIAGNOSIS — K209 Esophagitis, unspecified: Secondary | ICD-10-CM | POA: Diagnosis not present

## 2016-12-28 DIAGNOSIS — K2990 Gastroduodenitis, unspecified, without bleeding: Secondary | ICD-10-CM | POA: Diagnosis not present

## 2016-12-28 DIAGNOSIS — F17210 Nicotine dependence, cigarettes, uncomplicated: Secondary | ICD-10-CM | POA: Diagnosis not present

## 2016-12-28 DIAGNOSIS — I10 Essential (primary) hypertension: Secondary | ICD-10-CM | POA: Diagnosis not present

## 2016-12-28 DIAGNOSIS — R1013 Epigastric pain: Secondary | ICD-10-CM | POA: Insufficient documentation

## 2016-12-28 DIAGNOSIS — Z79899 Other long term (current) drug therapy: Secondary | ICD-10-CM | POA: Insufficient documentation

## 2016-12-28 DIAGNOSIS — F419 Anxiety disorder, unspecified: Secondary | ICD-10-CM | POA: Insufficient documentation

## 2016-12-28 DIAGNOSIS — Z9851 Tubal ligation status: Secondary | ICD-10-CM | POA: Insufficient documentation

## 2016-12-28 DIAGNOSIS — E039 Hypothyroidism, unspecified: Secondary | ICD-10-CM | POA: Insufficient documentation

## 2016-12-28 DIAGNOSIS — K649 Unspecified hemorrhoids: Secondary | ICD-10-CM | POA: Diagnosis not present

## 2016-12-28 HISTORY — PX: ESOPHAGOGASTRODUODENOSCOPY (EGD) WITH PROPOFOL: SHX5813

## 2016-12-28 HISTORY — PX: COLONOSCOPY WITH PROPOFOL: SHX5780

## 2016-12-28 LAB — H. PYLORI ANTIGEN, STOOL: H pylori Ag, Stl: NEGATIVE

## 2016-12-28 LAB — POCT PREGNANCY, URINE: Preg Test, Ur: NEGATIVE

## 2016-12-28 SURGERY — COLONOSCOPY WITH PROPOFOL
Anesthesia: General

## 2016-12-28 MED ORDER — FENTANYL CITRATE (PF) 100 MCG/2ML IJ SOLN
INTRAMUSCULAR | Status: DC | PRN
  Administered 2016-12-28: 25 ug via INTRAVENOUS

## 2016-12-28 MED ORDER — FENTANYL CITRATE (PF) 100 MCG/2ML IJ SOLN
INTRAMUSCULAR | Status: AC
  Filled 2016-12-28: qty 2

## 2016-12-28 MED ORDER — MIDAZOLAM HCL 2 MG/2ML IJ SOLN
INTRAMUSCULAR | Status: DC | PRN
  Administered 2016-12-28: 2 mg via INTRAVENOUS

## 2016-12-28 MED ORDER — LIDOCAINE HCL (CARDIAC) 20 MG/ML IV SOLN
INTRAVENOUS | Status: DC | PRN
  Administered 2016-12-28: 40 mg via INTRAVENOUS

## 2016-12-28 MED ORDER — PROPOFOL 500 MG/50ML IV EMUL
INTRAVENOUS | Status: DC | PRN
  Administered 2016-12-28: 150 ug/kg/min via INTRAVENOUS

## 2016-12-28 MED ORDER — OXYMETAZOLINE HCL 0.05 % NA SOLN
NASAL | Status: DC | PRN
  Administered 2016-12-28: 2 via NASAL

## 2016-12-28 MED ORDER — MIDAZOLAM HCL 2 MG/2ML IJ SOLN
INTRAMUSCULAR | Status: AC
  Filled 2016-12-28: qty 2

## 2016-12-28 MED ORDER — GLYCOPYRROLATE 0.2 MG/ML IJ SOLN
INTRAMUSCULAR | Status: DC | PRN
  Administered 2016-12-28: 0.2 mg via INTRAVENOUS

## 2016-12-28 MED ORDER — PROPOFOL 10 MG/ML IV BOLUS
INTRAVENOUS | Status: DC | PRN
  Administered 2016-12-28: 80 mg via INTRAVENOUS
  Administered 2016-12-28: 20 mg via INTRAVENOUS

## 2016-12-28 MED ORDER — GLYCOPYRROLATE 0.2 MG/ML IJ SOLN
INTRAMUSCULAR | Status: AC
  Filled 2016-12-28: qty 1

## 2016-12-28 MED ORDER — LIDOCAINE HCL 2 % IJ SOLN
INTRAMUSCULAR | Status: AC
  Filled 2016-12-28: qty 10

## 2016-12-28 MED ORDER — SODIUM CHLORIDE 0.9 % IV SOLN
INTRAVENOUS | Status: DC
  Administered 2016-12-28: 1000 mL via INTRAVENOUS
  Administered 2016-12-28: 11:00:00 via INTRAVENOUS

## 2016-12-28 MED ORDER — PROPOFOL 500 MG/50ML IV EMUL
INTRAVENOUS | Status: AC
  Filled 2016-12-28: qty 50

## 2016-12-28 NOTE — Anesthesia Postprocedure Evaluation (Signed)
Anesthesia Post Note  Patient: Mateo FlowKaren C Truby  Procedure(s) Performed: Procedure(s) (LRB): COLONOSCOPY WITH PROPOFOL (N/A) ESOPHAGOGASTRODUODENOSCOPY (EGD) WITH PROPOFOL (N/A)  Patient location during evaluation: PACU Anesthesia Type: General Level of consciousness: awake and alert and oriented Pain management: pain level controlled Vital Signs Assessment: post-procedure vital signs reviewed and stable Respiratory status: spontaneous breathing Cardiovascular status: blood pressure returned to baseline Anesthetic complications: no     Last Vitals:  Vitals:   12/28/16 1140 12/28/16 1150  BP: (!) 131/98 (!) 121/99  Pulse: 99 96  Resp: 19 (!) 22  Temp:      Last Pain:  Vitals:   12/28/16 1120  TempSrc: Tympanic                 Shavontae Gibeault

## 2016-12-28 NOTE — Op Note (Signed)
[facility] Gastroenterology Patient Name: Pamela PolingKaren Avila Procedure Date: 12/28/2016 10:40 AM MRN: [id number] Account #: [id number] Date of Birth: [birth year] Admit Type: Outpatient Age: [age] [facility] 4 Gender: Female Note Status: Finalized Procedure:            Colonoscopy Indications:          Incidental change in bowel habits noted Providers:            Pamela MoodKiran Skylier Kretschmer MD, MD Referring MD:         No Local Md, MD (Referring MD) Medicines:            Monitored Anesthesia Care Complications:        No immediate complications. Procedure:            Pre-Anesthesia Assessment:                       - Prior to the procedure, a History and Physical was                        performed, and patient medications, allergies and                        sensitivities were reviewed. The patient's tolerance of                        previous anesthesia was reviewed.                       - The risks and benefits of the procedure and the                        sedation options and risks were discussed with the                        patient. All questions were answered and informed                        consent was obtained.                       - ASA Grade Assessment: II - A patient with mild                        systemic disease.                       After obtaining informed consent, the colonoscope was                        passed under direct vision. Throughout the procedure,                        the patient's blood pressure, pulse, and oxygen                        saturations were monitored continuously. The Olympus                        CF-H180AL colonoscope ( S#: [id number] ) was introduced  through the anus and advanced to the the cecum,                        identified by the appendiceal orifice, IC valve and                        transillumination. The colonoscopy was performed with                        ease. The patient tolerated the  procedure well. The                        quality of the bowel preparation was good. Findings:      Non-bleeding internal hemorrhoids were found during retroflexion. The       hemorrhoids were Grade I (internal hemorrhoids that do not prolapse).      The colon (entire examined portion) appeared normal. Biopsies for       histology were taken with a cold forceps from the right colon and left       colon for evaluation of microscopic colitis. Impression:           - Non-bleeding internal hemorrhoids.                       - The entire examined colon is normal. Biopsied. Recommendation:       - Discharge patient to home (with escort).                       - Resume previous diet.                       - Continue present medications.                       - Await pathology results.                       - Repeat colonoscopy in 10 years for screening purposes.                       - Return to GI office in 6 weeks. Procedure Code(s):    --- Professional ---                       [id number], Colonoscopy, flexible; with biopsy, single or                        multiple Diagnosis Code(s):    --- Professional ---                       K64.0, First degree hemorrhoids CPT copyright 2016 American Medical Association. All rights reserved. The codes documented in this report are preliminary and upon coder review may  be revised to meet current compliance requirements. Pamela Mood, MD Pamela Mood MD, MD 12/28/2016 11:16:24 AM This report has been signed electronically. Number of Addenda: 0 Note Initiated On: 12/28/2016 10:40 AM Scope Withdrawal Time: 0 hours 9 minutes 35 seconds  Total Procedure Duration: 0 hours 12 minutes 1 second       [facility]

## 2016-12-28 NOTE — Transfer of Care (Signed)
Immediate Anesthesia Transfer of Care Note  Patient: Pamela FlowKaren C Debenedetto  Procedure(s) Performed: Procedure(s): COLONOSCOPY WITH PROPOFOL (N/A) ESOPHAGOGASTRODUODENOSCOPY (EGD) WITH PROPOFOL (N/A)  Patient Location: PACU and Endoscopy Unit  Anesthesia Type:General  Level of Consciousness: sedated  Airway & Oxygen Therapy: Patient Spontanous Breathing and Patient connected to nasal cannula oxygen  Post-op Assessment: Report given to RN and Post -op Vital signs reviewed and stable  Post vital signs: Reviewed and stable  Last Vitals:  Vitals:   12/28/16 0923  BP: 122/87  Pulse: 91  Resp: 18  Temp: 36.5 C    Complications: No apparent anesthesia complications

## 2016-12-28 NOTE — H&P (Signed)
Pamela Mood MD [address], [address] [place], [place] [address] Phone: [phone] Fax : [phone]  Primary Care Physician:  Dorcas Carrow, DO Primary Gastroenterologist:  Dr. Wyline Mood   Pre-Procedure History & Physical: HPI:  Pamela Avila is a 46 y.o. female is here for an endoscopy and colonoscopy.   Past Medical History:  Diagnosis Date  . ADHD (attention deficit hyperactivity disorder)   . Anemia   . Anxiety   . Anxiety disorder   . Depression   . Family history of diabetes mellitus   . GERD (gastroesophageal reflux disease)   . HSV (herpes simplex virus) infection   . Hypertension   . Hypothyroidism   . IBS (irritable bowel syndrome)   . Thyroid disease    hypothroidsim    Past Surgical History:  Procedure Laterality Date  . BREAST SURGERY Right    lumpectomy  . CESAREAN SECTION     x 2  . CRYOTHERAPY  20 + years ago   cervix  . DILITATION & CURRETTAGE/HYSTROSCOPY WITH NOVASURE ABLATION N/A 04/11/2015   Procedure: DILATATION & CURETTAGE/HYSTEROSCOPY WITH NOVASURE ABLATION;  Surgeon: Herold Harms, MD;  Location: [facility] ORS;  Service: Gynecology;  Laterality: N/A;  . ESSURE TUBAL LIGATION    . lumpectomy Right   . TONSILLECTOMY    . TUBAL LIGATION      Prior to Admission medications   Medication Sig Start Date End Date Taking? Authorizing Provider  metoprolol succinate (TOPROL-XL) 25 MG 24 hr tablet Take 1.5 tablets (37.5 mg total) by mouth daily. 11/19/16  Yes Johnson, Megan P, DO  amphetamine-dextroamphetamine (ADDERALL) 15 MG tablet Take 1 tablet by mouth 2 (two) times daily with a meal. 12/24/16   Johnson, Megan P, DO  atomoxetine (STRATTERA) 80 MG capsule Take 1 capsule (80 mg total) by mouth daily. Patient not taking: Reported on 12/24/2016 11/19/16   Olevia Perches P, DO  busPIRone (BUSPAR) 5 MG tablet Take 1-2 tablets (5-10 mg total) by mouth 3 (three) times daily as needed (anxiety). 12/24/16   Johnson, Megan P, DO  citalopram (CELEXA) 40 MG  tablet Take 1.5 tablets (60 mg total) by mouth daily. 11/19/16   Olevia Perches P, DO  dicyclomine (BENTYL) 10 MG capsule  10/17/16   [provider]  levothyroxine (SYNTHROID, LEVOTHROID) 88 MCG tablet Take 1 tablet (88 mcg total) by mouth daily before breakfast. 11/19/16   Laural Benes, Megan P, DO  omeprazole (PRILOSEC) 20 MG capsule Take 1 capsule (20 mg total) by mouth daily. 11/19/16   Johnson, Megan P, DO  PEPPERMINT OIL PO Take by mouth daily before lunch.    [provider]  Probiotic Product (PROBIOTIC PO) Take by mouth daily.    [provider]    Allergies as of 11/26/2016 - Review Complete 11/26/2016  Allergen Reaction Noted  . Erythromycin Itching 02/01/2015    Family History  Problem Relation Age of Onset  . Hypothyroidism Paternal Grandfather   . Breast cancer Paternal Grandmother   . Cancer Paternal Grandmother        breast  . Breast cancer Maternal Grandmother   . Hyperlipidemia Maternal Grandfather   . Diabetes Father   . Obesity Father   . Hypertension Father   . Hyperlipidemia Father   . Ovarian cancer Paternal Aunt        ?  [name] Cancer Paternal Aunt        breast and cervical  . Breast cancer Paternal Aunt   . Hypothyroidism Mother   .  Breast cancer Son   . Cancer Son        breast  . Colon cancer Neg Hx     Social History   Social History  . Marital status: Married    Spouse name: N/A  . Number of children: N/A  . Years of education: N/A   Occupational History  . Not on file.   Social History Main Topics  . Smoking status: Heavy Tobacco Smoker    Packs/day: 1.00    Types: Cigarettes  . Smokeless tobacco: Never Used  . Alcohol use Yes     Comment: occasional  . Drug use: Yes    Types: Marijuana     Comment: Occasional  . Sexual activity: Yes   Other Topics Concern  . Not on file   Social History Narrative  . No narrative on file    Review of Systems: See HPI, otherwise negative ROS  Physical Exam: There were no  vitals taken for this visit. General:   Alert,  pleasant and cooperative in NAD Head:  Normocephalic and atraumatic. Neck:  Supple; no masses or thyromegaly. Lungs:  Clear throughout to auscultation.    Heart:  Regular rate and rhythm. Abdomen:  Soft, nontender and nondistended. Normal bowel sounds, without guarding, and without rebound.   Neurologic:  Alert and  oriented x4;  grossly normal neurologically.  Impression/Plan: Pamela Avila is here for an endoscopy and colonoscopy to be performed for dyspepsia and change in bowel habits   Risks, benefits, limitations, and alternatives regarding  endoscopy and colonoscopy have been reviewed with the patient.  Questions have been answered.  All parties agreeable.   Pamela MoodKiran Bevely Hackbart, MD  12/28/2016, 9:42 AM

## 2016-12-28 NOTE — Anesthesia Preprocedure Evaluation (Signed)
Anesthesia Evaluation  Patient identified by MRN, date of birth, ID band Patient awake    Reviewed: Allergy & Precautions, NPO status , Patient's Chart, lab work & pertinent test results  Airway Mallampati: III       Dental  (+) Upper Dentures   Pulmonary Current Smoker,     + decreased breath sounds      Cardiovascular Exercise Tolerance: Good hypertension, Pt. on home beta blockers Normal cardiovascular exam     Neuro/Psych PSYCHIATRIC DISORDERS Anxiety Depression    GI/Hepatic Neg liver ROS, GERD  ,  Endo/Other  Hypothyroidism   Renal/GU negative Renal ROS     Musculoskeletal negative musculoskeletal ROS (+)   Abdominal Normal abdominal exam  (+)   Peds  Hematology  (+) anemia ,   Anesthesia Other Findings   Reproductive/Obstetrics                             Anesthesia Physical  Anesthesia Plan  ASA: II  Anesthesia Plan: General   Post-op Pain Management:    Induction: Intravenous  Airway Management Planned: Nasal Cannula  Additional Equipment:   Intra-op Plan:   Post-operative Plan:   Informed Consent: I have reviewed the patients History and Physical, chart, labs and discussed the procedure including the risks, benefits and alternatives for the proposed anesthesia with the patient or authorized representative who has indicated his/her understanding and acceptance.     Plan Discussed with: CRNA  Anesthesia Plan Comments:         Anesthesia Quick Evaluation

## 2016-12-28 NOTE — Anesthesia Post-op Follow-up Note (Cosign Needed)
Anesthesia QCDR form completed.        

## 2016-12-28 NOTE — Op Note (Signed)
[facility] Gastroenterology Patient Name: Pamela Avila Procedure Date: 12/28/2016 10:41 AM MRN: [id number] Account #: [id number] Date of Birth: 29-Mar-1971 Admit Type: Outpatient Age: 46 Room: [facility] ENDO ROOM 4 Gender: Female Note Status: Finalized Procedure:            Upper GI endoscopy Indications:          Dyspepsia Providers:            Wyline Mood MD, MD Referring MD:         No Local Md, MD (Referring MD) Medicines:            Monitored Anesthesia Care Complications:        No immediate complications. Procedure:            Pre-Anesthesia Assessment:                       - Prior to the procedure, a History and Physical was                        performed, and patient medications, allergies and                        sensitivities were reviewed. The patient's tolerance of                        previous anesthesia was reviewed.                       - The risks and benefits of the procedure and the                        sedation options and risks were discussed with the                        patient. All questions were answered and informed                        consent was obtained.                       - ASA Grade Assessment: II - A patient with mild                        systemic disease.                       After obtaining informed consent, the endoscope was                        passed under direct vision. Throughout the procedure,                        the patient's blood pressure, pulse, and oxygen                        saturations were monitored continuously. The Endoscope                        was introduced through the mouth, and advanced to the  third part of duodenum. The upper GI endoscopy was                        accomplished with ease. The patient tolerated the                        procedure well. Findings:      Localized moderate inflammation characterized by congestion (edema) and       erythema was found  in the duodenal bulb. Biopsies were taken with a cold       forceps for histology.      Diffuse mild inflammation characterized by congestion (edema) and       erythema was found in the entire examined stomach. Biopsies were taken       with a cold forceps for histology.      A 4 cm hiatal hernia was present.      LA Grade A (one or more mucosal breaks less than 5 mm, not extending       between tops of 2 mucosal folds) esophagitis with no bleeding was found       in the lower third of the esophagus. Biopsies were taken with a cold       forceps for histology. Impression:           - Duodenitis. Biopsied.                       - Gastritis. Biopsied.                       - 4 cm hiatal hernia.                       - LA Grade A reflux esophagitis. Biopsied. Recommendation:       - Await pathology results.                       - Use Prilosec (omeprazole) 40 mg PO daily for 6 weeks.                       - Repeat upper endoscopy in 8 weeks to evaluate the                        response to therapy.                       - Perform a colonoscopy today. Procedure Code(s):    --- Professional ---                       [phone], Esophagogastroduodenoscopy, flexible, transoral;                        with biopsy, single or multiple Diagnosis Code(s):    --- Professional ---                       K29.80, Duodenitis without bleeding                       K29.70, Gastritis, unspecified, without bleeding                       K44.9, Diaphragmatic hernia without obstruction  or                        gangrene                       K21.0, Gastro-esophageal reflux disease with esophagitis                       R10.13, Epigastric pain CPT copyright 2016 American Medical Association. All rights reserved. The codes documented in this report are preliminary and upon coder review may  be revised to meet current compliance requirements. Wyline Mood, MD Wyline Mood MD, MD 12/28/2016 10:54:58 AM This report has  been signed electronically. Number of Addenda: 0 Note Initiated On: 12/28/2016 10:41 AM Total Procedure Duration: 0 hours 5 minutes 29 seconds       [facility]

## 2016-12-29 LAB — FECAL LACTOFERRIN, QUANT: Lactoferrin, Fecal, Quant.: <1 ug/mL(g) (ref 0.00–7.24)

## 2016-12-31 LAB — SURGICAL PATHOLOGY

## 2017-01-03 ENCOUNTER — Telehealth: Payer: Self-pay

## 2017-01-03 ENCOUNTER — Encounter: Payer: Self-pay | Admitting: Gastroenterology

## 2017-01-03 NOTE — Telephone Encounter (Signed)
Attempted to contact pt x 2 to relay results.   Voicemail is full.   c diff , H pylori -negative

## 2017-01-10 ENCOUNTER — Encounter: Payer: Self-pay | Admitting: Gastroenterology

## 2017-01-21 ENCOUNTER — Ambulatory Visit: Payer: BLUE CROSS/BLUE SHIELD | Admitting: Family Medicine

## 2017-01-28 ENCOUNTER — Ambulatory Visit (INDEPENDENT_AMBULATORY_CARE_PROVIDER_SITE_OTHER): Payer: BLUE CROSS/BLUE SHIELD | Admitting: Family Medicine

## 2017-01-28 ENCOUNTER — Encounter: Payer: Self-pay | Admitting: Family Medicine

## 2017-01-28 VITALS — BP 111/75 | HR 100 | Temp 98.8°F | Wt 165.1 lb

## 2017-01-28 DIAGNOSIS — F901 Attention-deficit hyperactivity disorder, predominantly hyperactive type: Secondary | ICD-10-CM

## 2017-01-28 MED ORDER — OMEPRAZOLE 20 MG PO CPDR: 20.0000 mg | DELAYED_RELEASE_CAPSULE | Freq: Two times a day (BID) | ORAL | 1 refills | Status: DC

## 2017-01-28 MED ORDER — BUSPIRONE HCL 5 MG PO TABS: 5.0000 mg | ORAL_TABLET | Freq: Three times a day (TID) | ORAL | 1 refills | Status: DC | PRN

## 2017-01-28 MED ORDER — AMPHETAMINE-DEXTROAMPHET ER 20 MG PO CP24: 20.0000 mg | ORAL_CAPSULE | ORAL | 0 refills | Status: DC

## 2017-01-28 NOTE — Progress Notes (Signed)
BP 111/75 (BP Location: Left Arm, Patient Position: Sitting, Cuff Size: Normal)   Pulse 100   Temp 98.8 F (37.1 C)   Wt 165 lb 1.6 oz (74.9 kg)   SpO2 100%   BMI 31.20 kg/m    Subjective:    Patient ID: Pamela Avila, female    DOB: 1971/02/13, 46 y.o.   MRN: [id number]  HPI: Pamela Avila is a 46 y.o. female  Chief Complaint  Patient presents with  . ADHD   ADHD FOLLOW UP ADHD status: better Satisfied with current therapy: no- feels like she could be better, starting to run down about an hour before she takes her medicine Medication compliance:  excellent compliance Controlled substance contract: yes Previous psychiatry evaluation: no Previous medications: yes    Taking meds on weekends/vacations: yes Work/school performance:  good- loosing her job at the end of the month and unsure when she will have insurance again.  Difficulty sustaining attention/completing tasks: yes Distracted by extraneous stimuli: no Does not listen when spoken to: no  Fidgets with hands or feet: no Unable to stay in seat: no Blurts out/interrupts others: no ADHD Medication Side Effects: no    Decreased appetite: no    Headache: no    Sleeping disturbance pattern: no    Irritability: no    Rebound effects (worse than baseline) off medication: no    Anxiousness: no    Dizziness: no    Tics: no  Relevant past medical, surgical, family and social history reviewed and updated as indicated. Interim medical history since our last visit reviewed. Allergies and medications reviewed and updated.  Review of Systems  Constitutional: Negative.   Respiratory: Negative.   Cardiovascular: Negative.   Psychiatric/Behavioral: Negative.     Per HPI unless specifically indicated above     Objective:    BP 111/75 (BP Location: Left Arm, Patient Position: Sitting, Cuff Size: Normal)   Pulse 100   Temp 98.8 F (37.1 C)   Wt 165 lb 1.6 oz (74.9 kg)   SpO2 100%   BMI 31.20 kg/m   Wt Readings from  Last 3 Encounters:  01/28/17 165 lb 1.6 oz (74.9 kg)  12/28/16 168 lb (76.2 kg)  12/24/16 168 lb (76.2 kg)    Physical Exam  Constitutional: She is oriented to person, place, and time. She appears well-developed and well-nourished. No distress.  HENT:  Head: Normocephalic and atraumatic.  Right Ear: Hearing normal.  Left Ear: Hearing normal.  Nose: Nose normal.  Eyes: Conjunctivae and lids are normal. Right eye exhibits no discharge. Left eye exhibits no discharge. No scleral icterus.  Cardiovascular: Normal rate, regular rhythm, normal heart sounds and intact distal pulses.  Exam reveals no gallop and no friction rub.   No murmur heard. Pulmonary/Chest: Effort normal and breath sounds normal. No respiratory distress. She has no wheezes. She has no rales. She exhibits no tenderness.  Musculoskeletal: Normal range of motion.  Neurological: She is alert and oriented to person, place, and time.  Skin: Skin is warm, dry and intact. No rash noted. No erythema. No pallor.  Psychiatric: She has a normal mood and affect. Her speech is normal and behavior is normal. Judgment and thought content normal. Cognition and memory are normal.  Nursing note and vitals reviewed.   Results for orders placed or performed during the hospital encounter of 12/28/16  Pregnancy, urine POC  Result Value Ref Range   Preg Test, Ur NEGATIVE NEGATIVE  Surgical pathology  Result Value  Ref Range   SURGICAL PATHOLOGY      Surgical Pathology CASE: [id number] PATIENT: Sindee Cohea Surgical Pathology Report     SPECIMEN SUBMITTED: A. Duodenum bulb, R/O duodenitis; cbx B. Stomach, R/O H pylori; cbx C. Esophagus, R/O esophagitis; cbx D. Colon, random, R/O microscopic colitis; cbx  CLINICAL HISTORY: None provided  PRE-OPERATIVE DIAGNOSIS: Loose stool  POST-OPERATIVE DIAGNOSIS: Gastritis, esophagitis, hemorrhoids, hiatal hernia, duodenitis     DIAGNOSIS: A. DUODENUM BULB; COLD BIOPSY: - MILD  CHRONIC NONSPECIFIC DUODENITIS. - NEGATIVE FOR VIRUS CYTOPATHIC EFFECT, DYSPLASIA, AND MALIGNANCY.  B. STOMACH; COLD BIOPSY: - ANTRAL AND OXYNTIC MUCOSA WITH MILD CHRONIC NONSPECIFIC GASTRITIS. - NEGATIVE FOR H. PYLORI, DYSPLASIA, AND MALIGNANCY.  C. ESOPHAGUS; COLD BIOPSY: - SQUAMOUS MUCOSA WITH RARE INTRAEPITHELIAL EOSINOPHILS (UP TO 3 INTRAEPITHELIAL EOSINOPHILS IN 20 HIGH-POWER FIELDS), SEE COMMENT. - NEGATIVE FOR DYSPLASIA AND MALIGNANCY.  D. COLON, RANDOM; COLD BIOPSY: - UNRE MARKABLE COLONIC MUCOSA.  Comment: The presence of rare intraepithelial eosinophils in the esophageal biopsy (specimen C) may be compatible with reflux esophagitis. Correlation with clinical impression and endoscopic findings is required.   GROSS DESCRIPTION:  A. Labeled: C BX duodenal bulb rule out duodenitis  Tissue fragment(s): 1  Size: 0.4 cm  Description: pink-tan fragment  Entirely submitted in 1 cassette(s).   B. Labeled: C BX gastric rule out H pylori  Tissue fragment(s): 4  Size: 0.2-0.5 cm  Description: pink-tan fragments  Entirely submitted in 1 cassette(s).  C. Labeled: C BX esophagus rule out esophagitis  Tissue fragment(s): 1  Size: 0.25 cm  Description: pink fragment  Entirely submitted in 1 cassette(s).  D. Labeled: C BX random colon rule out microscopic colitis  Tissue fragment(s): multiple  Size: aggregate, 1.0 x 0.6 x 0.1 cm  Description: pink-tan fragments  Entirely submitted in one cassette(s).    Final Diagnosi s performed by Elijah Birkara Rubinas, MD.  Electronically signed 12/31/2016 40:[id number]:48:44AM    The electronic signature indicates that the named Attending Pathologist has evaluated the specimen  Technical component performed at [facility], [address], [place], [place] [address] Lab: [phone] Dir: Titus DubinWilliam F. Cato MulliganHancock, MD  Professional component performed at [facility], [facility], [address], [place], [place] [address] Lab:  [phone] Dir: Georgiann Cockerara C. Rubinas, MD        Assessment & Plan:   Problem List Items Addressed This Visit      Other   ADHD (attention deficit hyperactivity disorder) - Primary    Still not quite there, will increase adderall to 20mg  ER and recheck in 2 weeks before she loses her insurance. 3 month supply given as she is going to be losing her insurance. Continue to monitor.           Follow up plan: Return in about 2 weeks (around 02/11/2017) for Recheck ADD before she loses her insurance.

## 2017-01-28 NOTE — Assessment & Plan Note (Signed)
Still not quite there, will increase adderall to 20mg  ER and recheck in 2 weeks before she loses her insurance. 3 month supply given as she is going to be losing her insurance. Continue to monitor.

## 2017-02-05 ENCOUNTER — Encounter: Payer: Self-pay | Admitting: Family Medicine

## 2017-02-05 ENCOUNTER — Ambulatory Visit (INDEPENDENT_AMBULATORY_CARE_PROVIDER_SITE_OTHER): Payer: BLUE CROSS/BLUE SHIELD | Admitting: Family Medicine

## 2017-02-05 VITALS — BP 129/84 | HR 99 | Temp 98.8°F | Wt 167.0 lb

## 2017-02-05 DIAGNOSIS — F901 Attention-deficit hyperactivity disorder, predominantly hyperactive type: Secondary | ICD-10-CM

## 2017-02-05 MED ORDER — AMPHETAMINE-DEXTROAMPHETAMINE 10 MG PO TABS: ORAL_TABLET | ORAL | 0 refills | Status: DC

## 2017-02-05 NOTE — Assessment & Plan Note (Signed)
Better, but still not quite there. Will continue her 20mg  ER and will add 5mg  of adderall in the PM as needed. 3 month supply given today due to losing her insurance. Call with any concerns.

## 2017-02-05 NOTE — Progress Notes (Signed)
BP 129/84 (BP Location: Right Arm, Patient Position: Sitting, Cuff Size: Normal)   Pulse 99   Temp 98.8 F (37.1 C)   Wt 167 lb (75.8 kg) Comment: Patient reported  SpO2 100%   BMI 31.55 kg/m    Subjective:    Patient ID: Pamela Avila, female    DOB: 08-21-1970, 46 y.o.   MRN: [id number]  HPI: Pamela FlowKaren C Avila is a 46 y.o. female  Chief Complaint  Patient presents with  . ADHD   ADHD FOLLOW UP- doing well on the ER- she likes it, but notes that it only lasts about 7 hours, needs something occasionally in the afternoon ADHD status: better Satisfied with current therapy: yes Medication compliance:  excellent compliance Controlled substance contract: yes Previous psychiatry evaluation: no Previous medications: yes    Taking meds on weekends/vacations: yes Work/school performance:  good Difficulty sustaining attention/completing tasks: yes Distracted by extraneous stimuli: no Does not listen when spoken to: no  Fidgets with hands or feet: no Unable to stay in seat: no Blurts out/interrupts others: yes ADHD Medication Side Effects: no  Relevant past medical, surgical, family and social history reviewed and updated as indicated. Interim medical history since our last visit reviewed. Allergies and medications reviewed and updated.  Review of Systems  Constitutional: Negative.   Respiratory: Negative.   Cardiovascular: Negative.   Psychiatric/Behavioral: Positive for decreased concentration. Negative for agitation, behavioral problems, confusion, dysphoric mood, hallucinations, self-injury, sleep disturbance and suicidal ideas. The patient is not nervous/anxious and is not hyperactive.     Per HPI unless specifically indicated above     Objective:    BP 129/84 (BP Location: Right Arm, Patient Position: Sitting, Cuff Size: Normal)   Pulse 99   Temp 98.8 F (37.1 C)   Wt 167 lb (75.8 kg) Comment: Patient reported  SpO2 100%   BMI 31.55 kg/m   Wt Readings from Last 3  Encounters:  02/05/17 167 lb (75.8 kg)  01/28/17 165 lb 1.6 oz (74.9 kg)  12/28/16 168 lb (76.2 kg)    Physical Exam  Constitutional: She is oriented to person, place, and time. She appears well-developed and well-nourished. No distress.  HENT:  Head: Normocephalic and atraumatic.  Right Ear: Hearing normal.  Left Ear: Hearing normal.  Nose: Nose normal.  Eyes: Conjunctivae and lids are normal. Right eye exhibits no discharge. Left eye exhibits no discharge. No scleral icterus.  Cardiovascular: Normal rate, regular rhythm, normal heart sounds and intact distal pulses.  Exam reveals no gallop and no friction rub.   No murmur heard. Pulmonary/Chest: Effort normal and breath sounds normal. No respiratory distress. She has no wheezes. She has no rales. She exhibits no tenderness.  Musculoskeletal: Normal range of motion.  Neurological: She is alert and oriented to person, place, and time.  Skin: Skin is warm, dry and intact. No rash noted. She is not diaphoretic. No erythema. No pallor.  Psychiatric: She has a normal mood and affect. Her speech is normal and behavior is normal. Judgment and thought content normal. Cognition and memory are normal.  Nursing note and vitals reviewed.      Assessment & Plan:   Problem List Items Addressed This Visit      Other   ADHD (attention deficit hyperactivity disorder) - Primary    Better, but still not quite there. Will continue her 20mg  ER and will add 5mg  of adderall in the PM as needed. 3 month supply given today due to losing her insurance. Call  with any concerns.           Follow up plan: Return in about 3 months (around 05/08/2017) for ADD follow up.

## 2017-02-06 ENCOUNTER — Other Ambulatory Visit: Payer: Self-pay | Admitting: Family Medicine

## 2017-02-06 MED ORDER — AMPHETAMINE-DEXTROAMPHETAMINE 10 MG PO TABS: ORAL_TABLET | ORAL | 0 refills | Status: DC

## 2017-02-07 ENCOUNTER — Ambulatory Visit: Payer: BLUE CROSS/BLUE SHIELD | Admitting: Family Medicine

## 2017-02-07 ENCOUNTER — Telehealth: Payer: Self-pay

## 2017-02-07 NOTE — Telephone Encounter (Signed)
Patient wanted to let us know that the [facility] on [address] did not have her medication in stock so she had to get it filled at the [facility] on [address]. She wanted to let us know since she is under a controlled substance agreement.

## 2017-02-25 ENCOUNTER — Telehealth: Payer: Self-pay

## 2017-02-25 NOTE — Telephone Encounter (Signed)
LVM for patient callback for check-up call per Dr. Tobi BastosAnna.  Enquire how she is doing- does she have a follow up . Her esophageal biopsies did show a few eosinophils which can be seen in acid reflux or in allergic conditions./ Enquire if has any difficulty swallowing

## 2017-02-25 NOTE — Telephone Encounter (Signed)
-----   Message from Wyline MoodKiran Anna, MD sent at 02/24/2017  6:51 PM EDT ----- Enquire how she is doing- does she have a follow up . Her esophageal biopsies did show a few eosinophils which can be seen in acid reflux or in allergic conditions./ Enquire if has any difficulty swallowing

## 2017-05-16 ENCOUNTER — Ambulatory Visit (INDEPENDENT_AMBULATORY_CARE_PROVIDER_SITE_OTHER): Payer: Self-pay | Admitting: Family Medicine

## 2017-05-16 ENCOUNTER — Encounter: Payer: Self-pay | Admitting: Family Medicine

## 2017-05-16 VITALS — BP 120/81 | HR 95 | Temp 97.9°F | Wt 169.0 lb

## 2017-05-16 DIAGNOSIS — J069 Acute upper respiratory infection, unspecified: Secondary | ICD-10-CM

## 2017-05-16 MED ORDER — AMOXICILLIN-POT CLAVULANATE 875-125 MG PO TABS: 1.0000 | ORAL_TABLET | Freq: Two times a day (BID) | ORAL | 0 refills | Status: DC

## 2017-05-16 NOTE — Progress Notes (Signed)
   BP 120/81   Pulse 95   Temp 97.9 F (36.6 C)   Wt 169 lb (76.7 kg)   SpO2 99%   BMI 31.93 kg/m    Subjective:    Patient ID: Pamela Avila, female    DOB: 11-Jun-1971, 46 y.o.   MRN: [id number]  HPI: [name] is a 46 y.o. female  Chief Complaint  Patient presents with  . URI    x 2 weeks, head/chest congestion, productive cough, nasal congestion, sinus draiange, sore throat, right ear ache, h/a. No fever.    Patient with 2+ weeks of hoarseness, sore throat, HA, congestion, productive cough, facial pressure, right ear, chest pressure. Denies fever, chills, sweats, CP, SOB. Trying mucinex, sudafed, and robitussin DM with some temporary relief. Lots of sick contacts.   Relevant past medical, surgical, family and social history reviewed and updated as indicated. Interim medical history since our last visit reviewed. Allergies and medications reviewed and updated.  Review of Systems  Constitutional: Negative.   HENT: Positive for congestion, ear pain, sinus pain, sinus pressure and sore throat.   Respiratory: Positive for cough and chest tightness.   Cardiovascular: Negative.   Gastrointestinal: Negative.   Musculoskeletal: Negative.   Skin: Negative.   Neurological: Negative.   Psychiatric/Behavioral: Negative.    Per HPI unless specifically indicated above     Objective:    BP 120/81   Pulse 95   Temp 97.9 F (36.6 C)   Wt 169 lb (76.7 kg)   SpO2 99%   BMI 31.93 kg/m   Wt Readings from Last 3 Encounters:  05/16/17 169 lb (76.7 kg)  02/05/17 167 lb (75.8 kg)  01/28/17 165 lb 1.6 oz (74.9 kg)    Physical Exam  Constitutional: She is oriented to person, place, and time. She appears well-developed and well-nourished. No distress.  HENT:  Head: Normocephalic.  Right Ear: External ear normal.  Left Ear: External ear normal.  Mouth/Throat: No oropharyngeal exudate.  Nasal mucosa and oropharynx erythematous  Eyes: Pupils are equal, round, and reactive to light.  Conjunctivae are normal. No scleral icterus.  Neck: Normal range of motion. Neck supple.  Cardiovascular: Normal rate and normal heart sounds.   Pulmonary/Chest: Breath sounds normal. No respiratory distress. She has no wheezes. She has no rales.  Musculoskeletal: Normal range of motion.  Neurological: She is alert and oriented to person, place, and time.  Skin: Skin is warm and dry.  Psychiatric: She has a normal mood and affect. Her behavior is normal.  Nursing note and vitals reviewed.     Assessment & Plan:   Problem List Items Addressed This Visit    None    Visit Diagnoses    Upper respiratory tract infection, unspecified type    -  Primary   Will treat with augmentin, continue OTC products prn, humidifier, sinus rinses, rest, good hydration. F/u if worsening or no improvement       Follow up plan: Return if symptoms worsen or fail to improve.

## 2017-05-16 NOTE — Patient Instructions (Signed)
Follow up as needed

## 2017-06-05 ENCOUNTER — Ambulatory Visit (INDEPENDENT_AMBULATORY_CARE_PROVIDER_SITE_OTHER): Payer: Self-pay | Admitting: Family Medicine

## 2017-06-05 ENCOUNTER — Encounter: Payer: Self-pay | Admitting: Family Medicine

## 2017-06-05 VITALS — BP 129/86 | HR 92 | Temp 98.5°F | Wt 170.6 lb

## 2017-06-05 DIAGNOSIS — F329 Major depressive disorder, single episode, unspecified: Secondary | ICD-10-CM

## 2017-06-05 DIAGNOSIS — F32A Depression, unspecified: Secondary | ICD-10-CM

## 2017-06-05 DIAGNOSIS — F419 Anxiety disorder, unspecified: Secondary | ICD-10-CM

## 2017-06-05 DIAGNOSIS — F901 Attention-deficit hyperactivity disorder, predominantly hyperactive type: Secondary | ICD-10-CM

## 2017-06-05 DIAGNOSIS — I10 Essential (primary) hypertension: Secondary | ICD-10-CM

## 2017-06-05 MED ORDER — AMPHETAMINE-DEXTROAMPHETAMINE 10 MG PO TABS: ORAL_TABLET | ORAL | 0 refills | Status: DC

## 2017-06-05 MED ORDER — OMEPRAZOLE 20 MG PO CPDR: 20.0000 mg | DELAYED_RELEASE_CAPSULE | Freq: Two times a day (BID) | ORAL | 1 refills | Status: DC

## 2017-06-05 MED ORDER — AMPHETAMINE-DEXTROAMPHET ER 30 MG PO CP24: 30.0000 mg | ORAL_CAPSULE | ORAL | 0 refills | Status: DC

## 2017-06-05 MED ORDER — BUSPIRONE HCL 5 MG PO TABS: 5.0000 mg | ORAL_TABLET | Freq: Three times a day (TID) | ORAL | 1 refills | Status: DC | PRN

## 2017-06-05 MED ORDER — METOPROLOL SUCCINATE ER 25 MG PO TB24: 37.5000 mg | ORAL_TABLET | Freq: Every day | ORAL | 2 refills | Status: DC

## 2017-06-05 MED ORDER — CITALOPRAM HYDROBROMIDE 40 MG PO TABS: 60.0000 mg | ORAL_TABLET | Freq: Every day | ORAL | 1 refills | Status: DC

## 2017-06-05 MED ORDER — LEVOTHYROXINE SODIUM 88 MCG PO TABS: 88.0000 ug | ORAL_TABLET | Freq: Every day | ORAL | 2 refills | Status: DC

## 2017-06-05 NOTE — Assessment & Plan Note (Signed)
Under good control. Continue current regimen. Continue to monitor. Call with any concerns. Refills given.  

## 2017-06-05 NOTE — Progress Notes (Signed)
BP 129/86 (BP Location: Left Arm, Patient Position: Sitting, Cuff Size: Normal)   Pulse 92   Temp 98.5 F (36.9 C)   Wt 170 lb 9 oz (77.4 kg)   SpO2 100%   BMI 32.23 kg/m    Subjective:    Patient ID: Pamela Avila, female    DOB: 1971-06-14, 46 y.o.   MRN: [id number]  HPI: Pamela Avila is a 46 y.o. female  Chief Complaint  Patient presents with  . ADD    Patient does not currently have isurance, she will call around to find out who is the cheapest.    ADHD FOLLOW UP- has been out of the 20mg  for at least a month because she doesn't have insurance. She notes that the 20mg  seemed to be doing well. She notes that it doesn't seem to last long enough, seems to last about 6-7 hour and then wears off, short acting doesn't seem to do enough ADHD status: uncontrolled Satisfied with current therapy: no Medication compliance:  good compliance Controlled substance contract: yes Previous psychiatry evaluation: yes Previous medications: yes    Taking meds on weekends/vacations: yes Work/school performance:  average Difficulty sustaining attention/completing tasks: yes Distracted by extraneous stimuli: yes Does not listen when spoken to: yes  Fidgets with hands or feet: yes Unable to stay in seat: yes Blurts out/interrupts others: yes ADHD Medication Side Effects: no    Decreased appetite: no    Headache: no    Sleeping disturbance pattern: no    Irritability: no    Rebound effects (worse than baseline) off medication: no    Anxiousness: no    Dizziness: no    Tics: no  Depression screen [facility] 2/9 06/05/2017 06/05/2017 07/03/2016 02/10/2016  Decreased Interest 0 0 0 1  Down, Depressed, Hopeless 0 0 0 0  PHQ - 2 Score 0 0 0 1  Altered sleeping 1 - 3 -  Tired, decreased energy 1 - 2 -  Change in appetite 0 - 0 -  Feeling bad or failure about yourself  0 - 0 -  Trouble concentrating 3 - 2 -  Moving slowly or fidgety/restless 1 - 0 -  Suicidal thoughts 0 - 0 -  PHQ-9 Score 6 - 7  -  Difficult doing work/chores Somewhat difficult - - -   HYPERTENSION Hypertension status: controlled  Satisfied with current treatment? yes Duration of hypertension: chronic BP monitoring frequency:  not checking BP medication side effects:  no Medication compliance: excellent compliance Previous BP meds:metoprolol Aspirin: no Recurrent headaches: no Visual changes: no Palpitations: no Dyspnea: no Chest pain: no Lower extremity edema: no Dizzy/lightheaded: no   DEPRESSION Mood status: controlled Satisfied with current treatment?: yes Symptom severity: mild  Duration of current treatment : chronic Side effects: no Medication compliance: excellent compliance Psychotherapy/counseling: no  Depressed mood: no Anxious mood: no Anhedonia: no Significant weight loss or gain: no Insomnia: no  Fatigue: no Feelings of worthlessness or guilt: no Impaired concentration/indecisiveness: no Suicidal ideations: no Hopelessness: no Crying spells: no Depression screen [facility] 2/9 06/05/2017 06/05/2017 07/03/2016 02/10/2016  Decreased Interest 0 0 0 1  Down, Depressed, Hopeless 0 0 0 0  PHQ - 2 Score 0 0 0 1  Altered sleeping 1 - 3 -  Tired, decreased energy 1 - 2 -  Change in appetite 0 - 0 -  Feeling bad or failure about yourself  0 - 0 -  Trouble concentrating 3 - 2 -  Moving slowly or fidgety/restless 1 -  0 -  Suicidal thoughts 0 - 0 -  PHQ-9 Score 6 - 7 -  Difficult doing work/chores Somewhat difficult - - -     Relevant past medical, surgical, family and social history reviewed and updated as indicated. Interim medical history since our last visit reviewed. Allergies and medications reviewed and updated.  Review of Systems  Constitutional: Negative.   Respiratory: Negative.   Cardiovascular: Negative.   Psychiatric/Behavioral: Positive for decreased concentration. Negative for agitation, behavioral problems, confusion, dysphoric mood, hallucinations, self-injury, sleep  disturbance and suicidal ideas. The patient is not nervous/anxious and is not hyperactive.     Per HPI unless specifically indicated above     Objective:    BP 129/86 (BP Location: Left Arm, Patient Position: Sitting, Cuff Size: Normal)   Pulse 92   Temp 98.5 F (36.9 C)   Wt 170 lb 9 oz (77.4 kg)   SpO2 100%   BMI 32.23 kg/m   Wt Readings from Last 3 Encounters:  06/05/17 170 lb 9 oz (77.4 kg)  05/16/17 169 lb (76.7 kg)  02/05/17 167 lb (75.8 kg)    Physical Exam  Constitutional: She is oriented to person, place, and time. She appears well-developed and well-nourished. No distress.  HENT:  Head: Normocephalic and atraumatic.  Right Ear: Hearing normal.  Left Ear: Hearing normal.  Nose: Nose normal.  Eyes: Conjunctivae and lids are normal. Right eye exhibits no discharge. Left eye exhibits no discharge. No scleral icterus.  Cardiovascular: Normal rate, regular rhythm, normal heart sounds and intact distal pulses.  Exam reveals no gallop and no friction rub.   No murmur heard. Pulmonary/Chest: Effort normal and breath sounds normal. No respiratory distress. She has no wheezes. She has no rales. She exhibits no tenderness.  Musculoskeletal: Normal range of motion.  Neurological: She is alert and oriented to person, place, and time.  Skin: Skin is warm, dry and intact. No rash noted. She is not diaphoretic. No erythema. No pallor.  Psychiatric: She has a normal mood and affect. Her speech is normal and behavior is normal. Judgment and thought content normal. Cognition and memory are normal.  Nursing note and vitals reviewed.   Results for orders placed or performed during the hospital encounter of 12/28/16  Pregnancy, urine POC  Result Value Ref Range   Preg Test, Ur NEGATIVE NEGATIVE  Surgical pathology  Result Value Ref Range   SURGICAL PATHOLOGY      Surgical Pathology CASE: [id number] PATIENT: Pamela Avila Surgical Pathology Report     SPECIMEN SUBMITTED: A.  Duodenum bulb, R/O duodenitis; cbx B. Stomach, R/O H pylori; cbx C. Esophagus, R/O esophagitis; cbx D. Colon, random, R/O microscopic colitis; cbx  CLINICAL HISTORY: None provided  PRE-OPERATIVE DIAGNOSIS: Loose stool  POST-OPERATIVE DIAGNOSIS: Gastritis, esophagitis, hemorrhoids, hiatal hernia, duodenitis     DIAGNOSIS: A. DUODENUM BULB; COLD BIOPSY: - MILD CHRONIC NONSPECIFIC DUODENITIS. - NEGATIVE FOR VIRUS CYTOPATHIC EFFECT, DYSPLASIA, AND MALIGNANCY.  B. STOMACH; COLD BIOPSY: - ANTRAL AND OXYNTIC MUCOSA WITH MILD CHRONIC NONSPECIFIC GASTRITIS. - NEGATIVE FOR H. PYLORI, DYSPLASIA, AND MALIGNANCY.  C. ESOPHAGUS; COLD BIOPSY: - SQUAMOUS MUCOSA WITH RARE INTRAEPITHELIAL EOSINOPHILS (UP TO 3 INTRAEPITHELIAL EOSINOPHILS IN 20 HIGH-POWER FIELDS), SEE COMMENT. - NEGATIVE FOR DYSPLASIA AND MALIGNANCY.  D. COLON, RANDOM; COLD BIOPSY: - UNRE MARKABLE COLONIC MUCOSA.  Comment: The presence of rare intraepithelial eosinophils in the esophageal biopsy (specimen C) may be compatible with reflux esophagitis. Correlation with clinical impression and endoscopic findings is required.   GROSS DESCRIPTION:  A. Labeled: C BX duodenal bulb rule out duodenitis  Tissue fragment(s): 1  Size: 0.4 cm  Description: pink-tan fragment  Entirely submitted in 1 cassette(s).   B. Labeled: C BX gastric rule out H pylori  Tissue fragment(s): 4  Size: 0.2-0.5 cm  Description: pink-tan fragments  Entirely submitted in 1 cassette(s).  C. Labeled: C BX esophagus rule out esophagitis  Tissue fragment(s): 1  Size: 0.25 cm  Description: pink fragment  Entirely submitted in 1 cassette(s).  D. Labeled: C BX random colon rule out microscopic colitis  Tissue fragment(s): multiple  Size: aggregate, 1.0 x 0.6 x 0.1 cm  Description: pink-tan fragments  Entirely submitted in one cassette(s).    Final Diagnosi s performed by Elijah Birk, MD.  Electronically signed 12/31/2016  16:10:96EA    The electronic signature indicates that the named Attending Pathologist has evaluated the specimen  Technical component performed at [facility], [address], [place], [place] [address] Lab: [phone] Dir: Titus Dubin. Cato Mulligan, MD  Professional component performed at [facility], [facility], [address] [place], [place], [place] [address] Lab: [phone] Dir: Georgiann Cocker. Oneita Kras, MD        Assessment & Plan:   Problem List Items Addressed This Visit      Cardiovascular and Mediastinum   Essential hypertension, benign    Under good control. Continue current regimen. Continue to monitor. Call with any concerns. Refills given.       Relevant Medications   metoprolol succinate (TOPROL-XL) 25 MG 24 hr tablet     Other   ADHD (attention deficit hyperactivity disorder) - Primary    Not under great control. Will increase her adderall to 30mg  and continue PRN 10mg  for long days. Recheck 1 month. Call with any concerns.       Anxiety and depression    Under good control. Continue current regimen. Continue to monitor. Call with any concerns. Refills given.       Relevant Medications   busPIRone (BUSPAR) 5 MG tablet   citalopram (CELEXA) 40 MG tablet       Follow up plan: Return in about 4 weeks (around 07/03/2017) for Follow up ADD.

## 2017-06-05 NOTE — Assessment & Plan Note (Signed)
Not under great control. Will increase her adderall to 30mg  and continue PRN 10mg  for long days. Recheck 1 month. Call with any concerns.

## 2017-06-06 ENCOUNTER — Telehealth: Payer: Self-pay | Admitting: Family Medicine

## 2017-06-06 NOTE — Telephone Encounter (Signed)
Copied from CRM #[id number]. Topic: Quick Communication - See Telephone Encounter >> Jun 06, 2017 12:15 PM Terisa Starraylor, Brittany L wrote: CRM for notification. See Telephone encounter for:  06/06/17. DR Laural BenesJOHNSON wrote script for aderol for 180 days and pharmacy is saying that she can only do it for 90 days and she will loose the rest .[facility] [place] @ [phone].

## 2017-06-06 NOTE — Telephone Encounter (Signed)
Please review

## 2017-06-06 NOTE — Telephone Encounter (Signed)
90 is fine

## 2017-06-06 NOTE — Telephone Encounter (Signed)
Routing to provider  

## 2017-07-05 ENCOUNTER — Encounter: Payer: Self-pay | Admitting: Family Medicine

## 2017-07-05 ENCOUNTER — Ambulatory Visit (INDEPENDENT_AMBULATORY_CARE_PROVIDER_SITE_OTHER): Payer: Self-pay | Admitting: Family Medicine

## 2017-07-05 VITALS — BP 108/75 | HR 80 | Temp 98.2°F | Wt 173.0 lb

## 2017-07-05 DIAGNOSIS — M26609 Unspecified temporomandibular joint disorder, unspecified side: Secondary | ICD-10-CM

## 2017-07-05 DIAGNOSIS — F901 Attention-deficit hyperactivity disorder, predominantly hyperactive type: Secondary | ICD-10-CM

## 2017-07-05 MED ORDER — AMPHETAMINE-DEXTROAMPHET ER 30 MG PO CP24: 30.0000 mg | ORAL_CAPSULE | ORAL | 0 refills | Status: DC

## 2017-07-05 MED ORDER — AMPHETAMINE-DEXTROAMPHETAMINE 10 MG PO TABS: ORAL_TABLET | ORAL | 0 refills | Status: DC

## 2017-07-05 MED ORDER — CYCLOBENZAPRINE HCL 10 MG PO TABS: 10.0000 mg | ORAL_TABLET | Freq: Every day | ORAL | 0 refills | Status: DC

## 2017-07-05 NOTE — Assessment & Plan Note (Signed)
Under good control on current regimen. Continue current regimen. Continue to monitor. 3 month supply given today. Recheck 3 months.

## 2017-07-05 NOTE — Patient Instructions (Addendum)
Jaw Range of Motion Exercises Jaw range of motion exercises are exercises that help your jaw to move better. These exercises can help to prevent:  Difficulty opening your mouth.  Pain in your jaw while it is both open and closed.  What should I be careful of when doing jaw exercises? Make sure that you only do jaw exercises as directed by your health care provider. You should only move your jaw as far as it can go in each direction, if told to do so by your health care provider. Do not move your jaw into positions that cause you any pain. What exercises should I do?  Stick your jaw forward. Hold this position for 1-2 seconds. Allow your jaw to return to its normal position and rest it there for 1-2 seconds. Do this exercise 8 times.  Stand or sit in front of a mirror. Place your tongue on the roof of your mouth, just behind your top teeth. Slowly open and close your jaw, keeping your tongue on the roof of your mouth. While you open and close your mouth, try to keep your jaw from moving toward one side or the other. Repeat this 8 times.  Move your jaw right. Hold this position for 1-2 seconds. Allow your jaw to return to its normal position, and rest it there for 1-2 seconds. Do this exercise 8 times.  Move your jaw left. Hold this position for 1-2 seconds. Allow your jaw to return to its normal position, and rest it there for 1-2 seconds. Do this exercise 8 times.  Open your mouth as far as it is can comfortably go. Hold this position for 1-2 seconds. Then close your mouth and rest for 1-2 seconds. Do this exercise 8 times.  Move your jaw in a circular motion, starting toward the right (clockwise). Repeat this 8 times.  Move your jaw in a circular motion, starting toward the left (counterclockwise). Repeat this 8 times. Apply moist heat packs or ice packs to your jaw before or after performing your exercises as directed by your health care provider. What else can I do? Avoid the following,  if they cause jaw pain or they increase your jaw pain:  Chewing gum.  Clenching your jaw or teeth or keeping tension in your jaw muscles.  Leaning on your jaw, such as resting your jaw in your hand while leaning on a desk.  This information is not intended to replace advice given to you by your health care provider. Make sure you discuss any questions you have with your health care provider. Document Released: 07/12/2008 Document Revised: 01/05/2016 Document Reviewed: 06/30/2014 Elsevier Interactive Patient Education  2018 Elsevier Inc.  

## 2017-07-05 NOTE — Progress Notes (Signed)
BP 108/75 (BP Location: Left Arm, Patient Position: Sitting, Cuff Size: Normal)   Pulse 80   Temp 98.2 F (36.8 C)   Wt 173 lb (78.5 kg)   SpO2 100%   BMI 32.69 kg/m    Subjective:    Patient ID: Pamela Avila, female    DOB: 06/04/1971, 46 y.o.   MRN: [id number]  HPI: Pamela FlowKaren C Avila is a 46 y.o. female  Chief Complaint  Patient presents with  . ADD  . Oral Pain    under tongue left side   ADHD FOLLOW UP ADHD status: better Satisfied with current therapy: yes Medication compliance:  excellent compliance Controlled substance contract: yes Previous psychiatry evaluation: yes Taking meds on weekends/vactions: yes  Work/school performance:  good Difficulty sustaining attention/completing tasks: no Distracted by extraneous stimuli: no Does not listen when spoken to: no  Fidgets with hands or feet: no Unable to stay in seat: no Blurts out/interrupts others: no ADHD Medication Side Effects: no    Decreased appetite: no    Headache: no    Sleeping disturbance pattern: no    Irritability: no    Rebound effects (worse than baseline) off medication: no    Anxiousness: no    Dizziness: no    Tics: no   DENTAL PAIN Duration: a couple of months ago Involved teeth: no teeth- under tongue on the Left side Dentist evaluation: no Mechanism of injury:  no trauma Onset: sudden Severity: severe Quality: sharp, nerve-y pain Frequency: at random, weeks apart Radiation: none Aggravating factors: none Alleviating factors: none Status: fluctuating Treatments attempted: nothing Relief with NSAIDs?: No NSAIDs Taken Fevers: no Swelling: no Redness: no Paresthesias / decreased sensation: no Sinus pressure: no   Relevant past medical, surgical, family and social history reviewed and updated as indicated. Interim medical history since our last visit reviewed. Allergies and medications reviewed and updated.  Review of Systems  Constitutional: Negative.   Respiratory: Negative.    Cardiovascular: Negative.   Neurological: Positive for numbness. Negative for dizziness, tremors, seizures, syncope, facial asymmetry, speech difficulty, weakness, light-headedness and headaches.  Psychiatric/Behavioral: Negative.     Per HPI unless specifically indicated above     Objective:    BP 108/75 (BP Location: Left Arm, Patient Position: Sitting, Cuff Size: Normal)   Pulse 80   Temp 98.2 F (36.8 C)   Wt 173 lb (78.5 kg)   SpO2 100%   BMI 32.69 kg/m   Wt Readings from Last 3 Encounters:  07/05/17 173 lb (78.5 kg)  06/05/17 170 lb 9 oz (77.4 kg)  05/16/17 169 lb (76.7 kg)    Physical Exam  Constitutional: She is oriented to person, place, and time. She appears well-developed and well-nourished. No distress.  HENT:  Head: Normocephalic and atraumatic.  Right Ear: Hearing and external ear normal.  Left Ear: Hearing and external ear normal.  Nose: Nose normal.  Mouth/Throat: Oropharynx is clear and moist. No oropharyngeal exudate.  L sided jaw click   Eyes: Conjunctivae, EOM and lids are normal. Pupils are equal, round, and reactive to light. Right eye exhibits no discharge. Left eye exhibits no discharge. No scleral icterus.  Neck: Normal range of motion. Neck supple. No JVD present. No tracheal deviation present. No thyromegaly present.  Cardiovascular: Normal rate, regular rhythm, normal heart sounds and intact distal pulses. Exam reveals no gallop and no friction rub.  No murmur heard. Pulmonary/Chest: Effort normal and breath sounds normal. No stridor. No respiratory distress. She has no wheezes.  She has no rales. She exhibits no tenderness.  Musculoskeletal: Normal range of motion.  Lymphadenopathy:    She has no cervical adenopathy.  Neurological: She is alert and oriented to person, place, and time.  Skin: Skin is warm, dry and intact. No rash noted. She is not diaphoretic. No erythema. No pallor.  Psychiatric: She has a normal mood and affect. Her speech is  normal and behavior is normal. Judgment and thought content normal. Cognition and memory are normal.  Nursing note and vitals reviewed.   Results for orders placed or performed during the hospital encounter of 12/28/16  Pregnancy, urine POC  Result Value Ref Range   Preg Test, Ur NEGATIVE NEGATIVE  Surgical pathology  Result Value Ref Range   SURGICAL PATHOLOGY      Surgical Pathology CASE: [id number] PATIENT: Pamela Avila Surgical Pathology Report     SPECIMEN SUBMITTED: A. Duodenum bulb, R/O duodenitis; cbx B. Stomach, R/O H pylori; cbx C. Esophagus, R/O esophagitis; cbx D. Colon, random, R/O microscopic colitis; cbx  CLINICAL HISTORY: None provided  PRE-OPERATIVE DIAGNOSIS: Loose stool  POST-OPERATIVE DIAGNOSIS: Gastritis, esophagitis, hemorrhoids, hiatal hernia, duodenitis     DIAGNOSIS: A. DUODENUM BULB; COLD BIOPSY: - MILD CHRONIC NONSPECIFIC DUODENITIS. - NEGATIVE FOR VIRUS CYTOPATHIC EFFECT, DYSPLASIA, AND MALIGNANCY.  B. STOMACH; COLD BIOPSY: - ANTRAL AND OXYNTIC MUCOSA WITH MILD CHRONIC NONSPECIFIC GASTRITIS. - NEGATIVE FOR H. PYLORI, DYSPLASIA, AND MALIGNANCY.  C. ESOPHAGUS; COLD BIOPSY: - SQUAMOUS MUCOSA WITH RARE INTRAEPITHELIAL EOSINOPHILS (UP TO 3 INTRAEPITHELIAL EOSINOPHILS IN 20 HIGH-POWER FIELDS), SEE COMMENT. - NEGATIVE FOR DYSPLASIA AND MALIGNANCY.  D. COLON, RANDOM; COLD BIOPSY: - UNRE MARKABLE COLONIC MUCOSA.  Comment: The presence of rare intraepithelial eosinophils in the esophageal biopsy (specimen C) may be compatible with reflux esophagitis. Correlation with clinical impression and endoscopic findings is required.   GROSS DESCRIPTION:  A. Labeled: C BX duodenal bulb rule out duodenitis  Tissue fragment(s): 1  Size: 0.4 cm  Description: pink-tan fragment  Entirely submitted in 1 cassette(s).   B. Labeled: C BX gastric rule out H pylori  Tissue fragment(s): 4  Size: 0.2-0.5 cm  Description: pink-tan  fragments  Entirely submitted in 1 cassette(s).  C. Labeled: C BX esophagus rule out esophagitis  Tissue fragment(s): 1  Size: 0.25 cm  Description: pink fragment  Entirely submitted in 1 cassette(s).  D. Labeled: C BX random colon rule out microscopic colitis  Tissue fragment(s): multiple  Size: aggregate, 1.0 x 0.6 x 0.1 cm  Description: pink-tan fragments  Entirely submitted in one cassette(s).    Final Diagnosi s performed by Elijah Birk, MD.  Electronically signed 12/31/2016 40:98:11BJ    The electronic signature indicates that the named Attending Pathologist has evaluated the specimen  Technical component performed at [facility], [address], [place], [place] [address] Lab: [phone] Dir: Titus Dubin. Cato Mulligan, MD  Professional component performed at [facility], [facility], [address] [place], [place], [place] [address] Lab: [phone] Dir: Georgiann Cocker. Oneita Kras, MD        Assessment & Plan:   Problem List Items Addressed This Visit      Other   ADHD (attention deficit hyperactivity disorder) - Primary    Under good control on current regimen. Continue current regimen. Continue to monitor. 3 month supply given today. Recheck 3 months.        Other Visit Diagnoses    TMJ (temporomandibular joint disorder)       Will start exercises and flexeril. Call if not getting better or getting  worse.        Follow up plan: Return in about 3 months (around 10/05/2017) for ADD follow up.

## 2017-10-02 ENCOUNTER — Ambulatory Visit: Payer: Self-pay | Admitting: Family Medicine

## 2017-10-07 ENCOUNTER — Encounter: Payer: Self-pay | Admitting: Family Medicine

## 2017-10-07 ENCOUNTER — Ambulatory Visit: Payer: 59 | Admitting: Family Medicine

## 2017-10-07 VITALS — BP 113/67 | HR 78 | Temp 98.2°F | Wt 167.1 lb

## 2017-10-07 DIAGNOSIS — Z833 Family history of diabetes mellitus: Secondary | ICD-10-CM | POA: Diagnosis not present

## 2017-10-07 DIAGNOSIS — E038 Other specified hypothyroidism: Secondary | ICD-10-CM | POA: Diagnosis not present

## 2017-10-07 DIAGNOSIS — I10 Essential (primary) hypertension: Secondary | ICD-10-CM | POA: Diagnosis not present

## 2017-10-07 DIAGNOSIS — F329 Major depressive disorder, single episode, unspecified: Secondary | ICD-10-CM

## 2017-10-07 DIAGNOSIS — Z13220 Encounter for screening for lipoid disorders: Secondary | ICD-10-CM

## 2017-10-07 DIAGNOSIS — Z720 Tobacco use: Secondary | ICD-10-CM

## 2017-10-07 DIAGNOSIS — F901 Attention-deficit hyperactivity disorder, predominantly hyperactive type: Secondary | ICD-10-CM | POA: Diagnosis not present

## 2017-10-07 DIAGNOSIS — F32A Depression, unspecified: Secondary | ICD-10-CM

## 2017-10-07 DIAGNOSIS — R1032 Left lower quadrant pain: Secondary | ICD-10-CM

## 2017-10-07 DIAGNOSIS — F419 Anxiety disorder, unspecified: Secondary | ICD-10-CM | POA: Diagnosis not present

## 2017-10-07 DIAGNOSIS — Z23 Encounter for immunization: Secondary | ICD-10-CM

## 2017-10-07 DIAGNOSIS — K219 Gastro-esophageal reflux disease without esophagitis: Secondary | ICD-10-CM

## 2017-10-07 LAB — UA/M W/RFLX CULTURE, ROUTINE
Bilirubin, UA: NEGATIVE
Glucose, UA: NEGATIVE
KETONES UA: NEGATIVE
Leukocytes, UA: NEGATIVE
Nitrite, UA: NEGATIVE
PH UA: 5 (ref 5.0–7.5)
Protein, UA: NEGATIVE
RBC, UA: NEGATIVE
Specific Gravity, UA: 1.02 (ref 1.005–1.030)
UUROB: 0.2 mg/dL (ref 0.2–1.0)

## 2017-10-07 LAB — BAYER DCA HB A1C WAIVED: HB A1C (BAYER DCA - WAIVED): 4.7 % (ref <7.0)

## 2017-10-07 LAB — MICROALBUMIN, URINE WAIVED
Creatinine, Urine Waived: 200 mg/dL (ref 10–300)
Microalb, Ur Waived: 10 mg/L (ref 0–19)

## 2017-10-07 MED ORDER — OMEPRAZOLE 20 MG PO CPDR: 20.0000 mg | DELAYED_RELEASE_CAPSULE | Freq: Two times a day (BID) | ORAL | 1 refills | Status: DC

## 2017-10-07 MED ORDER — BUPROPION HCL ER (SR) 150 MG PO TB12: ORAL_TABLET | ORAL | 3 refills | Status: DC

## 2017-10-07 MED ORDER — CITALOPRAM HYDROBROMIDE 40 MG PO TABS: 60.0000 mg | ORAL_TABLET | Freq: Every day | ORAL | 1 refills | Status: DC

## 2017-10-07 MED ORDER — AMPHETAMINE-DEXTROAMPHET ER 30 MG PO CP24: 30.0000 mg | ORAL_CAPSULE | ORAL | 0 refills | Status: DC

## 2017-10-07 MED ORDER — BUSPIRONE HCL 5 MG PO TABS: 5.0000 mg | ORAL_TABLET | Freq: Three times a day (TID) | ORAL | 1 refills | Status: DC | PRN

## 2017-10-07 MED ORDER — AMPHETAMINE-DEXTROAMPHETAMINE 10 MG PO TABS: ORAL_TABLET | ORAL | 0 refills | Status: DC

## 2017-10-07 NOTE — Assessment & Plan Note (Signed)
Await results. Call with any concerns.

## 2017-10-07 NOTE — Assessment & Plan Note (Signed)
Not under great control, likely due to her mood- will add wellbutrin and recheck 1 month. Call with any concerns.  

## 2017-10-07 NOTE — Assessment & Plan Note (Signed)
Not under great control, likely due to her mood- will add wellbutrin and recheck 1 month. Call with any concerns.

## 2017-10-07 NOTE — Assessment & Plan Note (Signed)
Under good control on current regimen. Continue current regimen. Continue to monitor. Call with any concerns. Refills given today.   

## 2017-10-07 NOTE — Progress Notes (Addendum)
BP 113/67 (BP Location: Left Arm, Patient Position: Sitting, Cuff Size: Normal)   Pulse 78   Temp 98.2 F (36.8 C)   Wt 167 lb 2 oz (75.8 kg)   SpO2 99%   BMI 31.58 kg/m    Subjective:    Patient ID: Pamela Avila, female    DOB: 1971-08-01, 47 y.o.   MRN: [id number]  HPI: Pamela FlowKaren C Avila is a 47 y.o. female  No chief complaint on file.  ADHD FOLLOW UP- hasn't been working as well for about the last month.  ADHD status: worse Satisfied with current therapy: no Medication compliance:  excellent compliance Controlled substance contract: yes Previous psychiatry evaluation: no Previous medications: yes adderall, adderall XR and stratera (atomoxetine)   Taking meds on weekends/vacations: yes Work/school performance:  good Difficulty sustaining attention/completing tasks: yes Distracted by extraneous stimuli: yes Does not listen when spoken to: yes  Fidgets with hands or feet: no Unable to stay in seat: no Blurts out/interrupts others: no ADHD Medication Side Effects: no    Decreased appetite: no    Headache: no    Sleeping disturbance pattern: no    Irritability: no    Rebound effects (worse than baseline) off medication: no    Anxiousness: no    Dizziness: no    Tics: no  HYPERTENSION Hypertension status: controlled  Satisfied with current treatment? yes Duration of hypertension: chronic BP monitoring frequency:  not checking BP range:  BP medication side effects:  no Medication compliance: excellent compliance Previous BP meds: metoprolol Aspirin: no Recurrent headaches: no Visual changes: no Palpitations: no Dyspnea: no Chest pain: no Lower extremity edema: no Dizzy/lightheaded: no  HYPOTHYROIDISM Thyroid control status:controlled Satisfied with current treatment? yes Medication side effects: no Medication compliance: excellent compliance Recent dose adjustment:no Fatigue: yes Cold intolerance: no Heat intolerance: no Weight gain: no Weight loss:  no Constipation: no Diarrhea/loose stools: no Palpitations: no Lower extremity edema: no Anxiety/depressed mood: yes  DEPRESSION- having some issues at home, lots of stress, not feeling well.  Mood status: exacerbated Satisfied with current treatment?: no Symptom severity: moderate  Duration of current treatment : chronic Side effects: yes Medication compliance: excellent compliance Psychotherapy/counseling: no  Previous psychiatric medications: celexa and buspar Depressed mood: yes Anxious mood: yes Anhedonia: no Significant weight loss or gain: no Insomnia: no  Fatigue: yes Feelings of worthlessness or guilt: yes Impaired concentration/indecisiveness: no Suicidal ideations: no Hopelessness: no Crying spells: yes [facility] screen [facility] 10/07/2017 06/05/2017 06/05/2017 07/03/2016 02/10/2016  Decreased Interest 1 0 0 0 1  Down, Depressed, Hopeless 1 0 0 0 0  PHQ - 2 Score 2 0 0 0 1  Altered sleeping 1 1 - 3 -  Tired, decreased energy 1 1 - 2 -  Change in appetite 1 0 - 0 -  Feeling bad or failure about yourself  1 0 - 0 -  Trouble concentrating 3 3 - 2 -  Moving slowly or fidgety/restless 0 1 - 0 -  Suicidal thoughts 0 0 - 0 -  PHQ-9 Score 9 6 - 7 -  Difficult doing work/chores - Somewhat difficult - - -     Relevant past medical, surgical, family and social history reviewed and updated as indicated. Interim medical history since our last visit reviewed. Allergies and medications reviewed and updated.  Review of Systems  Constitutional: Negative.   Respiratory: Negative.   Cardiovascular: Negative.   Gastrointestinal: Positive for abdominal pain. Negative for abdominal distention, anal bleeding, blood in stool,  constipation, diarrhea, nausea, rectal pain and vomiting.  Musculoskeletal: Negative.   Neurological: Negative.   Psychiatric/Behavioral: Positive for decreased concentration and dysphoric mood. Negative for agitation, behavioral problems, confusion,  hallucinations, self-injury, sleep disturbance and suicidal ideas. The patient is nervous/anxious. The patient is not hyperactive.     Per HPI unless specifically indicated above     Objective:    BP 113/67 (BP Location: Left Arm, Patient Position: Sitting, Cuff Size: Normal)   Pulse 78   Temp 98.2 F (36.8 C)   Wt 167 lb 2 oz (75.8 kg)   SpO2 99%   BMI 31.58 kg/m   Wt Readings from Last 3 Encounters:  10/07/17 167 lb 2 oz (75.8 kg)  07/05/17 173 lb (78.5 kg)  06/05/17 170 lb 9 oz (77.4 kg)    Physical Exam  Constitutional: She is oriented to person, place, and time. She appears well-developed and well-nourished. No distress.  HENT:  Head: Normocephalic and atraumatic.  Right Ear: Hearing normal.  Left Ear: Hearing normal.  Nose: Nose normal.  Eyes: Conjunctivae and lids are normal. Right eye exhibits no discharge. Left eye exhibits no discharge. No scleral icterus.  Cardiovascular: Normal rate, regular rhythm, normal heart sounds and intact distal pulses. Exam reveals no gallop and no friction rub.  No murmur heard. Pulmonary/Chest: Effort normal and breath sounds normal. No respiratory distress. She has no wheezes. She has no rales. She exhibits no tenderness.  Abdominal: Soft. Bowel sounds are normal. She exhibits no distension and no mass. There is tenderness (LLQ pain). There is no rebound and no guarding.  Musculoskeletal: Normal range of motion.  Neurological: She is alert and oriented to person, place, and time.  Skin: Skin is warm, dry and intact. No rash noted. She is not diaphoretic. No erythema. No pallor.  Psychiatric: Her speech is normal and behavior is normal. Judgment and thought content normal. Her mood appears anxious. Cognition and memory are normal. She exhibits a depressed mood.  Nursing note and vitals reviewed.   Results for orders placed or performed during the hospital encounter of 12/28/16  Pregnancy, urine POC  Result Value Ref Range   Preg Test,  Ur NEGATIVE NEGATIVE  Surgical pathology  Result Value Ref Range   SURGICAL PATHOLOGY      Surgical Pathology CASE: [id number] PATIENT: [name] Surgical Pathology Report     SPECIMEN SUBMITTED: A. Duodenum bulb, R/O duodenitis; cbx B. Stomach, R/O H pylori; cbx C. Esophagus, R/O esophagitis; cbx D. Colon, random, R/O microscopic colitis; cbx  CLINICAL HISTORY: None provided  PRE-OPERATIVE DIAGNOSIS: Loose stool  POST-OPERATIVE DIAGNOSIS: Gastritis, esophagitis, hemorrhoids, hiatal hernia, duodenitis     DIAGNOSIS: A. DUODENUM BULB; COLD BIOPSY: - MILD CHRONIC NONSPECIFIC DUODENITIS. - NEGATIVE FOR VIRUS CYTOPATHIC EFFECT, DYSPLASIA, AND MALIGNANCY.  B. STOMACH; COLD BIOPSY: - ANTRAL AND OXYNTIC MUCOSA WITH MILD CHRONIC NONSPECIFIC GASTRITIS. - NEGATIVE FOR H. PYLORI, DYSPLASIA, AND MALIGNANCY.  C. ESOPHAGUS; COLD BIOPSY: - SQUAMOUS MUCOSA WITH RARE INTRAEPITHELIAL EOSINOPHILS (UP TO 3 INTRAEPITHELIAL EOSINOPHILS IN 20 HIGH-POWER FIELDS), SEE COMMENT. - NEGATIVE FOR DYSPLASIA AND MALIGNANCY.  D. COLON, RANDOM; COLD BIOPSY: - UNRE MARKABLE COLONIC MUCOSA.  Comment: The presence of rare intraepithelial eosinophils in the esophageal biopsy (specimen C) may be compatible with reflux esophagitis. Correlation with clinical impression and endoscopic findings is required.   GROSS DESCRIPTION:  A. Labeled: C BX duodenal bulb rule out duodenitis  Tissue fragment(s): 1  Size: 0.4 cm  Description: pink-tan fragment  Entirely submitted in 1 cassette(s).  B. Labeled: C BX gastric rule out H pylori  Tissue fragment(s): 4  Size: 0.2-0.5 cm  Description: pink-tan fragments  Entirely submitted in 1 cassette(s).  C. Labeled: C BX esophagus rule out esophagitis  Tissue fragment(s): 1  Size: 0.25 cm  Description: pink fragment  Entirely submitted in 1 cassette(s).  D. Labeled: C BX random colon rule out microscopic colitis  Tissue fragment(s):  multiple  Size: aggregate, 1.0 x 0.6 x 0.1 cm  Description: pink-tan fragments  Entirely submitted in one cassette(s).    Final Diagnosi s performed by Elijah Birk, MD.  Electronically signed 12/31/2016 16:10:96EA    The electronic signature indicates that the named Attending Pathologist has evaluated the specimen  Technical component performed at [facility], [address] [place], [place] [address] Lab: [phone] Dir: Titus Dubin. Cato Mulligan, MD  Professional component performed at [facility], [facility], [address] [place], [place], [place] [address] Lab: [phone] Dir: Georgiann Cocker. Oneita Kras, MD        Assessment & Plan:   Problem List Items Addressed This Visit      Cardiovascular and Mediastinum   Essential hypertension, benign - Primary    Under good control on current regimen. Continue current regimen. Continue to monitor. Call with any concerns. Refills given today.      Relevant Orders   CBC with Differential/Platelet   Comprehensive metabolic panel   Microalbumin, Urine Waived     Digestive   GERD (gastroesophageal reflux disease)    Stable on current regimen. Continue to monitor. Call with any concerns.       Relevant Medications   omeprazole (PRILOSEC) 20 MG capsule   Other Relevant Orders   Comprehensive metabolic panel     Endocrine   Hypothyroidism    Stable symptoms. Will recheck levels today and adjust dose as needed. Call with any concern.       Relevant Orders   TSH   CBC with Differential/Platelet   Comprehensive metabolic panel     Other   ADHD (attention deficit hyperactivity disorder)    Not under great control, likely due to her mood- will add wellbutrin and recheck 1 month. Call with any concerns.       Relevant Orders   CBC with Differential/Platelet   Comprehensive metabolic panel   Anxiety and depression    Not under great control, likely due to her mood- will add wellbutrin and recheck 1 month. Call with any  concerns.       Relevant Medications   buPROPion (WELLBUTRIN SR) 150 MG 12 hr tablet   citalopram (CELEXA) 40 MG tablet   busPIRone (BUSPAR) 5 MG tablet   Other Relevant Orders   CBC with Differential/Platelet   Comprehensive metabolic panel   Tobacco user    Checking labs today. Await results. She knows we're here if she wants to quit.      Relevant Orders   CBC with Differential/Platelet   Comprehensive metabolic panel   UA/M w/rflx Culture, Routine   Family history of diabetes mellitus    Await results. Call with any concerns.       Relevant Orders   Bayer DCA Hb A1c Waived    Other Visit Diagnoses    Immunization due       Flu shot given today   Relevant Orders   Flu Vaccine QUAD 6+ mos PF IM (Fluarix Quad PF) (Completed)   Screening for cholesterol level       Labs drawn today. Await results.  Relevant Orders   Lipid Panel w/o Chol/HDL Ratio   LLQ pain       No sign of hernia. Will obtain US to look for possible cause. Call with any concerns or if getting worse.    Relevant Orders   US Pelvis Limited   US Transvaginal Non-OB       Follow up plan: Return in about 4 weeks (around 11/04/2017) for Recheck ADD/Mood.

## 2017-10-07 NOTE — Assessment & Plan Note (Signed)
Stable symptoms. Will recheck levels today and adjust dose as needed. Call with any concern.

## 2017-10-07 NOTE — Assessment & Plan Note (Signed)
Checking labs today. Await results. She knows we're here if she wants to quit.

## 2017-10-07 NOTE — Assessment & Plan Note (Signed)
Stable on current regimen. Continue to monitor. Call with any concerns.  

## 2017-10-07 NOTE — Patient Instructions (Addendum)

## 2017-10-08 ENCOUNTER — Telehealth: Payer: Self-pay | Admitting: Family Medicine

## 2017-10-08 DIAGNOSIS — E039 Hypothyroidism, unspecified: Secondary | ICD-10-CM

## 2017-10-08 LAB — COMPREHENSIVE METABOLIC PANEL
A/G RATIO: 1.7 (ref 1.2–2.2)
ALT: 20 IU/L (ref 0–32)
AST: 15 IU/L (ref 0–40)
Albumin: 3.9 g/dL (ref 3.5–5.5)
Alkaline Phosphatase: 64 IU/L (ref 39–117)
BUN/Creatinine Ratio: 11 (ref 9–23)
BUN: 9 mg/dL (ref 6–24)
Bilirubin Total: 0.2 mg/dL (ref 0.0–1.2)
CALCIUM: 9 mg/dL (ref 8.7–10.2)
CO2: 21 mmol/L (ref 20–29)
CREATININE: 0.79 mg/dL (ref 0.57–1.00)
Chloride: 103 mmol/L (ref 96–106)
GFR, EST AFRICAN AMERICAN: 104 mL/min/{1.73_m2} (ref >59)
GFR, EST NON AFRICAN AMERICAN: 90 mL/min/{1.73_m2} (ref >59)
GLUCOSE: 84 mg/dL (ref 65–99)
Globulin, Total: 2.3 g/dL (ref 1.5–4.5)
Potassium: 4.3 mmol/L (ref 3.5–5.2)
Sodium: 138 mmol/L (ref 134–144)
TOTAL PROTEIN: 6.2 g/dL (ref 6.0–8.5)

## 2017-10-08 LAB — CBC WITH DIFFERENTIAL/PLATELET
BASOS: 0 %
Basophils Absolute: 0 10*3/uL (ref 0.0–0.2)
EOS (ABSOLUTE): 0.6 10*3/uL — AB (ref 0.0–0.4)
Eos: 8 %
Hematocrit: 42.3 % (ref 34.0–46.6)
Hemoglobin: 13.8 g/dL (ref 11.1–15.9)
IMMATURE GRANULOCYTES: 0 %
Immature Grans (Abs): 0 10*3/uL (ref 0.0–0.1)
Lymphocytes Absolute: 3.2 10*3/uL — ABNORMAL HIGH (ref 0.7–3.1)
Lymphs: 41 %
MCH: 30.5 pg (ref 26.6–33.0)
MCHC: 32.6 g/dL (ref 31.5–35.7)
MCV: 94 fL (ref 79–97)
Monocytes Absolute: 0.7 10*3/uL (ref 0.1–0.9)
Monocytes: 9 %
Neutrophils Absolute: 3.2 10*3/uL (ref 1.4–7.0)
Neutrophils: 42 %
PLATELETS: 296 10*3/uL (ref 150–379)
RBC: 4.52 x10E6/uL (ref 3.77–5.28)
RDW: 14.8 % (ref 12.3–15.4)
WBC: 7.7 10*3/uL (ref 3.4–10.8)

## 2017-10-08 LAB — LIPID PANEL W/O CHOL/HDL RATIO
Cholesterol, Total: 182 mg/dL (ref 100–199)
HDL: 57 mg/dL (ref >39)
LDL Calculated: 92 mg/dL (ref 0–99)
Triglycerides: 167 mg/dL — ABNORMAL HIGH (ref 0–149)
VLDL CHOLESTEROL CAL: 33 mg/dL (ref 5–40)

## 2017-10-08 LAB — TSH: TSH: 9.59 u[IU]/mL — ABNORMAL HIGH (ref 0.450–4.500)

## 2017-10-08 MED ORDER — LEVOTHYROXINE SODIUM 100 MCG PO TABS: 100.0000 ug | ORAL_TABLET | Freq: Every day | ORAL | 1 refills | Status: DC

## 2017-10-08 NOTE — Addendum Note (Signed)
Addended by: Dorcas CarrowJOHNSON, Matasha Smigelski P on: 10/08/2017 09:10 AM   Modules accepted: Orders

## 2017-10-08 NOTE — Telephone Encounter (Signed)
Pt returned call and her lab results given: Labs all look good, but her thyroid is not treated well enough. We will increase her medicine and recheck her levels in 6 weeks.  Pt understanding.

## 2017-10-08 NOTE — Telephone Encounter (Signed)
Called patient with her results. LMOM for her to all back. Labs all look good, but her thyroid is not treated well enough. We will increase her medicine and recheck her levels in 6 weeks.  OK to give her this message if she calls back.

## 2017-10-08 NOTE — Addendum Note (Signed)
Addended by: Sheilah MinsFOX, Benny Henrie A on: 10/08/2017 09:08 AM   Modules accepted: Orders

## 2017-10-14 ENCOUNTER — Ambulatory Visit: Payer: 59

## 2017-11-12 ENCOUNTER — Ambulatory Visit (INDEPENDENT_AMBULATORY_CARE_PROVIDER_SITE_OTHER): Payer: BLUE CROSS/BLUE SHIELD | Admitting: Family Medicine

## 2017-11-12 ENCOUNTER — Encounter: Payer: Self-pay | Admitting: Family Medicine

## 2017-11-12 VITALS — BP 94/62 | HR 81 | Temp 97.7°F | Ht 60.3 in | Wt 172.2 lb

## 2017-11-12 DIAGNOSIS — I10 Essential (primary) hypertension: Secondary | ICD-10-CM | POA: Diagnosis not present

## 2017-11-12 DIAGNOSIS — Z124 Encounter for screening for malignant neoplasm of cervix: Secondary | ICD-10-CM

## 2017-11-12 DIAGNOSIS — E039 Hypothyroidism, unspecified: Secondary | ICD-10-CM | POA: Diagnosis not present

## 2017-11-12 DIAGNOSIS — Z0000 Encounter for general adult medical examination without abnormal findings: Secondary | ICD-10-CM

## 2017-11-12 DIAGNOSIS — F901 Attention-deficit hyperactivity disorder, predominantly hyperactive type: Secondary | ICD-10-CM

## 2017-11-12 DIAGNOSIS — Z1239 Encounter for other screening for malignant neoplasm of breast: Secondary | ICD-10-CM

## 2017-11-12 DIAGNOSIS — F32A Depression, unspecified: Secondary | ICD-10-CM

## 2017-11-12 DIAGNOSIS — Z1231 Encounter for screening mammogram for malignant neoplasm of breast: Secondary | ICD-10-CM | POA: Diagnosis not present

## 2017-11-12 DIAGNOSIS — F329 Major depressive disorder, single episode, unspecified: Secondary | ICD-10-CM

## 2017-11-12 DIAGNOSIS — F419 Anxiety disorder, unspecified: Secondary | ICD-10-CM | POA: Diagnosis not present

## 2017-11-12 MED ORDER — AMPHETAMINE-DEXTROAMPHET ER 30 MG PO CP24: 30.0000 mg | ORAL_CAPSULE | ORAL | 0 refills | Status: DC

## 2017-11-12 MED ORDER — BUPROPION HCL ER (XL) 300 MG PO TB24: 300.0000 mg | ORAL_TABLET | Freq: Every day | ORAL | 1 refills | Status: DC

## 2017-11-12 MED ORDER — METOPROLOL SUCCINATE ER 25 MG PO TB24: 25.0000 mg | ORAL_TABLET | Freq: Every day | ORAL | 2 refills | Status: DC

## 2017-11-12 MED ORDER — AMPHETAMINE-DEXTROAMPHET ER 5 MG PO CP24: 5.0000 mg | ORAL_CAPSULE | Freq: Every day | ORAL | 0 refills | Status: DC

## 2017-11-12 NOTE — Progress Notes (Signed)
BP 94/62 (BP Location: Right Wrist, Patient Position: Sitting, Cuff Size: Normal)   Pulse 81   Temp 97.7 F (36.5 C) (Oral)   Ht 5' 0.3" (1.532 m)   Wt 172 lb 4 oz (78.1 kg)   SpO2 99%   BMI 33.31 kg/m    Subjective:    Patient ID: Pamela Avila, female    DOB: Dec 11, 1970, 47 y.o.   MRN: [id number]  HPI: Pamela Avila is a 47 y.o. female presenting on 11/12/2017 for comprehensive medical examination. Current medical complaints include:  ADHD FOLLOW UP ADHD status: uncontrolled Satisfied with current therapy: no Medication compliance:  excellent compliance Controlled substance contract: yes Previous psychiatry evaluation: no Previous medications: yes adderall, adderall XR and stratera (atomoxetine)   Taking meds on weekends/vacations: yes Work/school performance:  good Difficulty sustaining attention/completing tasks: yes Distracted by extraneous stimuli: yes Does not listen when spoken to: yes  Fidgets with hands or feet: no Unable to stay in seat: no Blurts out/interrupts others: no ADHD Medication Side Effects: no    Decreased appetite: no    Headache: no    Sleeping disturbance pattern: no    Irritability: no    Rebound effects (worse than baseline) off medication: no    Anxiousness: no    Dizziness: no    Tics: no  ANXIETY/DEPRESSION Duration:better Anxious mood: yes  Excessive worrying: no Irritability: yes  Sweating: no Nausea: no Palpitations:no Hyperventilation: no Panic attacks: no Agoraphobia: no  Obscessions/compulsions: no Depressed mood: yes Depression screen [facility] 2/9 11/12/2017 10/07/2017 06/05/2017 06/05/2017 07/03/2016  Decreased Interest 1 1 0 0 0  Down, Depressed, Hopeless 1 1 0 0 0  PHQ - 2 Score 2 2 0 0 0  Altered sleeping 1 1 1  - 3  Tired, decreased energy 1 1 1  - 2  Change in appetite 0 1 0 - 0  Feeling bad or failure about yourself  0 1 0 - 0  Trouble concentrating 2 3 3  - 2  Moving slowly or fidgety/restless 0 0 1 - 0  Suicidal  thoughts 0 0 0 - 0  PHQ-9 Score 6 9 6  - 7  Difficult doing work/chores - - Somewhat difficult - -   GAD 7 : Generalized Anxiety Score 11/12/2017 02/10/2016  Nervous, Anxious, on Edge 0 1  Control/stop worrying - 0  Worry too much - different things 0 0  Trouble relaxing 1 1  Restless 0 0  Easily annoyed or irritable 1 0  Afraid - awful might happen 0 0  Total GAD 7 Score - 2  Anxiety Difficulty Somewhat difficult Somewhat difficult   Anhedonia: no Weight changes: no Insomnia: no   Hypersomnia: yes Fatigue/loss of energy: yes Feelings of worthlessness: no Feelings of guilt: no Impaired concentration/indecisiveness: no Suicidal ideations: no  Crying spells: no Recent Stressors/Life Changes: no   Relationship problems: no   Family stress: no     Financial stress: no    Job stress: no    Recent death/loss: no  HYPOTHYROIDISM Thyroid control status:better- up until about 2 days ago, then getting worse again Satisfied with current treatment? no Medication side effects: no Medication compliance: excellent compliance Recent dose adjustment:yes Fatigue: yes Cold intolerance: no Heat intolerance: no Weight gain: no Weight loss: no Constipation: no Diarrhea/loose stools: no Palpitations: no Lower extremity edema: no Anxiety/depressed mood: yes  Menopausal Symptoms: no  Depression Screen done today and results listed below:  Depression screen [facility] 2/9 11/12/2017 10/07/2017 06/05/2017 06/05/2017 07/03/2016  Decreased  Interest 1 1 0 0 0  Down, Depressed, Hopeless 1 1 0 0 0  PHQ - 2 Score 2 2 0 0 0  Altered sleeping 1 1 1  - 3  Tired, decreased energy 1 1 1  - 2  Change in appetite 0 1 0 - 0  Feeling bad or failure about yourself  0 1 0 - 0  Trouble concentrating 2 3 3  - 2  Moving slowly or fidgety/restless 0 0 1 - 0  Suicidal thoughts 0 0 0 - 0  PHQ-9 Score 6 9 6  - 7  Difficult doing work/chores - - Somewhat difficult - -    Past Medical History:  Past Medical History:    Diagnosis Date  . ADHD (attention deficit hyperactivity disorder)   . Anemia   . Anxiety   . Anxiety disorder   . Depression   . Family history of diabetes mellitus   . GERD (gastroesophageal reflux disease)   . HSV (herpes simplex virus) infection   . Hypertension   . Hypothyroidism   . IBS (irritable bowel syndrome)   . Thyroid disease    hypothroidsim    Surgical History:  Past Surgical History:  Procedure Laterality Date  . BREAST SURGERY Right    lumpectomy  . CESAREAN SECTION     x 2  . COLONOSCOPY WITH PROPOFOL N/A 12/28/2016   Procedure: COLONOSCOPY WITH PROPOFOL;  Surgeon: Wyline Mood, MD;  Location: [facility] ENDOSCOPY;  Service: Endoscopy;  Laterality: N/A;  . CRYOTHERAPY  20 + years ago   cervix  . DILITATION & CURRETTAGE/HYSTROSCOPY WITH NOVASURE ABLATION N/A 04/11/2015   Procedure: DILATATION & CURETTAGE/HYSTEROSCOPY WITH NOVASURE ABLATION;  Surgeon: Herold Harms, MD;  Location: [facility] ORS;  Service: Gynecology;  Laterality: N/A;  . ESOPHAGOGASTRODUODENOSCOPY (EGD) WITH PROPOFOL N/A 12/28/2016   Procedure: ESOPHAGOGASTRODUODENOSCOPY (EGD) WITH PROPOFOL;  Surgeon: Wyline Mood, MD;  Location: [facility] ENDOSCOPY;  Service: Endoscopy;  Laterality: N/A;  . ESSURE TUBAL LIGATION    . lumpectomy Right   . TONSILLECTOMY    . TUBAL LIGATION      Medications:  Current Outpatient Medications on File Prior to Visit  Medication Sig  . amphetamine-dextroamphetamine (ADDERALL) 10 MG tablet Take 1/2 tab - 1 tab  in the afternoon as needed  . busPIRone (BUSPAR) 5 MG tablet Take 1-2 tablets (5-10 mg total) by mouth 3 (three) times daily as needed (anxiety).  . citalopram (CELEXA) 40 MG tablet Take 1.5 tablets (60 mg total) by mouth daily.  . cyclobenzaprine (FLEXERIL) 10 MG tablet Take 1 tablet (10 mg total) by mouth at bedtime.  [name] omeprazole (PRILOSEC) 20 MG capsule Take 1 capsule (20 mg total) by mouth 2 (two) times daily before a meal.  . Probiotic Product (PROBIOTIC PO) Take  by mouth daily.   No current facility-administered medications on file prior to visit.     Allergies:  Allergies  Allergen Reactions  . Erythromycin Itching    Social History:  Social History   Socioeconomic History  . Marital status: Married    Spouse name: Not on file  . Number of children: Not on file  . Years of education: Not on file  . Highest education level: Not on file  Occupational History  . Not on file  Social Needs  . Financial resource strain: Not on file  . Food insecurity:    Worry: Not on file    Inability: Not on file  . Transportation needs:    Medical: Not on file  Non-medical: Not on file  Tobacco Use  . Smoking status: Heavy Tobacco Smoker    Packs/day: 1.00    Types: Cigarettes  . Smokeless tobacco: Never Used  Substance and Sexual Activity  . Alcohol use: Yes    Comment: occasional  . Drug use: Yes    Types: Marijuana    Comment: Occasional  . Sexual activity: Yes  Lifestyle  . Physical activity:    Days per week: Not on file    Minutes per session: Not on file  . Stress: Not on file  Relationships  . Social connections:    Talks on phone: Not on file    Gets together: Not on file    Attends religious service: Not on file    Active member of club or organization: Not on file    Attends meetings of clubs or organizations: Not on file    Relationship status: Not on file  . Intimate partner violence:    Fear of current or ex partner: Not on file    Emotionally abused: Not on file    Physically abused: Not on file    Forced sexual activity: Not on file  Other Topics Concern  . Not on file  Social History Narrative  . Not on file   Social History   Tobacco Use  Smoking Status Heavy Tobacco Smoker  . Packs/day: 1.00  . Types: Cigarettes  Smokeless Tobacco Never Used   Social History   Substance and Sexual Activity  Alcohol Use Yes   Comment: occasional    Family History:  Family History  Problem Relation Age of  Onset  . Hypothyroidism Paternal Grandfather   . Breast cancer Paternal Grandmother   . Cancer Paternal Grandmother        breast  . Breast cancer Maternal Grandmother   . Hyperlipidemia Maternal Grandfather   . Diabetes Father   . Obesity Father   . Hypertension Father   . Hyperlipidemia Father   . Ovarian cancer Paternal Aunt        ?  [name]. Cancer Paternal Aunt        breast and cervical  . Breast cancer Paternal Aunt   . Hypothyroidism Mother   . Breast cancer Son   . Cancer Son        breast  . Colon cancer Neg Hx     Past medical history, surgical history, medications, allergies, family history and social history reviewed with patient today and changes made to appropriate areas of the chart.   Review of Systems  Constitutional: Negative.   HENT: Negative.   Eyes: Negative.   Respiratory: Negative.   Cardiovascular: Negative.   Gastrointestinal: Negative.   Genitourinary: Negative.   Musculoskeletal: Negative.   Skin: Negative.   Neurological: Negative.   Endo/Heme/Allergies: Negative.   Psychiatric/Behavioral: Positive for depression. Negative for hallucinations, memory loss, substance abuse and suicidal ideas. The patient is nervous/anxious. The patient does not have insomnia.    All other ROS negative except what is listed above and in the HPI.      Objective:    BP 94/62 (BP Location: Right Wrist, Patient Position: Sitting, Cuff Size: Normal)   Pulse 81   Temp 97.7 F (36.5 C) (Oral)   Ht 5' 0.3" (1.532 m)   Wt 172 lb 4 oz (78.1 kg)   SpO2 99%   BMI 33.31 kg/m   Wt Readings from Last 3 Encounters:  11/12/17 172 lb 4 oz (78.1 kg)  10/07/17 167 lb  2 oz (75.8 kg)  07/05/17 173 lb (78.5 kg)    Physical Exam  Constitutional: She is oriented to person, place, and time. She appears well-developed and well-nourished. No distress.  HENT:  Head: Normocephalic and atraumatic.  Right Ear: Hearing and external ear normal.  Left Ear: Hearing and external ear  normal.  Nose: Nose normal.  Mouth/Throat: Oropharynx is clear and moist. No oropharyngeal exudate.  Eyes: Pupils are equal, round, and reactive to light. Conjunctivae, EOM and lids are normal. Right eye exhibits no discharge. Left eye exhibits no discharge. No scleral icterus.  Neck: Normal range of motion. Neck supple. No JVD present. No tracheal deviation present. No thyromegaly present.  Cardiovascular: Normal rate, regular rhythm, normal heart sounds and intact distal pulses. Exam reveals no gallop and no friction rub.  No murmur heard. Pulmonary/Chest: Effort normal and breath sounds normal. No stridor. No respiratory distress. She has no wheezes. She has no rales. She exhibits no tenderness. Right breast exhibits no inverted nipple, no mass, no nipple discharge, no skin change and no tenderness. Left breast exhibits no inverted nipple, no mass, no nipple discharge, no skin change and no tenderness. Breasts are symmetrical.  Abdominal: Soft. Bowel sounds are normal. She exhibits no distension and no mass. There is no tenderness. There is no rebound and no guarding. Hernia confirmed negative in the right inguinal area and confirmed negative in the left inguinal area.  Genitourinary: Vagina normal and uterus normal. No labial fusion. There is no rash, tenderness, lesion or injury on the right labia. There is no rash, tenderness, lesion or injury on the left labia. Uterus is not deviated, not enlarged, not fixed and not tender. Cervix exhibits no motion tenderness, no discharge and no friability. Right adnexum displays no mass, no tenderness and no fullness. Left adnexum displays no mass, no tenderness and no fullness. No erythema, tenderness or bleeding in the vagina. No foreign body in the vagina. No signs of injury around the vagina. No vaginal discharge found.  Musculoskeletal: Normal range of motion. She exhibits no edema, tenderness or deformity.  Lymphadenopathy:    She has no cervical  adenopathy.  Neurological: She is alert and oriented to person, place, and time. She has normal reflexes. She displays normal reflexes. No cranial nerve deficit. She exhibits normal muscle tone. Coordination normal.  Skin: Skin is warm, dry and intact. No rash noted. She is not diaphoretic. No erythema. No pallor.  Psychiatric: She has a normal mood and affect. Her speech is normal and behavior is normal. Judgment and thought content normal. Cognition and memory are normal.  Nursing note and vitals reviewed.   Results for orders placed or performed in visit on 11/12/17  TSH  Result Value Ref Range   TSH 1.530 0.450 - 4.500 uIU/mL      Assessment & Plan:   Problem List Items Addressed This Visit      Cardiovascular and Mediastinum   Essential hypertension, benign    BP running low. Will decrease metoprolol to 25mg  and recheck 1 month.       Relevant Medications   metoprolol succinate (TOPROL-XL) 25 MG 24 hr tablet     Endocrine   Hypothyroidism    Rechecking levels today. Will adjust dose as needed. Cal with any concerns.       Relevant Medications   metoprolol succinate (TOPROL-XL) 25 MG 24 hr tablet     Other   ADHD (attention deficit hyperactivity disorder)    Still not quite there.  Will increase to 35mg  adderall XL daily. Has 10mg  PRN at home. Recheck 1 month. Call with any concerns.       Anxiety and depression    Will increase wellbutrin to 300mg  daily and recheck 1 month. Call with any concerns.       Relevant Medications   buPROPion (WELLBUTRIN XL) 300 MG 24 hr tablet    Other Visit Diagnoses    Routine general medical examination at a health care facility    -  Primary   Vaccines up to date. Screening labs checked last visit. Pap done today. Mammogram ordered. Continue diet and exercise. Call with any concerns.    Screening for cervical cancer       Pap done today.   Relevant Orders   IGP, Aptima HPV, rfx 16/18,45   Screening for breast cancer        Mammogram ordered today.   Relevant Orders   MM DIGITAL SCREENING BILATERAL       Follow up plan: Return in about 1 month (around 12/10/2017) for dollow up htn, add, mood.   LABORATORY TESTING:  - Pap smear: pap done  IMMUNIZATIONS:   - Tdap: Tetanus vaccination status reviewed: last tetanus booster within 10 years. - Influenza: Up to date - Pneumovax: will get next visit  SCREENING: -Mammogram: Ordered today   PATIENT COUNSELING:   Advised to take 1 mg of folate supplement per day if capable of pregnancy.   Sexuality: Discussed sexually transmitted diseases, partner selection, use of condoms, avoidance of unintended pregnancy  and contraceptive alternatives.   Advised to avoid cigarette smoking.  I discussed with the patient that most people either abstain from alcohol or drink within safe limits (<=14/week and <=4 drinks/occasion for males, <=7/weeks and <= 3 drinks/occasion for females) and that the risk for alcohol disorders and other health effects rises proportionally with the number of drinks per week and how often a drinker exceeds daily limits.  Discussed cessation/primary prevention of drug use and availability of treatment for abuse.   Diet: Encouraged to adjust caloric intake to maintain  or achieve ideal body weight, to reduce intake of dietary saturated fat and total fat, to limit sodium intake by avoiding high sodium foods and not adding table salt, and to maintain adequate dietary potassium and calcium preferably from fresh fruits, vegetables, and low-fat dairy products.    stressed the importance of regular exercise  Injury prevention: Discussed safety belts, safety helmets, smoke detector, smoking near bedding or upholstery.   Dental health: Discussed importance of regular tooth brushing, flossing, and dental visits.    NEXT PREVENTATIVE PHYSICAL DUE IN 1 YEAR. Return in about 1 month (around 12/10/2017) for dollow up htn, add, mood.

## 2017-11-13 ENCOUNTER — Telehealth: Payer: Self-pay | Admitting: Family Medicine

## 2017-11-13 ENCOUNTER — Encounter: Payer: Self-pay | Admitting: Family Medicine

## 2017-11-13 DIAGNOSIS — Z124 Encounter for screening for malignant neoplasm of cervix: Secondary | ICD-10-CM | POA: Diagnosis not present

## 2017-11-13 LAB — TSH: TSH: 1.53 u[IU]/mL (ref 0.450–4.500)

## 2017-11-13 MED ORDER — LEVOTHYROXINE SODIUM 100 MCG PO TABS: 100.0000 ug | ORAL_TABLET | Freq: Every day | ORAL | 3 refills | Status: DC

## 2017-11-13 NOTE — Assessment & Plan Note (Signed)
BP running low. Will decrease metoprolol to 25mg  and recheck 1 month.

## 2017-11-13 NOTE — Assessment & Plan Note (Signed)
Will increase wellbutrin to 300mg  daily and recheck 1 month. Call with any concerns.

## 2017-11-13 NOTE — Assessment & Plan Note (Signed)
Still not quite there. Will increase to 35mg  adderall XL daily. Has 10mg  PRN at home. Recheck 1 month. Call with any concerns.

## 2017-11-13 NOTE — Telephone Encounter (Signed)
Patient notified

## 2017-11-13 NOTE — Assessment & Plan Note (Signed)
Rechecking levels today. Will adjust dose as needed. Cal with any concerns.

## 2017-11-13 NOTE — Telephone Encounter (Signed)
Please let her know that her thyroid came back normal. Refills sent to her pharmacy.

## 2017-11-18 ENCOUNTER — Ambulatory Visit
Admission: RE | Admit: 2017-11-18 | Discharge: 2017-11-18 | Disposition: A | Discharge status: Home / Self Care | Payer: BLUE CROSS/BLUE SHIELD | Source: Ambulatory Visit | Attending: Family Medicine | Admitting: Family Medicine

## 2017-11-18 DIAGNOSIS — D259 Leiomyoma of uterus, unspecified: Secondary | ICD-10-CM | POA: Diagnosis not present

## 2017-11-18 DIAGNOSIS — R1032 Left lower quadrant pain: Secondary | ICD-10-CM | POA: Diagnosis present

## 2017-11-18 DIAGNOSIS — N83202 Unspecified ovarian cyst, left side: Secondary | ICD-10-CM | POA: Insufficient documentation

## 2017-11-18 LAB — IGP, APTIMA HPV, RFX 16/18,45
HPV APTIMA: NEGATIVE
PAP Smear Comment: 0

## 2017-11-19 ENCOUNTER — Encounter: Payer: Self-pay | Admitting: Family Medicine

## 2017-11-19 ENCOUNTER — Telehealth: Payer: Self-pay | Admitting: Family Medicine

## 2017-11-19 DIAGNOSIS — R9389 Abnormal findings on diagnostic imaging of other specified body structures: Secondary | ICD-10-CM

## 2017-11-19 NOTE — Telephone Encounter (Addendum)
Please let Pamela Avila know that her US looked good except for a line they saw in her uterus and didn't know what it was. They want her to have a pelvis x-ray. I've got the order for that in, and she can go whenever she'd like. Thanks!

## 2017-11-19 NOTE — Telephone Encounter (Signed)
Patient returned call, US results given per note Dr. Laural BenesJohnson on 11/19/17, patient verbalized understanding.

## 2017-11-19 NOTE — Telephone Encounter (Signed)
Called patient, no answer, left a message for patient to return my call.  Ok for nurse triage to give US results.

## 2017-11-20 ENCOUNTER — Encounter: Payer: Self-pay | Admitting: Family Medicine

## 2017-12-13 ENCOUNTER — Ambulatory Visit: Payer: BLUE CROSS/BLUE SHIELD | Admitting: Family Medicine

## 2017-12-16 ENCOUNTER — Encounter: Payer: Self-pay | Admitting: Family Medicine

## 2017-12-16 ENCOUNTER — Ambulatory Visit (INDEPENDENT_AMBULATORY_CARE_PROVIDER_SITE_OTHER): Payer: BLUE CROSS/BLUE SHIELD | Admitting: Family Medicine

## 2017-12-16 VITALS — BP 120/87 | HR 80 | Temp 98.3°F | Wt 168.3 lb

## 2017-12-16 DIAGNOSIS — F32A Depression, unspecified: Secondary | ICD-10-CM

## 2017-12-16 DIAGNOSIS — F419 Anxiety disorder, unspecified: Secondary | ICD-10-CM | POA: Diagnosis not present

## 2017-12-16 DIAGNOSIS — F901 Attention-deficit hyperactivity disorder, predominantly hyperactive type: Secondary | ICD-10-CM

## 2017-12-16 DIAGNOSIS — F329 Major depressive disorder, single episode, unspecified: Secondary | ICD-10-CM | POA: Diagnosis not present

## 2017-12-16 DIAGNOSIS — Z23 Encounter for immunization: Secondary | ICD-10-CM

## 2017-12-16 DIAGNOSIS — I10 Essential (primary) hypertension: Secondary | ICD-10-CM | POA: Diagnosis not present

## 2017-12-16 MED ORDER — AMPHETAMINE-DEXTROAMPHET ER 30 MG PO CP24: 30.0000 mg | ORAL_CAPSULE | ORAL | 0 refills | Status: DC

## 2017-12-16 MED ORDER — CITALOPRAM HYDROBROMIDE 40 MG PO TABS: 60.0000 mg | ORAL_TABLET | Freq: Every day | ORAL | 1 refills | Status: DC

## 2017-12-16 MED ORDER — BUSPIRONE HCL 5 MG PO TABS: 5.0000 mg | ORAL_TABLET | Freq: Three times a day (TID) | ORAL | 1 refills | Status: DC | PRN

## 2017-12-16 MED ORDER — GABAPENTIN 100 MG PO CAPS: ORAL_CAPSULE | ORAL | 3 refills | Status: DC

## 2017-12-16 MED ORDER — AMPHETAMINE-DEXTROAMPHET ER 5 MG PO CP24: 5.0000 mg | ORAL_CAPSULE | Freq: Every day | ORAL | 0 refills | Status: DC

## 2017-12-16 MED ORDER — METOPROLOL SUCCINATE ER 25 MG PO TB24: 25.0000 mg | ORAL_TABLET | Freq: Every day | ORAL | 2 refills | Status: DC

## 2017-12-16 MED ORDER — BUPROPION HCL ER (XL) 300 MG PO TB24: 300.0000 mg | ORAL_TABLET | Freq: Every day | ORAL | 1 refills | Status: DC

## 2017-12-16 MED ORDER — AMPHETAMINE-DEXTROAMPHET ER 5 MG PO CP24
5.0000 mg | ORAL_CAPSULE | Freq: Every day | ORAL | 0 refills | Status: DC
Start: 2017-12-16 — End: 2018-04-16

## 2017-12-16 NOTE — Assessment & Plan Note (Signed)
Under good control. Continue current regimen. Continue to monitor. Call with any concerns. Refills given today. 

## 2017-12-16 NOTE — Assessment & Plan Note (Signed)
Not under great control, likely due to home-life situation. Will leave everything alone. 3 month supply given today. Call with any concerns.

## 2017-12-16 NOTE — Assessment & Plan Note (Addendum)
Not under great control, likely due to home-life situation. Will leave everything alone. Call with any concerns.

## 2017-12-16 NOTE — Progress Notes (Signed)
BP 120/87 (BP Location: Left Arm, Patient Position: Sitting, Cuff Size: Normal)   Pulse 80   Temp 98.3 F (36.8 C)   Wt 168 lb 5 oz (76.3 kg)   SpO2 100%   BMI 32.55 kg/m    Subjective:    Patient ID: Pamela Avila, female    DOB: June 18, 1971, 47 y.o.   MRN: [id number]  HPI: Pamela Avila is a 47 y.o. female  Chief Complaint  Patient presents with  . Anxiety  . Hypertension  . ADHD   HYPERTENSION Hypertension status: controlled  Satisfied with current treatment? yes Duration of hypertension: chronic BP monitoring frequency:  not checking BP medication side effects:  no Medication compliance: excellent compliance Previous BP meds: metoprolol Aspirin: no Recurrent headaches: no Visual changes: no Palpitations: no Dyspnea: no Chest pain: no Lower extremity edema: no Dizzy/lightheaded: no  ADHD FOLLOW UP- having a lot of social issues at home. Not feeling like herself. Doesn't think anything we do is going to help right now. ADHD status: stable Satisfied with current therapy: yes Medication compliance:  excellent compliance Controlled substance contract: yes Previous psychiatry evaluation: no Previous medications: yes    Taking meds on weekends/vacations: yes Work/school performance:  good Difficulty sustaining attention/completing tasks: yes Distracted by extraneous stimuli: yes Does not listen when spoken to: yes  Fidgets with hands or feet: no Unable to stay in seat: no Blurts out/interrupts others: no ADHD Medication Side Effects: no    Decreased appetite: no    Headache: no    Sleeping disturbance pattern: no    Irritability: no    Rebound effects (worse than baseline) off medication: no    Anxiousness: no    Dizziness: no    Tics: no  ANXIETY/STRESS- asked her husband for a divorce, but he doesn't want one and she has not left. LOTS of stress at home. Not doing great at this time Duration:exacerbated Anxious mood: yes  Excessive worrying:  yes Irritability: yes  Sweating: no Nausea: no Palpitations:no Hyperventilation: no Panic attacks: no Agoraphobia: no  Obscessions/compulsions: no Depressed mood: yes Depression screen [facility] 2/9 12/16/2017 11/12/2017 10/07/2017 06/05/2017 06/05/2017  Decreased Interest 0 0  Down, Depressed, Hopeless 0 0  PHQ - 2 Score 0 0  Altered sleeping -  Tired, decreased energy -  Change in appetite 1 0 1 0 -  Feeling bad or failure about yourself  1 0 1 0 -  Trouble concentrating -  Moving slowly or fidgety/restless 0 0 0 1 -  Suicidal thoughts 0 0 0 0 -  PHQ-9 Score -  Difficult doing work/chores Very difficult - - Somewhat difficult -   Anhedonia: no Weight changes: no Insomnia: yes hard to fall asleep  Hypersomnia: no Fatigue/loss of energy: yes Feelings of worthlessness: yes Feelings of guilt: yes Impaired concentration/indecisiveness: yes Suicidal ideations: no  Crying spells: yes Recent Stressors/Life Changes: yes   Relationship problems: yes   Family stress: yes     Financial stress: yes    Job stress: yes    Recent death/loss: no   Relevant past medical, surgical, family and social history reviewed and updated as indicated. Interim medical history since our last visit reviewed. Allergies and medications reviewed and updated.  Review of Systems  Constitutional: Negative.   Respiratory: Negative.   Cardiovascular: Negative.   Skin: Negative.  Neurological: Negative.   Psychiatric/Behavioral: Positive for agitation, decreased concentration and dysphoric mood. Negative for behavioral problems, confusion, hallucinations, self-injury, sleep disturbance and suicidal ideas. The patient is nervous/anxious and is hyperactive.     Per HPI unless specifically indicated above     Objective:    BP 120/87 (BP Location: Left Arm, Patient Position: Sitting, Cuff Size: Normal)   Pulse 80   Temp 98.3 F (36.8 C)   Wt 168 lb 5 oz  (76.3 kg)   SpO2 100%   BMI 32.55 kg/m   Wt Readings from Last 3 Encounters:  12/16/17 168 lb 5 oz (76.3 kg)  11/12/17 172 lb 4 oz (78.1 kg)  10/07/17 167 lb 2 oz (75.8 kg)    Physical Exam  Constitutional: She is oriented to person, place, and time. She appears well-developed and well-nourished. No distress.  HENT:  Head: Normocephalic and atraumatic.  Right Ear: Hearing normal.  Left Ear: Hearing normal.  Nose: Nose normal.  Eyes: Conjunctivae and lids are normal. Right eye exhibits no discharge. Left eye exhibits no discharge. No scleral icterus.  Cardiovascular: Normal rate, regular rhythm, normal heart sounds and intact distal pulses. Exam reveals no gallop and no friction rub.  No murmur heard. Pulmonary/Chest: Effort normal and breath sounds normal. No stridor. No respiratory distress. She has no wheezes. She has no rales. She exhibits no tenderness.  Musculoskeletal: Normal range of motion.  Neurological: She is alert and oriented to person, place, and time.  Skin: Skin is warm, dry and intact. Capillary refill takes less than 2 seconds. No rash noted. She is not diaphoretic. No erythema. No pallor.  Psychiatric: Her speech is normal and behavior is normal. Judgment and thought content normal. Her mood appears anxious. Cognition and memory are normal. She exhibits a depressed mood.  Nursing note and vitals reviewed.   Results for orders placed or performed in visit on 11/12/17  TSH  Result Value Ref Range   TSH 1.530 0.450 - 4.500 uIU/mL  IGP, Aptima HPV, rfx 16/18,45  Result Value Ref Range   DIAGNOSIS: Comment    Specimen adequacy: Comment    Clinician Provided ICD10 Comment    Performed by: Comment    PAP Smear Comment .    Note: Comment    Test Methodology Comment    HPV Aptima Negative Negative      Assessment & Plan:   Problem List Items Addressed This Visit      Cardiovascular and Mediastinum   Essential hypertension, benign - Primary    Under good  control. Continue current regimen. Continue to monitor. Call with any concerns. Refills given today.      Relevant Medications   metoprolol succinate (TOPROL-XL) 25 MG 24 hr tablet     Other   ADHD (attention deficit hyperactivity disorder)    Not under great control, likely due to home-life situation. Will leave everything alone. 3 month supply given today. Call with any concerns.       Anxiety and depression    Not under great control, likely due to home-life situation. Will leave everything alone. Call with any concerns.       Relevant Medications   citalopram (CELEXA) 40 MG tablet   busPIRone (BUSPAR) 5 MG tablet   buPROPion (WELLBUTRIN XL) 300 MG 24 hr tablet       Follow up plan: Return in about 3 months (around 03/18/2018) for ADD/Anxiety follow up.

## 2017-12-23 ENCOUNTER — Other Ambulatory Visit: Payer: Self-pay | Admitting: Family Medicine

## 2017-12-23 ENCOUNTER — Ambulatory Visit: Admission: RE | Admit: 2017-12-23 | Payer: BLUE CROSS/BLUE SHIELD | Source: Ambulatory Visit | Admitting: *Deleted

## 2017-12-23 DIAGNOSIS — R935 Abnormal findings on diagnostic imaging of other abdominal regions, including retroperitoneum: Secondary | ICD-10-CM

## 2017-12-23 NOTE — Progress Notes (Signed)
DG hip unilat

## 2018-01-01 ENCOUNTER — Ambulatory Visit
Admission: RE | Admit: 2018-01-01 | Discharge: 2018-01-01 | Disposition: A | Discharge status: Home / Self Care | Payer: BLUE CROSS/BLUE SHIELD | Source: Ambulatory Visit | Attending: Family Medicine | Admitting: Family Medicine

## 2018-01-01 DIAGNOSIS — R9389 Abnormal findings on diagnostic imaging of other specified body structures: Secondary | ICD-10-CM | POA: Diagnosis present

## 2018-01-01 DIAGNOSIS — R935 Abnormal findings on diagnostic imaging of other abdominal regions, including retroperitoneum: Secondary | ICD-10-CM | POA: Diagnosis not present

## 2018-01-01 DIAGNOSIS — Z0389 Encounter for observation for other suspected diseases and conditions ruled out: Secondary | ICD-10-CM | POA: Diagnosis not present

## 2018-01-08 ENCOUNTER — Other Ambulatory Visit: Payer: Self-pay | Admitting: Family Medicine

## 2018-01-09 ENCOUNTER — Ambulatory Visit (INDEPENDENT_AMBULATORY_CARE_PROVIDER_SITE_OTHER): Payer: BLUE CROSS/BLUE SHIELD | Admitting: Obstetrics and Gynecology

## 2018-01-09 ENCOUNTER — Encounter: Payer: Self-pay | Admitting: Obstetrics and Gynecology

## 2018-01-09 VITALS — BP 99/65 | HR 93 | Ht 61.0 in | Wt 173.3 lb

## 2018-01-09 DIAGNOSIS — N912 Amenorrhea, unspecified: Secondary | ICD-10-CM

## 2018-01-09 DIAGNOSIS — Z9889 Other specified postprocedural states: Secondary | ICD-10-CM | POA: Insufficient documentation

## 2018-01-09 NOTE — Patient Instructions (Signed)
1.  Return as needed if vaginitis symptoms develop including malodor 2.  Left lower quadrant pain, nonacute, is of uncertain etiology.  The left Es- sure coil was removed at the time of hysteroscopy D&C with NovaSure ablation.  If pelvic pain worsens, return for evaluation.

## 2018-01-09 NOTE — Progress Notes (Signed)
Chief complaint: 1.  History of pelvic pain, left lower quadrant 2.  Absence of left Essure coil 3.  Amenorrhea, endometrial ablation induced 4.  Vaginal odor  GYN ENCOUNTER NOTE  Subjective:       Pamela Avila is a 47 y.o. [phone] female is here for gynecologic evaluation of the following issues:  1.  History of left lower quadrant pain, intermittent, nonacute 2.  Absence of left Essure coil.   3.  Amenorrhea 4.  Intermittent vaginal odor  Following recent exam a [facility], the patient underwent work-up for intermittent left lower quadrant pain.  The pain occurs may be several times a month and is short-lived.  She does not take any medication for this pain.  It does not compromise her activities of daily living. Pelvic ultrasound and x-ray subsequently demonstrated lack of presence of left Esssure coil.  She was referred here for further evaluation because of concern about the inability to find the left-sided coil.  The patient also has amenorrhea which is NovaSure endometrial ablation induced.  Review of op note from 2016 reveals that at the time of hysteroscopy/D&C and NovaSure endometrial ablation, the left uterine cornu Essure wire coil was removed.  Pamela Avila has normal bowel function.  She also has normal bladder function.  She is not experiencing any undesired dyspareunia.  Gynecologic History No LMP recorded. Patient has had an ablation.  Obstetric History OB History  Gravida Para Term Preterm AB Living  SAB TAB Ectopic Multiple Live Births        1 4    # Outcome Date GA Lbr Len/2nd Weight Sex Delivery Anes PTL Lv  3 Term 1999   6 lb 4.8 oz (2.858 kg) F CS-Unspec   LIV  2A Gravida 1997    F    ND  2B Preterm 1997   1 lb 6.4 oz (0.635 kg) F CS-Unspec   LIV  1 Term 1993   7 lb 4.8 oz (3.311 kg) M VBAC   LIV    Past Medical History:  Diagnosis Date  . ADHD (attention deficit hyperactivity disorder)   . Anemia   . Anxiety   . Anxiety  disorder   . Depression   . Family history of diabetes mellitus   . GERD (gastroesophageal reflux disease)   . HSV (herpes simplex virus) infection   . Hypertension   . Hypothyroidism   . IBS (irritable bowel syndrome)   . Thyroid disease    hypothroidsim    Past Surgical History:  Procedure Laterality Date  . BREAST SURGERY Right    lumpectomy  . CESAREAN SECTION     x 2  . COLONOSCOPY WITH PROPOFOL N/A 12/28/2016   Procedure: COLONOSCOPY WITH PROPOFOL;  Surgeon: Wyline Mood, MD;  Location: [facility] ENDOSCOPY;  Service: Endoscopy;  Laterality: N/A;  . CRYOTHERAPY  20 + years ago   cervix  . DILITATION & CURRETTAGE/HYSTROSCOPY WITH NOVASURE ABLATION N/A 04/11/2015   Procedure: DILATATION & CURETTAGE/HYSTEROSCOPY WITH NOVASURE ABLATION;  Surgeon: Herold Harms, MD;  Location: [facility] ORS;  Service: Gynecology;  Laterality: N/A;  . ESOPHAGOGASTRODUODENOSCOPY (EGD) WITH PROPOFOL N/A 12/28/2016   Procedure: ESOPHAGOGASTRODUODENOSCOPY (EGD) WITH PROPOFOL;  Surgeon: Wyline Mood, MD;  Location: [facility] ENDOSCOPY;  Service: Endoscopy;  Laterality: N/A;  . ESSURE TUBAL LIGATION    . lumpectomy Right   . TONSILLECTOMY    . TUBAL LIGATION      Current Outpatient Medications on File Prior  to Visit  Medication Sig Dispense Refill  . amphetamine-dextroamphetamine (ADDERALL XR) 30 MG 24 hr capsule Take 1 capsule (30 mg total) by mouth every morning. 30 capsule 0  . amphetamine-dextroamphetamine (ADDERALL XR) 5 MG 24 hr capsule Take 1 capsule (5 mg total) by mouth daily. 30 capsule 0  . amphetamine-dextroamphetamine (ADDERALL) 10 MG tablet Take 1/2 tab - 1 tab  in the afternoon as needed 180 tablet 0  . buPROPion (WELLBUTRIN XL) 300 MG 24 hr tablet Take 1 tablet (300 mg total) by mouth daily. 90 tablet 1  . busPIRone (BUSPAR) 5 MG tablet Take 1-2 tablets (5-10 mg total) by mouth 3 (three) times daily as needed (anxiety). 270 tablet 1  . citalopram (CELEXA) 40 MG tablet Take 1.5 tablets (60 mg total)  by mouth daily. 135 tablet 1  . cyclobenzaprine (FLEXERIL) 10 MG tablet Take 1 tablet (10 mg total) by mouth at bedtime. 30 tablet 0  . gabapentin (NEURONTIN) 100 MG capsule Take 1 cap qHS for 1 week, then increase to 2caps at QHS for 1 week, then 3 caps qHS after that 90 capsule 3  . levothyroxine (SYNTHROID, LEVOTHROID) 100 MCG tablet Take 1 tablet (100 mcg total) by mouth daily before breakfast. 90 tablet 3  . metoprolol succinate (TOPROL-XL) 25 MG 24 hr tablet Take 1 tablet (25 mg total) by mouth daily. 90 tablet 2  . omeprazole (PRILOSEC) 20 MG capsule Take 1 capsule (20 mg total) by mouth 2 (two) times daily before a meal. 180 capsule 1  . Probiotic Product (PROBIOTIC PO) Take by mouth daily.    . vitamin B-12 (CYANOCOBALAMIN) 500 MCG tablet Take 500 mcg by mouth daily.     No current facility-administered medications on file prior to visit.     Allergies  Allergen Reactions  . Erythromycin Itching    Social History   Socioeconomic History  . Marital status: Married    Spouse name: Not on file  . Number of children: Not on file  . Years of education: Not on file  . Highest education level: Not on file  Occupational History  . Not on file  Social Needs  . Financial resource strain: Not on file  . Food insecurity:    Worry: Not on file    Inability: Not on file  . Transportation needs:    Medical: Not on file    Non-medical: Not on file  Tobacco Use  . Smoking status: Heavy Tobacco Smoker    Packs/day: 1.00    Types: Cigarettes  . Smokeless tobacco: Never Used  Substance and Sexual Activity  . Alcohol use: Yes    Comment: occasional  . Drug use: Yes    Types: Marijuana    Comment: Occasional  . Sexual activity: Yes  Lifestyle  . Physical activity:    Days per week: Not on file    Minutes per session: Not on file  . Stress: Not on file  Relationships  . Social connections:    Talks on phone: Not on file    Gets together: Not on file    Attends religious  service: Not on file    Active member of club or organization: Not on file    Attends meetings of clubs or organizations: Not on file    Relationship status: Not on file  . Intimate partner violence:    Fear of current or ex partner: Not on file    Emotionally abused: Not on file    Physically abused:  Not on file    Forced sexual activity: Not on file  Other Topics Concern  . Not on file  Social History Narrative  . Not on file    Family History  Problem Relation Age of Onset  . Hypothyroidism Paternal Grandfather   . Breast cancer Paternal Grandmother   . Cancer Paternal Grandmother        breast  . Breast cancer Maternal Grandmother   . Hyperlipidemia Maternal Grandfather   . Diabetes Father   . Obesity Father   . Hypertension Father   . Hyperlipidemia Father   . Ovarian cancer Paternal Aunt        ?  [name] Cancer Paternal Aunt        breast and cervical  . Breast cancer Paternal Aunt   . Hypothyroidism Mother   . Breast cancer Son   . Cancer Son        breast  . Colon cancer Neg Hx     The following portions of the patient's history were reviewed and updated as appropriate: allergies, current medications, past family history, past medical history, past social history, past surgical history and problem list.  Review of Systems Review of Systems -comprehensive review of systems is negative except for that noted in the HPI  Objective:   BP 99/65   Pulse 93   Ht  (1.549 m)   Wt 173 lb 4.8 oz (78.6 kg)   BMI 32.74 kg/m  CONSTITUTIONAL: Well-developed, well-nourished female in no acute distress.  Pelvic exam is deferred    Assessment:   1. Amenorrhea, endometrial ablation induced  2. Status post endometrial ablation  3. History of D&C/hysteroscopy with removal of left Essure coil  4.  Intermittent vaginal odor, uncertain etiology, not present today     Plan:   1.  Findings from surgery at the time of hysteroscopy/D&C with NovaSure endometrial  ablation and removal of the left uterine cornu coiled wire was reviewed 2.  Patient is to return if she has a flare of pelvic pain so that we may further evaluate this condition when active 3.  Patient is to return as needed when she develops vaginal malodor, in order to rule out BV or other vaginitis  A total of 15 minutes were spent face-to-face with the patient during this encounter and over half of that time dealt with counseling and coordination of care.  Herold Harms, MD  Note: This dictation was prepared with Dragon dictation along with smaller phrase technology. Any transcriptional errors that result from this process are unintentional.

## 2018-01-15 ENCOUNTER — Encounter: Payer: Self-pay | Admitting: Family Medicine

## 2018-01-28 ENCOUNTER — Other Ambulatory Visit: Payer: Self-pay | Admitting: Family Medicine

## 2018-01-28 MED ORDER — AMPHETAMINE-DEXTROAMPHETAMINE 10 MG PO TABS: ORAL_TABLET | ORAL | 0 refills | Status: DC

## 2018-02-23 ENCOUNTER — Other Ambulatory Visit: Payer: Self-pay | Admitting: Family Medicine

## 2018-03-19 ENCOUNTER — Ambulatory Visit: Payer: BLUE CROSS/BLUE SHIELD | Admitting: Family Medicine

## 2018-03-21 ENCOUNTER — Ambulatory Visit: Payer: BLUE CROSS/BLUE SHIELD | Admitting: Family Medicine

## 2018-04-16 ENCOUNTER — Other Ambulatory Visit: Payer: Self-pay | Admitting: Family Medicine

## 2018-04-16 ENCOUNTER — Encounter: Payer: Self-pay | Admitting: Family Medicine

## 2018-04-16 MED ORDER — AMPHETAMINE-DEXTROAMPHETAMINE 10 MG PO TABS: ORAL_TABLET | ORAL | 0 refills | Status: DC

## 2018-04-16 MED ORDER — AMPHETAMINE-DEXTROAMPHET ER 30 MG PO CP24: 30.0000 mg | ORAL_CAPSULE | ORAL | 0 refills | Status: DC

## 2018-04-16 MED ORDER — AMPHETAMINE-DEXTROAMPHET ER 5 MG PO CP24: 5.0000 mg | ORAL_CAPSULE | Freq: Every day | ORAL | 0 refills | Status: DC

## 2018-04-25 ENCOUNTER — Other Ambulatory Visit: Payer: Self-pay | Admitting: Family Medicine

## 2018-04-25 ENCOUNTER — Ambulatory Visit (INDEPENDENT_AMBULATORY_CARE_PROVIDER_SITE_OTHER): Payer: BLUE CROSS/BLUE SHIELD | Admitting: Family Medicine

## 2018-04-25 ENCOUNTER — Other Ambulatory Visit: Payer: Self-pay

## 2018-04-25 ENCOUNTER — Encounter: Payer: Self-pay | Admitting: Family Medicine

## 2018-04-25 VITALS — BP 117/79 | HR 90 | Temp 98.2°F | Ht 61.0 in | Wt 175.6 lb

## 2018-04-25 DIAGNOSIS — F32A Depression, unspecified: Secondary | ICD-10-CM

## 2018-04-25 DIAGNOSIS — R5382 Chronic fatigue, unspecified: Secondary | ICD-10-CM | POA: Diagnosis not present

## 2018-04-25 DIAGNOSIS — Z23 Encounter for immunization: Secondary | ICD-10-CM | POA: Diagnosis not present

## 2018-04-25 DIAGNOSIS — D510 Vitamin B12 deficiency anemia due to intrinsic factor deficiency: Secondary | ICD-10-CM | POA: Diagnosis not present

## 2018-04-25 DIAGNOSIS — E038 Other specified hypothyroidism: Secondary | ICD-10-CM

## 2018-04-25 DIAGNOSIS — F419 Anxiety disorder, unspecified: Secondary | ICD-10-CM

## 2018-04-25 DIAGNOSIS — F901 Attention-deficit hyperactivity disorder, predominantly hyperactive type: Secondary | ICD-10-CM | POA: Diagnosis not present

## 2018-04-25 DIAGNOSIS — Z832 Family history of diseases of the blood and blood-forming organs and certain disorders involving the immune mechanism: Secondary | ICD-10-CM | POA: Diagnosis not present

## 2018-04-25 DIAGNOSIS — F329 Major depressive disorder, single episode, unspecified: Secondary | ICD-10-CM

## 2018-04-25 MED ORDER — AMPHETAMINE-DEXTROAMPHET ER 30 MG PO CP24: 30.0000 mg | ORAL_CAPSULE | ORAL | 0 refills | Status: DC

## 2018-04-25 MED ORDER — AMPHETAMINE-DEXTROAMPHET ER 5 MG PO CP24: 5.0000 mg | ORAL_CAPSULE | Freq: Every day | ORAL | 0 refills | Status: DC

## 2018-04-25 MED ORDER — AMPHETAMINE-DEXTROAMPHETAMINE 10 MG PO TABS: ORAL_TABLET | ORAL | 0 refills | Status: DC

## 2018-04-25 MED ORDER — AMPHETAMINE-DEXTROAMPHETAMINE 10 MG PO TABS: 5.0000 mg | ORAL_TABLET | Freq: Every day | ORAL | 0 refills | Status: DC | PRN

## 2018-04-25 MED ORDER — DICYCLOMINE HCL 10 MG PO CAPS: 10.0000 mg | ORAL_CAPSULE | Freq: Three times a day (TID) | ORAL | 1 refills | Status: DC

## 2018-04-25 MED ORDER — ATOMOXETINE HCL 40 MG PO CAPS: ORAL_CAPSULE | ORAL | 3 refills | Status: DC

## 2018-04-25 NOTE — Assessment & Plan Note (Addendum)
Doing OK, but not great. Would like to go back on her strattera. Rx sent to her pharmacy. Call with any concerns. 3 month supply given today. Follow up in 3 months.

## 2018-04-25 NOTE — Assessment & Plan Note (Signed)
Stable on current regimen, but would like to go back on straterra. Rx given. Continue current regimen. Call with any concerns.

## 2018-04-25 NOTE — Assessment & Plan Note (Signed)
Rechecking levels today. Await results. Will treat as needed.  

## 2018-04-25 NOTE — Progress Notes (Signed)
BP 117/79   Pulse 90   Temp 98.2 F (36.8 C) (Oral)   Ht 5\' 1"  (1.549 m)   Wt 175 lb 9.6 oz (79.7 kg)   SpO2 99%   BMI 33.18 kg/m    Subjective:    Patient ID: Pamela Avila, female    DOB: 1971-06-03, 47 y.o.   MRN: [id number]  HPI: Pamela FlowKaren C Avila is a 47 y.o. female  Chief Complaint  Patient presents with  . ADHD    [age]m f/u pt states she would like to discuss about wellbutrin  . Anxiety   Has been falling asleep at random times all the time- would like to be tested for narcolepsy  ANXIETY/STRESS Duration:controlled Anxious mood: yes  Excessive worrying: no Irritability: no  Sweating: no Nausea: no Palpitations:no Hyperventilation: no Panic attacks: no Agoraphobia: no  Obscessions/compulsions: no Depressed mood: no [facility] screen [facility] 04/25/2018 12/16/2017 11/12/2017 10/07/2017 06/05/2017  Decreased Interest 2 1 1 1  0  Down, Depressed, Hopeless 2 1 1 1  0  PHQ - 2 Score 4 2 2 2  0  Altered sleeping 2 2 1 1 1   Tired, decreased energy 2 2 1 1 1   Change in appetite 1 1 0 1 0  Feeling bad or failure about yourself  1 1 0 1 0  Trouble concentrating 1 1 2 3 3   Moving slowly or fidgety/restless 0 0 0 0 1  Suicidal thoughts 0 0 0 0 0  PHQ-9 Score 11 9 6 9 6   Difficult doing work/chores Somewhat difficult Very difficult - - Somewhat difficult   Anhedonia: no Weight changes: no Insomnia: no   Hypersomnia: no Fatigue/loss of energy: no Feelings of worthlessness: no Feelings of guilt: no Impaired concentration/indecisiveness: no Suicidal ideations: no  Crying spells: no Recent Stressors/Life Changes: no   Relationship problems: no   Family stress: no     Financial stress: no    Job stress: no    Recent death/loss: no  ADHD FOLLOW UP ADHD status: stable Satisfied with current therapy: yes Medication compliance:  excellent compliance Controlled substance contract: yes Previous psychiatry evaluation: yes Previous medications: yes    Taking meds on  weekends/vacations: yes Work/school performance:  good Difficulty sustaining attention/completing tasks: no Distracted by extraneous stimuli: no Does not listen when spoken to: no  Fidgets with hands or feet: no Unable to stay in seat: no Blurts out/interrupts others: no ADHD Medication Side Effects: no    Decreased appetite: no    Headache: no    Sleeping disturbance pattern: no    Irritability: no    Rebound effects (worse than baseline) off medication: no    Anxiousness: no    Dizziness: no    Tics: no  Relevant past medical, surgical, family and social history reviewed and updated as indicated. Interim medical history since our last visit reviewed. Allergies and medications reviewed and updated.  Review of Systems  Constitutional: Negative.   Respiratory: Negative.   Cardiovascular: Negative.   Psychiatric/Behavioral: Positive for decreased concentration and dysphoric mood. Negative for agitation, behavioral problems, confusion, hallucinations, self-injury, sleep disturbance and suicidal ideas. The patient is nervous/anxious. The patient is not hyperactive.     Per HPI unless specifically indicated above     Objective:    BP 117/79   Pulse 90   Temp 98.2 F (36.8 C) (Oral)   Ht 5\' 1"  (1.549 m)   Wt 175 lb 9.6 oz (79.7 kg)   SpO2 99%   BMI 33.18  kg/m   Wt Readings from Last 3 Encounters:  04/25/18 175 lb 9.6 oz (79.7 kg)  01/09/18 173 lb 4.8 oz (78.6 kg)  12/16/17 168 lb 5 oz (76.3 kg)    Physical Exam  Constitutional: She is oriented to person, place, and time. She appears well-developed and well-nourished. No distress.  HENT:  Head: Normocephalic and atraumatic.  Right Ear: Hearing normal.  Left Ear: Hearing normal.  Nose: Nose normal.  Eyes: Conjunctivae and lids are normal. Right eye exhibits no discharge. Left eye exhibits no discharge. No scleral icterus.  Cardiovascular: Normal rate, regular rhythm, normal heart sounds and intact distal pulses. Exam  reveals no gallop and no friction rub.  No murmur heard. Pulmonary/Chest: Effort normal and breath sounds normal. No stridor. No respiratory distress. She has no wheezes. She has no rales. She exhibits no tenderness.  Musculoskeletal: Normal range of motion.  Neurological: She is alert and oriented to person, place, and time.  Skin: Skin is warm, dry and intact. Capillary refill takes less than 2 seconds. No rash noted. She is not diaphoretic. No erythema. No pallor.  Psychiatric: She has a normal mood and affect. Her speech is normal and behavior is normal. Judgment and thought content normal. Cognition and memory are normal.  Nursing note and vitals reviewed.   Results for orders placed or performed in visit on 11/12/17  TSH  Result Value Ref Range   TSH 1.530 0.450 - 4.500 uIU/mL  IGP, Aptima HPV, rfx 16/18,45  Result Value Ref Range   DIAGNOSIS: Comment    Specimen adequacy: Comment    Clinician Provided ICD10 Comment    Performed by: Comment    PAP Smear Comment .    Note: Comment    Test Methodology Comment    HPV Aptima Negative Negative      Assessment & Plan:   Problem List Items Addressed This Visit      Endocrine   Hypothyroidism    Rechecking levels today. Await results. Will treat as needed.       Relevant Orders   Thyroid Panel With TSH     Other   ADHD (attention deficit hyperactivity disorder) - Primary    Doing OK, but not great. Would like to go back on her strattera. Rx sent to her pharmacy. Call with any concerns. 3 month supply given today. Follow up in 3 months.       Anxiety and depression    Stable on current regimen, but would like to go back on straterra. Rx given. Continue current regimen. Call with any concerns.        Other Visit Diagnoses    Flu vaccine need       Flu shot given today.   Relevant Orders   Flu Vaccine QUAD 36+ mos IM (Completed)   Family history of pernicious anemia       Checking labs today. Await results. Call with  any concerns.    Relevant Orders   CBC with Differential/Platelet   B12 and Folate Panel   Chronic fatigue       Concerned about narcolepsy. Would like to see sleep specialist. Referral generated today. Checking labs. Await results.    Relevant Orders   CBC with Differential/Platelet   B12 and Folate Panel   Ambulatory referral to Sleep Studies       Follow up plan: Return in about 3 months (around 07/25/2018) for Follow up ADD/mood/6 month follow up.

## 2018-04-26 LAB — B12 AND FOLATE PANEL: VITAMIN B 12: 955 pg/mL (ref 232–1245)

## 2018-04-26 LAB — CBC WITH DIFFERENTIAL/PLATELET
BASOS: 1 %
Basophils Absolute: 0.1 10*3/uL (ref 0.0–0.2)
EOS (ABSOLUTE): 0.4 10*3/uL (ref 0.0–0.4)
EOS: 6 %
HEMATOCRIT: 42.2 % (ref 34.0–46.6)
Hemoglobin: 14.6 g/dL (ref 11.1–15.9)
IMMATURE GRANS (ABS): 0 10*3/uL (ref 0.0–0.1)
IMMATURE GRANULOCYTES: 1 %
LYMPHS: 35 %
Lymphocytes Absolute: 2.2 10*3/uL (ref 0.7–3.1)
MCH: 31.5 pg (ref 26.6–33.0)
MCHC: 34.6 g/dL (ref 31.5–35.7)
MCV: 91 fL (ref 79–97)
Monocytes Absolute: 0.7 10*3/uL (ref 0.1–0.9)
Monocytes: 11 %
NEUTROS PCT: 46 %
Neutrophils Absolute: 3 10*3/uL (ref 1.4–7.0)
PLATELETS: 278 10*3/uL (ref 150–450)
RBC: 4.64 x10E6/uL (ref 3.77–5.28)
RDW: 13 % (ref 12.3–15.4)
WBC: 6.3 10*3/uL (ref 3.4–10.8)

## 2018-04-26 LAB — THYROID PANEL WITH TSH
Free Thyroxine Index: 2.5 (ref 1.2–4.9)
T3 Uptake Ratio: 26 % (ref 24–39)
T4, Total: 9.6 ug/dL (ref 4.5–12.0)
TSH: 2.13 u[IU]/mL (ref 0.450–4.500)

## 2018-05-12 ENCOUNTER — Other Ambulatory Visit: Payer: Self-pay | Admitting: Family Medicine

## 2018-05-12 MED ORDER — OMEPRAZOLE 20 MG PO CPDR: 20.0000 mg | DELAYED_RELEASE_CAPSULE | Freq: Two times a day (BID) | ORAL | 1 refills | Status: DC

## 2018-05-12 NOTE — Addendum Note (Signed)
Addended by: Dorcas Carrow on: 05/12/2018 08:59 AM   Modules accepted: Orders

## 2018-05-13 ENCOUNTER — Other Ambulatory Visit: Payer: Self-pay | Admitting: Family Medicine

## 2018-05-13 NOTE — Telephone Encounter (Signed)
Requested Prescriptions  Pending Prescriptions Disp Refills  . omeprazole (PRILOSEC) 20 MG capsule [Pharmacy Med Name: OMEPRAZOLE DR 20 MG CAPSULE] 180 capsule 1    Sig: TAKE 1 CAPSULE (20 MG TOTAL) BY MOUTH 2 (TWO) TIMES DAILY BEFORE A MEAL.     Gastroenterology: Proton Pump Inhibitors Passed - 05/13/2018 11:23 AM      Passed - Valid encounter within last 12 months    Recent Outpatient Visits          2 weeks ago Attention deficit hyperactivity disorder (ADHD), predominantly hyperactive type   [facility], Megan P, DO   4 months ago Essential hypertension, benign   [facility], Megan P, DO   6 months ago Routine general medical examination at a health care facility   [facility], [place] P, DO   7 months ago Essential hypertension, benign   [facility], Megan P, DO   10 months ago Attention deficit hyperactivity disorder (ADHD), predominantly hyperactive type   [facility] Dorcas Carrow, DO      Future Appointments            In 2 months Laural Benes, Oralia Rud, DO [facility]

## 2018-05-17 ENCOUNTER — Encounter: Payer: Self-pay | Admitting: Family Medicine

## 2018-06-06 ENCOUNTER — Inpatient Hospital Stay: Admission: RE | Admit: 2018-06-06 | Payer: BLUE CROSS/BLUE SHIELD | Source: Ambulatory Visit

## 2018-06-20 ENCOUNTER — Other Ambulatory Visit: Payer: Self-pay | Admitting: Family Medicine

## 2018-06-20 NOTE — Telephone Encounter (Signed)
Requested medication (s) are due for refill today: yes  Requested medication (s) are on the active medication list: yes  Last refill:  04/25/18  #60 3refills  Future visit scheduled: no  Notes to clinic:  Medication not delegated    Requested Prescriptions  Pending Prescriptions Disp Refills   atomoxetine (STRATTERA) 40 MG capsule [Pharmacy Med Name: ATOMOXETINE HCL 40 MG CAPSULE] 180 capsule 2    Sig: Start taking 1 pill a day for 1 week, then increase to 2 pills a day.     Not Delegated - Psychiatry: Norepinephrine Reuptake Inhibitor Failed - 06/20/2018 12:34 PM      Failed - This refill cannot be delegated      Passed - Valid encounter within last 6 months    Recent Outpatient Visits          1 month ago Attention deficit hyperactivity disorder (ADHD), predominantly hyperactive type   [facility], Megan P, DO   6 months ago Essential hypertension, benign   [facility] [place], Megan P, DO   7 months ago Routine general medical examination at a health care facility   [facility], [place] P, DO   8 months ago Essential hypertension, benign   [facility] [place], Megan P, DO   11 months ago Attention deficit hyperactivity disorder (ADHD), predominantly hyperactive type   [facility] Dorcas Carrow, DO      Future Appointments            In 1 month Johnson, Oralia Rud, DO [facility], [facility]

## 2018-06-24 ENCOUNTER — Ambulatory Visit (INDEPENDENT_AMBULATORY_CARE_PROVIDER_SITE_OTHER): Payer: BLUE CROSS/BLUE SHIELD | Admitting: Neurology

## 2018-06-24 ENCOUNTER — Encounter: Payer: Self-pay | Admitting: Neurology

## 2018-06-24 VITALS — BP 109/71 | HR 87 | Ht 61.0 in | Wt 180.0 lb

## 2018-06-24 DIAGNOSIS — Z72821 Inadequate sleep hygiene: Secondary | ICD-10-CM

## 2018-06-24 DIAGNOSIS — G4726 Circadian rhythm sleep disorder, shift work type: Secondary | ICD-10-CM | POA: Diagnosis not present

## 2018-06-24 DIAGNOSIS — R0683 Snoring: Secondary | ICD-10-CM

## 2018-06-24 DIAGNOSIS — G4719 Other hypersomnia: Secondary | ICD-10-CM | POA: Diagnosis not present

## 2018-06-24 DIAGNOSIS — F341 Dysthymic disorder: Secondary | ICD-10-CM

## 2018-06-24 NOTE — Patient Instructions (Signed)
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
## 2018-06-26 ENCOUNTER — Other Ambulatory Visit: Payer: Self-pay | Admitting: Family Medicine

## 2018-07-01 ENCOUNTER — Encounter: Payer: Self-pay | Admitting: Family Medicine

## 2018-07-01 LAB — NARCOLEPSY EVALUATION
HLA-DQ ALPHA: NEGATIVE
HLA-DQ BETA: NEGATIVE

## 2018-07-02 ENCOUNTER — Encounter: Payer: Self-pay | Admitting: Neurology

## 2018-07-19 ENCOUNTER — Other Ambulatory Visit: Payer: Self-pay | Admitting: Family Medicine

## 2018-07-21 NOTE — Telephone Encounter (Signed)
Requested medication (s) are due for refill today: yes  Requested medication (s) are on the active medication list: no- end date 04/25/18  Last refill:  12/16/17 historical med  Future visit scheduled: yes  Notes to clinic:  End date 04/25/18   Requested Prescriptions  Pending Prescriptions Disp Refills   gabapentin (NEURONTIN) 100 MG capsule [Pharmacy Med Name: GABAPENTIN 100 MG CAPSULE] 90 capsule 3    Sig: TAKE 1 CAPSULE BY MOUTH AT BEDTIME FOR 7 DAYS, 2 CAPS FOR 7 DAYS, THEN 3 CAPS AT BEDTIME THEREAFTER     Neurology: Anticonvulsants - gabapentin Passed - 07/19/2018  9:30 AM      Passed - Valid encounter within last 12 months    Recent Outpatient Visits          2 months ago Attention deficit hyperactivity disorder (ADHD), predominantly hyperactive type   [facility] Megan P, [facility]   7 months ago Essential hypertension, benign   [facility] [place], Megan P, DO   8 months ago Routine general medical examination at a health care facility   [facility] P, [facility]   9 months ago Essential hypertension, benign   [facility] [place]   1 year ago Attention deficit hyperactivity disorder (ADHD), predominantly hyperactive type   [facility] [place] [place], Megan P, DO      Future Appointments            In 2 days Laural BenesJohnson, Oralia RudMegan P, DO [facility]

## 2018-07-23 ENCOUNTER — Ambulatory Visit (INDEPENDENT_AMBULATORY_CARE_PROVIDER_SITE_OTHER): Payer: BLUE CROSS/BLUE SHIELD | Admitting: Family Medicine

## 2018-07-23 ENCOUNTER — Encounter: Payer: Self-pay | Admitting: Family Medicine

## 2018-07-23 ENCOUNTER — Ambulatory Visit (INDEPENDENT_AMBULATORY_CARE_PROVIDER_SITE_OTHER): Payer: BLUE CROSS/BLUE SHIELD | Admitting: Neurology

## 2018-07-23 VITALS — BP 134/88 | HR 106 | Temp 98.3°F | Ht 61.0 in | Wt 182.3 lb

## 2018-07-23 DIAGNOSIS — F341 Dysthymic disorder: Secondary | ICD-10-CM

## 2018-07-23 DIAGNOSIS — G4726 Circadian rhythm sleep disorder, shift work type: Secondary | ICD-10-CM

## 2018-07-23 DIAGNOSIS — G4733 Obstructive sleep apnea (adult) (pediatric): Secondary | ICD-10-CM

## 2018-07-23 DIAGNOSIS — F901 Attention-deficit hyperactivity disorder, predominantly hyperactive type: Secondary | ICD-10-CM

## 2018-07-23 DIAGNOSIS — Z72821 Inadequate sleep hygiene: Secondary | ICD-10-CM

## 2018-07-23 DIAGNOSIS — F329 Major depressive disorder, single episode, unspecified: Secondary | ICD-10-CM

## 2018-07-23 DIAGNOSIS — F419 Anxiety disorder, unspecified: Secondary | ICD-10-CM | POA: Diagnosis not present

## 2018-07-23 DIAGNOSIS — F32A Depression, unspecified: Secondary | ICD-10-CM

## 2018-07-23 DIAGNOSIS — R0683 Snoring: Secondary | ICD-10-CM

## 2018-07-23 DIAGNOSIS — G4719 Other hypersomnia: Secondary | ICD-10-CM

## 2018-07-23 MED ORDER — AMPHETAMINE-DEXTROAMPHET ER 5 MG PO CP24: 5.0000 mg | ORAL_CAPSULE | Freq: Every day | ORAL | 0 refills | Status: DC

## 2018-07-23 MED ORDER — ATOMOXETINE HCL 80 MG PO CAPS: 80.0000 mg | ORAL_CAPSULE | Freq: Every day | ORAL | 1 refills | Status: DC

## 2018-07-23 MED ORDER — AMPHETAMINE-DEXTROAMPHET ER 30 MG PO CP24: 30.0000 mg | ORAL_CAPSULE | ORAL | 0 refills | Status: DC

## 2018-07-23 MED ORDER — BUPROPION HCL ER (XL) 300 MG PO TB24: 300.0000 mg | ORAL_TABLET | Freq: Every day | ORAL | 1 refills | Status: DC

## 2018-07-23 MED ORDER — BUSPIRONE HCL 5 MG PO TABS: 5.0000 mg | ORAL_TABLET | Freq: Three times a day (TID) | ORAL | 1 refills | Status: DC | PRN

## 2018-07-23 MED ORDER — AMPHETAMINE-DEXTROAMPHETAMINE 10 MG PO TABS: ORAL_TABLET | ORAL | 0 refills | Status: DC

## 2018-07-23 MED ORDER — CITALOPRAM HYDROBROMIDE 40 MG PO TABS: 60.0000 mg | ORAL_TABLET | Freq: Every day | ORAL | 1 refills | Status: DC

## 2018-07-23 NOTE — Assessment & Plan Note (Addendum)
Under good control on current regimen. Continue current regimen. Continue to monitor. Call with any concerns. Refills given for 3 months.   

## 2018-07-23 NOTE — Assessment & Plan Note (Signed)
Under good control on current regimen. Continue current regimen. Continue to monitor. Call with any concerns. Refills given.   

## 2018-07-23 NOTE — Progress Notes (Signed)
BP 134/88 (BP Location: Left Arm, Patient Position: Sitting, Cuff Size: Normal)   Pulse (!) 106   Temp 98.3 F (36.8 C)   Ht '5\' 1"'  (1.549 m)   Wt 182 lb 5 oz (82.7 kg)   SpO2 100%   BMI 34.45 kg/m    Subjective:    Patient ID: Pamela Avila, female    DOB: 26-Oct-1970, 47 y.o.   MRN: [id number]  HPI: Pamela Avila is a 47 y.o. female  Chief Complaint  Patient presents with  . ADD   ADHD FOLLOW UP ADHD status: controlled Satisfied with current therapy: yes Medication compliance:  excellent compliance Controlled substance contract: yes Previous psychiatry evaluation: no Previous medications: yes    Taking meds on weekends/vacations: yes Work/school performance:  good Difficulty sustaining attention/completing tasks: no Distracted by extraneous stimuli: no Does not listen when spoken to: no  Fidgets with hands or feet: no Unable to stay in seat: no Blurts out/interrupts others: no ADHD Medication Side Effects: no    Decreased appetite: no    Headache: no    Sleeping disturbance pattern: no    Irritability: no    Rebound effects (worse than baseline) off medication: no    Anxiousness: no    Dizziness: no    Tics: no  DEPRESSION Mood status: controlled Satisfied with current treatment?: yes Symptom severity: mild  Duration of current treatment : chronic Side effects: no Medication compliance: excellent compliance Psychotherapy/counseling: no  Previous psychiatric medications: buspar, wellbutrin, celexa Depressed mood: no Anxious mood: no Anhedonia: no Significant weight loss or gain: no Insomnia: no  Fatigue: no Feelings of worthlessness or guilt: no Impaired concentration/indecisiveness: no Suicidal ideations: no Hopelessness: no Crying spells: no Depression screen [facility] 2/9 04/25/2018 12/16/2017 11/12/2017 10/07/2017 06/05/2017  Decreased Interest '2 1 1 1 ' 0  Down, Depressed, Hopeless '2 1 1 1 ' 0  PHQ - 2 Score '4 2 2 2 ' 0  Altered sleeping '2 2 1 1 1  ' Tired,  decreased energy '2 2 1 1 1  ' Change in appetite 1 1 0 1 0  Feeling bad or failure about yourself  1 1 0 1 0  Trouble concentrating '1 1 2 3 3  ' Moving slowly or fidgety/restless 0 0 0 0 1  Suicidal thoughts 0 0 0 0 0  PHQ-9 Score '11 9 6 9 6  ' Difficult doing work/chores Somewhat difficult Very difficult - - Somewhat difficult   Relevant past medical, surgical, family and social history reviewed and updated as indicated. Interim medical history since our last visit reviewed. Allergies and medications reviewed and updated.  Review of Systems  Constitutional: Negative.   Respiratory: Negative.   Cardiovascular: Negative.   Psychiatric/Behavioral: Negative.     Per HPI unless specifically indicated above     Objective:    BP 134/88 (BP Location: Left Arm, Patient Position: Sitting, Cuff Size: Normal)   Pulse (!) 106   Temp 98.3 F (36.8 C)   Ht '5\' 1"'  (1.549 m)   Wt 182 lb 5 oz (82.7 kg)   SpO2 100%   BMI 34.45 kg/m   Wt Readings from Last 3 Encounters:  07/23/18 182 lb 5 oz (82.7 kg)  06/24/18 180 lb (81.6 kg)  04/25/18 175 lb 9.6 oz (79.7 kg)    Physical Exam  Constitutional: She is oriented to person, place, and time. She appears well-developed and well-nourished. No distress.  HENT:  Head: Normocephalic and atraumatic.  Right Ear: Hearing normal.  Left Ear: Hearing  normal.  Nose: Nose normal.  Eyes: Conjunctivae and lids are normal. Right eye exhibits no discharge. Left eye exhibits no discharge. No scleral icterus.  Cardiovascular: Normal rate, regular rhythm, normal heart sounds and intact distal pulses. Exam reveals no gallop and no friction rub.  No murmur heard. Pulmonary/Chest: Effort normal and breath sounds normal. No stridor. No respiratory distress. She has no wheezes. She has no rales. She exhibits no tenderness.  Musculoskeletal: Normal range of motion.  Neurological: She is alert and oriented to person, place, and time.  Skin: Skin is warm, dry and intact.  Capillary refill takes less than 2 seconds. No rash noted. She is not diaphoretic. No erythema. No pallor.  Psychiatric: She has a normal mood and affect. Her speech is normal and behavior is normal. Judgment and thought content normal. Cognition and memory are normal.  Nursing note and vitals reviewed.   Results for orders placed or performed in visit on 06/24/18  NARCOLEPSY EVALUATION  Result Value Ref Range   HLA-DQ ALPHA Negative    HLA-DQ BETA Negative    COMMENT: Comment    Please Note Comment       Assessment & Plan:   Problem List Items Addressed This Visit      Other   ADHD (attention deficit hyperactivity disorder) - Primary    Under good control on current regimen. Continue current regimen. Continue to monitor. Call with any concerns. Refills given for 3 months.        Anxiety and depression    Under good control on current regimen. Continue current regimen. Continue to monitor. Call with any concerns. Refills given.       Relevant Medications   buPROPion (WELLBUTRIN XL) 300 MG 24 hr tablet   busPIRone (BUSPAR) 5 MG tablet   citalopram (CELEXA) 40 MG tablet       Follow up plan: Return in about 3 months (around 10/22/2018) for follow up ADD.

## 2018-07-26 ENCOUNTER — Encounter: Payer: Self-pay | Admitting: Family Medicine

## 2018-07-26 ENCOUNTER — Telehealth: Payer: BLUE CROSS/BLUE SHIELD | Admitting: Nurse Practitioner

## 2018-07-26 DIAGNOSIS — J00 Acute nasopharyngitis [common cold]: Secondary | ICD-10-CM

## 2018-07-26 DIAGNOSIS — G4719 Other hypersomnia: Secondary | ICD-10-CM | POA: Insufficient documentation

## 2018-07-26 DIAGNOSIS — F341 Dysthymic disorder: Secondary | ICD-10-CM | POA: Insufficient documentation

## 2018-07-26 DIAGNOSIS — Z72821 Inadequate sleep hygiene: Secondary | ICD-10-CM | POA: Insufficient documentation

## 2018-07-26 DIAGNOSIS — G4726 Circadian rhythm sleep disorder, shift work type: Secondary | ICD-10-CM | POA: Insufficient documentation

## 2018-07-26 DIAGNOSIS — R0683 Snoring: Secondary | ICD-10-CM | POA: Insufficient documentation

## 2018-07-26 DIAGNOSIS — R42 Dizziness and giddiness: Secondary | ICD-10-CM

## 2018-07-26 DIAGNOSIS — G4733 Obstructive sleep apnea (adult) (pediatric): Secondary | ICD-10-CM | POA: Insufficient documentation

## 2018-07-26 MED ORDER — MECLIZINE HCL 25 MG PO TABS: 25.0000 mg | ORAL_TABLET | Freq: Three times a day (TID) | ORAL | 0 refills | Status: DC | PRN
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
## 2018-07-26 NOTE — Progress Notes (Signed)
We are sorry that you are not feeling well.  Here is how we plan to help!  Based on what you have shared with me it looks like you have sinusitis.  Sinusitis is inflammation and infection in the sinus cavities of the head.  Based on your presentation I believe you most likely have Acute Viral Sinusitis.This is an infection most likely caused by a virus. There is not specific treatment for viral sinusitis other than to help you with the symptoms until the infection runs its course.  You may use an oral decongestant such as Mucinex D or if you have glaucoma or high blood pressure use plain Mucinex. Saline nasal spray help and can safely be used as often as needed for congestion, I have prescribed:  I have prescribed:antivert 25mg  1 po 3x a day as needed for the dizziness/vertigo you are experiencing.  Some authorities believe that zinc sprays or the use of Echinacea may shorten the course of your symptoms.  Sinus infections are not as easily transmitted as other respiratory infection, however we still recommend that you avoid close contact with loved ones, especially the very young and elderly.  Remember to wash your hands thoroughly throughout the day as this is the number one way to prevent the spread of infection!  Home Care:  Only take medications as instructed by your medical team.  Do not take these medications with alcohol.  A steam or ultrasonic humidifier can help congestion.  You can place a towel over your head and breathe in the steam from hot water coming from a faucet.  Avoid close contacts especially the very young and the elderly.  Cover your mouth when you cough or sneeze.  Always remember to wash your hands.  Get Help Right Away If:  You develop worsening fever or sinus pain.  You develop a severe head ache or visual changes.  Your symptoms persist after you have completed your treatment plan.  Make sure you  Understand these instructions.  Will watch your  condition.  Will get help right away if you are not doing well or get worse.  Your e-visit answers were reviewed by a board certified advanced clinical practitioner to complete your personal care plan.  Depending on the condition, your plan could have included both over the counter or prescription medications.  If there is a problem please reply  once you have received a response from your provider.  Your safety is important to us.  If you have drug allergies check your prescription carefully.    You can use MyChart to ask questions about today's visit, request a non-urgent call back, or ask for a work or school excuse for 24 hours related to this e-Visit. If it has been greater than 24 hours you will need to follow up with your provider, or enter a new e-Visit to address those concerns.  You will get an e-mail in the next two days asking about your experience.  I hope that your e-visit has been valuable and will speed your recovery. Thank you for using e-visits.

## 2018-07-26 NOTE — Addendum Note (Signed)
Addended by: Melvyn NovasHMEIER, Craig Wisnewski on: 07/26/2018 02:02 PM   Modules accepted: Orders

## 2018-07-28 ENCOUNTER — Telehealth: Payer: Self-pay

## 2018-07-28 MED ORDER — PREDNISONE 50 MG PO TABS: 50.0000 mg | ORAL_TABLET | Freq: Every day | ORAL | 0 refills | Status: DC

## 2018-07-28 NOTE — Telephone Encounter (Signed)
Called patient, no answer. Left detailed message in her mobil phone (DPR reviewed) and asked pt to give us a call back to the office if she has any questions.

## 2018-07-30 ENCOUNTER — Telehealth: Payer: Self-pay | Admitting: Neurology

## 2018-07-30 ENCOUNTER — Encounter: Payer: Self-pay | Admitting: Neurology

## 2018-07-30 NOTE — Telephone Encounter (Signed)
-----   Message from Melvyn Novasarmen Dohmeier, MD sent at 07/26/2018  2:02 PM EST ----- IMPRESSION: Mild Obstructive Sleep Apnea overall, with REM sleep  accentuation, and without oxygen desaturation of clinical  significance. The higher Epworth score urges treatment of mild  apnea.  RECOMMENDATION: The REM sleep accentuation makes CPAP the best  treatment option, and of course weight loss is encouraged as  well. Auto CPAP of 4-12 cm water, 1 cm EPR, mask of choice and  heated humidification are ordered. The patient needs to work on  her sleep hygiene and routines. Insomnia likely non organic but  due to poor habits, and peri-menopausal symptoms.

## 2018-07-30 NOTE — Telephone Encounter (Signed)
Called patient to discuss sleep study results. No answer at this time. LVM for the patient to call back.   

## 2018-08-01 ENCOUNTER — Encounter: Payer: Self-pay | Admitting: *Deleted

## 2018-08-01 NOTE — Telephone Encounter (Signed)
Pt has returned the call to RN Casey, she is asking for a call back °

## 2018-08-01 NOTE — Telephone Encounter (Signed)
Spoke with patient and reviewed her sleep study results. Advised her that Dr. Vickey Hugerohmeier found that patient has mild obstructive sleep apnea, worsened in REM sleep. Dr. Vickey Hugerohmeier advises to start CPAP at home. Patient agreed to this plan. Also encouraged pt regarding weight loss and we reviewed healthy sleep habits and bedtime routines and what this can look like. Patient advised that we will send a message to [facility] to get patient started on CPAP and they should contact her in roughly a week, will setup a time to come to her home and go over machine and show her how to take care of it. Pt educated to try to use the machine every night at least 4 hours (goal all night) and advised pt that insurance requires this and a follow up appointment. I made the follow up appointment for October 16, 2018 @ 09:45 arrival 09:15-09:30 and advised pt to bring her whole machine to this first appointment. Pt verbalized understanding and appreciation. I advised her that a letter would be sent to her home with this information. Address confirmed.

## 2018-08-04 ENCOUNTER — Encounter: Payer: Self-pay | Admitting: *Deleted

## 2018-08-04 NOTE — Telephone Encounter (Signed)
CPAP letter mailed to pt today.

## 2018-08-12 DIAGNOSIS — G4733 Obstructive sleep apnea (adult) (pediatric): Secondary | ICD-10-CM | POA: Diagnosis not present

## 2018-08-15 ENCOUNTER — Ambulatory Visit
Admission: RE | Admit: 2018-08-15 | Discharge: 2018-08-15 | Disposition: A | Discharge status: Home / Self Care | Payer: BLUE CROSS/BLUE SHIELD | Source: Ambulatory Visit | Attending: Family Medicine | Admitting: Family Medicine

## 2018-08-15 DIAGNOSIS — Z1239 Encounter for other screening for malignant neoplasm of breast: Secondary | ICD-10-CM | POA: Insufficient documentation

## 2018-08-15 DIAGNOSIS — Z1231 Encounter for screening mammogram for malignant neoplasm of breast: Secondary | ICD-10-CM | POA: Diagnosis not present

## 2018-08-25 ENCOUNTER — Encounter: Payer: Self-pay | Admitting: Family Medicine

## 2018-08-25 DIAGNOSIS — H938X2 Other specified disorders of left ear: Secondary | ICD-10-CM

## 2018-08-25 DIAGNOSIS — J342 Deviated nasal septum: Secondary | ICD-10-CM

## 2018-08-26 ENCOUNTER — Telehealth: Payer: Self-pay | Admitting: Family Medicine

## 2018-08-26 ENCOUNTER — Telehealth: Payer: Self-pay

## 2018-08-26 NOTE — Telephone Encounter (Signed)
PA's for both Adderall 30 mg and Adderall 5 mg submitted via Cover My Meds. Key for 30 mg: [id number] and Key for 5 mg: [id number]

## 2018-08-26 NOTE — Telephone Encounter (Signed)
Copied from CRM [id number]. Topic: Quick Communication - Rx Refill/Question >> Aug 26, 2018  5:05 PM Pamela Avila wrote: Medication: amphetamine-dextroamphetamine (ADDERALL XR) 30 MG 24 hr capsule  amphetamine-dextroamphetamine (ADDERALL XR) 5 MG 24 hr capsule amphetamine-dextroamphetamine (ADDERALL) 10 MG tablet   Has the patient contacted their pharmacy? Yes (Prior Authorization is needed for the above medications)    Preferred Pharmacy (with phone number or street name): [id number] - [place], [place] - [address] [phone] (Phone) [phone] (Fax)    Agent: Please be advised that RX refills may take up to 3 business days. We ask that you follow-up with your pharmacy.

## 2018-08-27 NOTE — Telephone Encounter (Signed)
PA's have already been submitted, awaiting determination.

## 2018-08-27 NOTE — Telephone Encounter (Signed)
PA is still in process 

## 2018-08-27 NOTE — Telephone Encounter (Signed)
Postdated Rxs sent through to her pharmacy in December- please check with the pharmacy.

## 2018-08-29 NOTE — Telephone Encounter (Signed)
Do we have the papers?

## 2018-08-29 NOTE — Telephone Encounter (Signed)
PA's were denied.

## 2018-08-29 NOTE — Telephone Encounter (Signed)
They are in your FYI folder.

## 2018-09-02 ENCOUNTER — Telehealth: Payer: Self-pay

## 2018-09-02 NOTE — Telephone Encounter (Signed)
Called patient to ask about ADHD medications. No answer, left a message for patient to return my call. OK for nurse triage to discuss the following with the patient.  Has the patient ever tried anything other than adderall, if so what has she tried and when. Has the pharmacy been dispensing the brand or generic adderall when she has been having it filled?  CRM Created

## 2018-09-04 ENCOUNTER — Encounter: Payer: Self-pay | Admitting: Family Medicine

## 2018-09-04 DIAGNOSIS — M792 Neuralgia and neuritis, unspecified: Secondary | ICD-10-CM | POA: Diagnosis not present

## 2018-09-04 DIAGNOSIS — R221 Localized swelling, mass and lump, neck: Secondary | ICD-10-CM | POA: Diagnosis not present

## 2018-09-04 DIAGNOSIS — H6983 Other specified disorders of Eustachian tube, bilateral: Secondary | ICD-10-CM | POA: Diagnosis not present

## 2018-09-04 DIAGNOSIS — H6980 Other specified disorders of Eustachian tube, unspecified ear: Secondary | ICD-10-CM | POA: Diagnosis not present

## 2018-09-04 DIAGNOSIS — J342 Deviated nasal septum: Secondary | ICD-10-CM | POA: Diagnosis not present

## 2018-09-04 NOTE — Telephone Encounter (Signed)
Please see mychart message.   From that encounter w/ patient:   I'm not sure if I need to answer those or if you already have and can disregard the letter I received from [facility].   I have no idea about other medications. It would be in my chart with Dr. Dossie Arbour.   I have been getting the name brand. Last year [employer] would only cover name brand.    Thank you for your assistance.     Clydie Braun

## 2018-09-04 NOTE — Telephone Encounter (Signed)
Per uploaded form, preferred medications are Concerat and dexmethylphenidate E.R.

## 2018-09-08 NOTE — Telephone Encounter (Signed)
So that means she's been on long acting and short-acting adderall and straterra- can we try the PA again?

## 2018-09-08 NOTE — Telephone Encounter (Signed)
Per [facility] patient has been on Adderall 5mg  and 7.5mg  and Strattera 18, 25, 40,60 and 80. Please see note from patient also.

## 2018-09-08 NOTE — Telephone Encounter (Signed)
[employer], medication was approved for generic. Brand name is preferred without a PA.

## 2018-09-12 ENCOUNTER — Encounter: Payer: Self-pay | Admitting: Neurology

## 2018-09-12 DIAGNOSIS — G4733 Obstructive sleep apnea (adult) (pediatric): Secondary | ICD-10-CM | POA: Diagnosis not present

## 2018-09-14 MED ORDER — MOMETASONE FUROATE 50 MCG/ACT NA SUSP: 2.0000 | Freq: Every evening | NASAL | 1 refills | Status: DC | PRN

## 2018-09-18 ENCOUNTER — Other Ambulatory Visit: Payer: Self-pay | Admitting: Family Medicine

## 2018-09-23 ENCOUNTER — Encounter
Admission: RE | Admit: 2018-09-23 | Discharge: 2018-09-23 | Disposition: A | Discharge status: Home / Self Care | Payer: BLUE CROSS/BLUE SHIELD | Source: Ambulatory Visit | Attending: Unknown Physician Specialty | Admitting: Unknown Physician Specialty

## 2018-09-23 ENCOUNTER — Other Ambulatory Visit: Payer: Self-pay

## 2018-09-23 DIAGNOSIS — I10 Essential (primary) hypertension: Secondary | ICD-10-CM | POA: Diagnosis not present

## 2018-09-23 DIAGNOSIS — Z01818 Encounter for other preprocedural examination: Secondary | ICD-10-CM | POA: Diagnosis not present

## 2018-09-23 HISTORY — DX: Other complications of anesthesia, initial encounter: T88.59XA

## 2018-09-23 HISTORY — DX: Other specified postprocedural states: Z98.890

## 2018-09-23 HISTORY — DX: Sleep apnea, unspecified: G47.30

## 2018-09-23 HISTORY — DX: Nausea with vomiting, unspecified: R11.2

## 2018-09-23 HISTORY — DX: Family history of other specified conditions: Z84.89

## 2018-09-23 HISTORY — DX: Adverse effect of unspecified anesthetic, initial encounter: T41.45XA

## 2018-09-23 NOTE — Patient Instructions (Addendum)
  Your procedure is scheduled on: Tuesday October 07, 2018 Report to Same Day Surgery 2nd floor [facility] Entrance-take elevator on left to 2nd floor.  Check in with surgery information desk.) To find out your arrival time, call [phone] 1:00-3:00 PM on Monday October 06, 2018  Remember: Instructions that are not followed completely may result in serious medical risk, up to and including death, or upon the discretion of your surgeon and anesthesiologist your surgery may need to be rescheduled.    __x__ 1. Do not eat food (including mints, candies, chewing gum) after midnight the night before your procedure. You may drink clear liquids up to 2 hours before you are scheduled to arrive at the hospital for your procedure.  Do not drink anything within 2 hours of your scheduled arrival to the hospital.  Approved clear liquids:  --Water or Apple juice without pulp  --Clear carbohydrate beverage such as Gatorade or Powerade  --Black Coffee or Clear Tea (No milk, no creamers, do not add anything to the coffee or tea)    __x__ 2. No Alcohol for 24 hours before or after surgery.   __x__ 3. No Smoking or e-cigarettes for 24 hours before surgery.  Do not use any chewable tobacco products for at least 6 hours before surgery.   __x__ 4. Notify your doctor if there is any change in your medical condition (cold, fever, infections).   __x__ 5. On the morning of surgery brush your teeth with toothpaste and water.  You may rinse your mouth with mouthwash if you wish.  Do not swallow any toothpaste or mouthwash.   __x__ Use antibacterial soap such as Dial to shower/bathe on the day of surgery.   Do not wear jewelry, make-up, hairpins, clips or nail polish on the day of surgery.  Do not wear lotions, powders, deodorant, or perfumes.   Do not shave below the face/neck 48 hours prior to surgery.   Do not bring valuables to the hospital.    [facility] is not responsible for any  belongings or valuables.               Contacts, dentures or bridgework may not be worn into surgery.  For patients discharged on the day of surgery, you will NOT be permitted to drive yourself home.    You must have a responsible adult with you for 24 hours after surgery.  __x__ Take these medicines on the morning of surgery with a SMALL SIP OF WATER:  1. Metoprolol/Toprol  2. Levothyroxine/Synthroid  3. Omeprazole/Prilosec  __x__ Bring C-Pap/Bi-Pap machine to the hospital.   __x__ Follow recommendations from Cardiologist, Pulmonologist or PCP regarding stopping Aspirin, Coumadin, Plavix, Eliquis, Effient, Pradaxa, and Pletal.  __x__ 7 days prior to surgery: Stop Anti-inflammatories such as Advil, Ibuprofen, Motrin, Aleve, Naproxen, Naprosyn, BC/Goodies powders or aspirin products. You may continue to take Tylenol and Celebrex.   __x__ 7 days prior to surgery: Stop supplements until after surgery. You may continue to take Vitamin D, Vitamin B, and multivitamin.

## 2018-10-07 ENCOUNTER — Ambulatory Visit: Payer: BLUE CROSS/BLUE SHIELD | Admitting: Certified Registered"

## 2018-10-07 ENCOUNTER — Ambulatory Visit
Admission: RE | Admit: 2018-10-07 | Discharge: 2018-10-07 | Disposition: A | Discharge status: Home / Self Care | Payer: BLUE CROSS/BLUE SHIELD | Attending: Unknown Physician Specialty | Admitting: Unknown Physician Specialty

## 2018-10-07 ENCOUNTER — Other Ambulatory Visit: Payer: Self-pay

## 2018-10-07 ENCOUNTER — Encounter
Admission: RE | Disposition: A | Discharge status: Home / Self Care | Payer: Self-pay | Source: Home / Self Care | Attending: Unknown Physician Specialty

## 2018-10-07 ENCOUNTER — Encounter: Payer: Self-pay | Admitting: *Deleted

## 2018-10-07 DIAGNOSIS — F17200 Nicotine dependence, unspecified, uncomplicated: Secondary | ICD-10-CM | POA: Diagnosis not present

## 2018-10-07 DIAGNOSIS — F419 Anxiety disorder, unspecified: Secondary | ICD-10-CM | POA: Insufficient documentation

## 2018-10-07 DIAGNOSIS — I10 Essential (primary) hypertension: Secondary | ICD-10-CM | POA: Diagnosis not present

## 2018-10-07 DIAGNOSIS — K219 Gastro-esophageal reflux disease without esophagitis: Secondary | ICD-10-CM | POA: Diagnosis not present

## 2018-10-07 DIAGNOSIS — Z79890 Hormone replacement therapy (postmenopausal): Secondary | ICD-10-CM | POA: Insufficient documentation

## 2018-10-07 DIAGNOSIS — J3489 Other specified disorders of nose and nasal sinuses: Secondary | ICD-10-CM | POA: Diagnosis not present

## 2018-10-07 DIAGNOSIS — E039 Hypothyroidism, unspecified: Secondary | ICD-10-CM | POA: Insufficient documentation

## 2018-10-07 DIAGNOSIS — J343 Hypertrophy of nasal turbinates: Secondary | ICD-10-CM | POA: Insufficient documentation

## 2018-10-07 DIAGNOSIS — J342 Deviated nasal septum: Secondary | ICD-10-CM | POA: Insufficient documentation

## 2018-10-07 DIAGNOSIS — G4730 Sleep apnea, unspecified: Secondary | ICD-10-CM | POA: Insufficient documentation

## 2018-10-07 DIAGNOSIS — F329 Major depressive disorder, single episode, unspecified: Secondary | ICD-10-CM | POA: Diagnosis not present

## 2018-10-07 DIAGNOSIS — Z79899 Other long term (current) drug therapy: Secondary | ICD-10-CM | POA: Diagnosis not present

## 2018-10-07 HISTORY — PX: NASAL SEPTOPLASTY W/ TURBINOPLASTY: SHX2070

## 2018-10-07 LAB — URINE DRUG SCREEN, QUALITATIVE (ARMC ONLY)
Amphetamines, Ur Screen: POSITIVE — AB
BARBITURATES, UR SCREEN: NOT DETECTED
Benzodiazepine, Ur Scrn: NOT DETECTED
Cannabinoid 50 Ng, Ur ~~LOC~~: NOT DETECTED
Cocaine Metabolite,Ur ~~LOC~~: NOT DETECTED
MDMA (Ecstasy)Ur Screen: NOT DETECTED
Methadone Scn, Ur: NOT DETECTED
Opiate, Ur Screen: NOT DETECTED
Phencyclidine (PCP) Ur S: NOT DETECTED
Tricyclic, Ur Screen: NOT DETECTED

## 2018-10-07 LAB — POCT PREGNANCY, URINE: Preg Test, Ur: NEGATIVE

## 2018-10-07 SURGERY — SEPTOPLASTY, NOSE, WITH NASAL TURBINATE REDUCTION
Anesthesia: General | Laterality: Bilateral

## 2018-10-07 MED ORDER — LIDOCAINE-EPINEPHRINE 1 %-1:100000 IJ SOLN
INTRAMUSCULAR | Status: DC | PRN
  Administered 2018-10-07: 12 mL

## 2018-10-07 MED ORDER — LIDOCAINE HCL (CARDIAC) PF 100 MG/5ML IV SOSY
PREFILLED_SYRINGE | INTRAVENOUS | Status: DC | PRN
  Administered 2018-10-07: 60 mg via INTRAVENOUS

## 2018-10-07 MED ORDER — FENTANYL CITRATE (PF) 100 MCG/2ML IJ SOLN
INTRAMUSCULAR | Status: AC
  Filled 2018-10-07: qty 2

## 2018-10-07 MED ORDER — ACETAMINOPHEN 10 MG/ML IV SOLN
INTRAVENOUS | Status: AC
  Filled 2018-10-07: qty 100

## 2018-10-07 MED ORDER — PHENYLEPHRINE HCL 10 % OP SOLN
OPHTHALMIC | Status: DC | PRN
  Administered 2018-10-07: 10 mL via NASAL

## 2018-10-07 MED ORDER — FENTANYL CITRATE (PF) 100 MCG/2ML IJ SOLN: 25.0000 ug | INTRAMUSCULAR | Status: DC | PRN

## 2018-10-07 MED ORDER — MIDAZOLAM HCL 2 MG/2ML IJ SOLN
INTRAMUSCULAR | Status: DC | PRN
  Administered 2018-10-07: 2 mg via INTRAVENOUS

## 2018-10-07 MED ORDER — PHENYLEPHRINE HCL 10 MG/ML IJ SOLN
INTRAMUSCULAR | Status: AC
  Filled 2018-10-07: qty 1

## 2018-10-07 MED ORDER — ONDANSETRON HCL 4 MG/2ML IJ SOLN
INTRAMUSCULAR | Status: DC | PRN
  Administered 2018-10-07: 4 mg via INTRAVENOUS

## 2018-10-07 MED ORDER — DEXAMETHASONE SODIUM PHOSPHATE 10 MG/ML IJ SOLN
INTRAMUSCULAR | Status: AC
  Filled 2018-10-07: qty 1

## 2018-10-07 MED ORDER — PROPOFOL 10 MG/ML IV BOLUS
INTRAVENOUS | Status: DC | PRN
  Administered 2018-10-07: 100 mg via INTRAVENOUS

## 2018-10-07 MED ORDER — OXYMETAZOLINE HCL 0.05 % NA SOLN
NASAL | Status: AC
  Filled 2018-10-07: qty 30

## 2018-10-07 MED ORDER — LIDOCAINE HCL (PF) 2 % IJ SOLN
INTRAMUSCULAR | Status: AC
  Filled 2018-10-07: qty 10

## 2018-10-07 MED ORDER — FENTANYL CITRATE (PF) 100 MCG/2ML IJ SOLN
INTRAMUSCULAR | Status: DC | PRN
  Administered 2018-10-07 (×2): 50 ug via INTRAVENOUS

## 2018-10-07 MED ORDER — PHENYLEPHRINE HCL 10 MG/ML IJ SOLN
INTRAMUSCULAR | Status: DC | PRN
  Administered 2018-10-07 (×2): 100 ug via INTRAVENOUS
  Administered 2018-10-07: 200 ug via INTRAVENOUS
  Administered 2018-10-07 (×2): 100 ug via INTRAVENOUS
  Administered 2018-10-07: 200 ug via INTRAVENOUS
  Administered 2018-10-07 (×2): 100 ug via INTRAVENOUS

## 2018-10-07 MED ORDER — LACTATED RINGERS IV SOLN
INTRAVENOUS | Status: DC
  Administered 2018-10-07: 08:00:00 via INTRAVENOUS

## 2018-10-07 MED ORDER — MIDAZOLAM HCL 2 MG/2ML IJ SOLN
INTRAMUSCULAR | Status: AC
  Filled 2018-10-07: qty 2

## 2018-10-07 MED ORDER — ONDANSETRON HCL 4 MG/2ML IJ SOLN
INTRAMUSCULAR | Status: AC
  Filled 2018-10-07: qty 2

## 2018-10-07 MED ORDER — SUGAMMADEX SODIUM 200 MG/2ML IV SOLN
INTRAVENOUS | Status: AC
  Filled 2018-10-07: qty 2

## 2018-10-07 MED ORDER — PROPOFOL 10 MG/ML IV BOLUS
INTRAVENOUS | Status: AC
  Filled 2018-10-07: qty 20

## 2018-10-07 MED ORDER — BACITRACIN-NEOMYCIN-POLYMYXIN OINTMENT TUBE
TOPICAL_OINTMENT | CUTANEOUS | Status: DC | PRN
  Administered 2018-10-07: 1 via TOPICAL

## 2018-10-07 MED ORDER — VASOPRESSIN 20 UNIT/ML IV SOLN
INTRAVENOUS | Status: DC | PRN
  Administered 2018-10-07: 1 [IU] via INTRAVENOUS

## 2018-10-07 MED ORDER — ROCURONIUM BROMIDE 50 MG/5ML IV SOLN
INTRAVENOUS | Status: AC
  Filled 2018-10-07: qty 1

## 2018-10-07 MED ORDER — DEXAMETHASONE SODIUM PHOSPHATE 10 MG/ML IJ SOLN
INTRAMUSCULAR | Status: DC | PRN
  Administered 2018-10-07: 10 mg via INTRAVENOUS

## 2018-10-07 MED ORDER — SUGAMMADEX SODIUM 200 MG/2ML IV SOLN
INTRAVENOUS | Status: DC | PRN
  Administered 2018-10-07: 200 mg via INTRAVENOUS

## 2018-10-07 MED ORDER — EPHEDRINE SULFATE 50 MG/ML IJ SOLN
INTRAMUSCULAR | Status: DC | PRN
  Administered 2018-10-07 (×2): 5 mg via INTRAVENOUS

## 2018-10-07 MED ORDER — ACETAMINOPHEN 10 MG/ML IV SOLN
INTRAVENOUS | Status: DC | PRN
  Administered 2018-10-07: 1000 mg via INTRAVENOUS

## 2018-10-07 MED ORDER — HYDROCODONE-ACETAMINOPHEN 5-300 MG PO TABS: 1.0000 | ORAL_TABLET | ORAL | 0 refills | Status: DC | PRN

## 2018-10-07 MED ORDER — ONDANSETRON HCL 4 MG/2ML IJ SOLN: 4.0000 mg | Freq: Once | INTRAMUSCULAR | Status: DC | PRN

## 2018-10-07 MED ORDER — LIDOCAINE HCL (PF) 4 % IJ SOLN
INTRAMUSCULAR | Status: AC
  Filled 2018-10-07: qty 10

## 2018-10-07 MED ORDER — ROCURONIUM BROMIDE 100 MG/10ML IV SOLN
INTRAVENOUS | Status: DC | PRN
  Administered 2018-10-07: 30 mg via INTRAVENOUS
  Administered 2018-10-07: 10 mg via INTRAVENOUS

## 2018-10-07 MED ORDER — SULFAMETHOXAZOLE-TRIMETHOPRIM 800-160 MG PO TABS: 1.0000 | ORAL_TABLET | Freq: Two times a day (BID) | ORAL | 0 refills | Status: DC

## 2018-10-07 MED ORDER — OXYMETAZOLINE HCL 0.05 % NA SOLN
1.0000 | Freq: Two times a day (BID) | NASAL | Status: DC
  Administered 2018-10-07: 1 via NASAL

## 2018-10-07 SURGICAL SUPPLY — 24 items
BLADE SURG 15 STRL LF DISP TIS (BLADE) ×1 IMPLANT
BLADE SURG 15 STRL SS (BLADE) ×1
BNDG EYE OVAL (GAUZE/BANDAGES/DRESSINGS) ×1 IMPLANT
CANISTER SUCT 1200ML W/VALVE (MISCELLANEOUS) ×2 IMPLANT
COAG SUCT 10F 3.5MM HAND CTRL (MISCELLANEOUS) ×2 IMPLANT
COVER WAND RF STERILE (DRAPES) ×1 IMPLANT
DRESSING NASL FOAM PST OP SINU (MISCELLANEOUS) ×2 IMPLANT
DRSG NASAL FOAM POST OP SINU (MISCELLANEOUS) ×4
ELECT REM PT RETURN 9FT ADLT (ELECTROSURGICAL) ×2
ELECTRODE REM PT RTRN 9FT ADLT (ELECTROSURGICAL) ×1 IMPLANT
GLOVE BIO SURGEON STRL SZ7.5 (GLOVE) ×4 IMPLANT
GOWN STRL REUS W/ TWL LRG LVL3 (GOWN DISPOSABLE) ×2 IMPLANT
GOWN STRL REUS W/TWL LRG LVL3 (GOWN DISPOSABLE) ×2
LABEL OR SOLS (LABEL) ×1 IMPLANT
NS IRRIG 500ML POUR BTL (IV SOLUTION) ×2 IMPLANT
PACK HEAD/NECK (MISCELLANEOUS) ×2 IMPLANT
SPLINT NASAL REUTER .5MM (MISCELLANEOUS) ×2 IMPLANT
SPONGE NEURO XRAY DETECT 1X3 (DISPOSABLE) ×2 IMPLANT
SUT CHROMIC 3-0 (SUTURE) ×1
SUT CHROMIC 3-0 KS 27XMFL CR (SUTURE) ×1
SUT ETHILON 3-0 KS 30 BLK (SUTURE) ×2 IMPLANT
SUT PLAIN GUT 4-0 (SUTURE) ×2 IMPLANT
SUTURE CHRMC 3-0 KS 27XMFL CR (SUTURE) ×1 IMPLANT
WATER STERILE IRR 1000ML POUR (IV SOLUTION) ×2 IMPLANT

## 2018-10-07 NOTE — Discharge Instructions (Signed)

## 2018-10-07 NOTE — H&P (Signed)
The patient's history has been reviewed, patient examined, no change in status, stable for surgery.  Questions were answered to the patients satisfaction.  

## 2018-10-07 NOTE — Anesthesia Preprocedure Evaluation (Signed)
Anesthesia Evaluation  Patient identified by MRN, date of birth, ID band Patient awake    Reviewed: Allergy & Precautions, NPO status , Patient's Chart, lab work & pertinent test results  History of Anesthesia Complications (+) PONV and history of anesthetic complications  Airway Mallampati: III       Dental  (+) Upper Dentures   Pulmonary sleep apnea , Current Smoker,     + decreased breath sounds      Cardiovascular Exercise Tolerance: Good hypertension, Pt. on home beta blockers Normal cardiovascular exam     Neuro/Psych PSYCHIATRIC DISORDERS Anxiety Depression    GI/Hepatic GERD  ,(+)     substance abuse  cocaine use and marijuana use,   Endo/Other  Hypothyroidism   Renal/GU negative Renal ROS     Musculoskeletal negative musculoskeletal ROS (+)   Abdominal Normal abdominal exam  (+)   Peds  Hematology  (+) anemia ,   Anesthesia Other Findings   Reproductive/Obstetrics                             Anesthesia Physical  Anesthesia Plan  ASA: II  Anesthesia Plan: General   Post-op Pain Management:    Induction: Intravenous  PONV Risk Score and Plan:   Airway Management Planned: Oral ETT  Additional Equipment:   Intra-op Plan:   Post-operative Plan: Extubation in OR  Informed Consent: I have reviewed the patients History and Physical, chart, labs and discussed the procedure including the risks, benefits and alternatives for the proposed anesthesia with the patient or authorized representative who has indicated his/her understanding and acceptance.       Plan Discussed with: CRNA  Anesthesia Plan Comments:         Anesthesia Quick Evaluation

## 2018-10-07 NOTE — Anesthesia Postprocedure Evaluation (Signed)
Anesthesia Post Note  Patient: Pamela Avila  Procedure(s) Performed: SEPTOPLASTY WITH BILATERAL SUBMUCOUS RESECTION OF INFERIOR TURBINATES (Bilateral )  Patient location during evaluation: PACU Anesthesia Type: General Level of consciousness: awake and alert and oriented Pain management: pain level controlled Vital Signs Assessment: post-procedure vital signs reviewed and stable Respiratory status: spontaneous breathing Cardiovascular status: blood pressure returned to baseline Anesthetic complications: no     Last Vitals:  Vitals:   10/07/18 1115 10/07/18 1146  BP: 105/81 110/71  Pulse: 99 (!) 103  Resp: 16 16  Temp: 36.6 C   SpO2: 97% 99%    Last Pain:  Vitals:   10/07/18 1146  TempSrc:   PainSc: 1                  [name]

## 2018-10-07 NOTE — Transfer of Care (Signed)
Immediate Anesthesia Transfer of Care Note  Patient: Pamela Avila  Procedure(s) Performed: SEPTOPLASTY WITH BILATERAL SUBMUCOUS RESECTION OF INFERIOR TURBINATES (Bilateral )  Patient Location: PACU  Anesthesia Type:General  Level of Consciousness: awake  Airway & Oxygen Therapy: Patient Spontanous Breathing and Patient connected to face mask oxygen  Post-op Assessment: Report given to RN and Post -op Vital signs reviewed and stable  Post vital signs: Reviewed  Last Vitals:  Vitals Value Taken Time  BP 116/63 10/07/2018 10:32 AM  Temp    Pulse 95 10/07/2018 10:32 AM  Resp 21 10/07/2018 10:32 AM  SpO2 94 % 10/07/2018 10:32 AM  Vitals shown include unvalidated device data.  Last Pain:  Vitals:   10/07/18 0741  TempSrc: Temporal  PainSc: 3          Complications: No apparent anesthesia complications

## 2018-10-07 NOTE — Op Note (Signed)
PREOPERATIVE DIAGNOSIS:  Chronic nasal obstruction.  POSTOPERATIVE DIAGNOSIS:  Chronic nasal obstruction.  SURGEON:  Davina Poke, M.D.  NAME OF PROCEDURE:  1. Nasal septoplasty. 2. Submucous resection of inferior turbinates.  OPERATIVE FINDINGS:  Severe nasal septal deformity, hypertrophy of the inferior turbinates.   DESCRIPTION OF THE PROCEDURE:  Pamela Avila was identified in the holding area and taken to the operating room and placed in the supine position.  After general endotracheal anesthesia was induced, the table was turned 45 degrees and the patient was placed in a semi-Fowler position.  The nose was then topically anesthetized with Lidocaine, cotton pledgets were placed within each nostril. After approximately 5 minutes, this was removed at which time a local anesthetic of 1% Lidocaine 1:100,000 units of Epinephrine was used to inject the inferior turbinates in the nasal septum. A total of 12 ml was used. Examination of the nose showed a severe right nasal septal deformity and tremendous hypertrophied inferior turbinate.  Beginning on the right hand side a hemitransfixion incision was then created on the leading edge of the septum on the right.  A subperichondrial plane was elevated posteriorly on the left and taken back to the perpendicular plate of the ethmoid where subperiosteal plane was elevated posteriorly on the left.  An inferior rim of cartilage was removed anteriorly with care taken to leave an anterior strut to prevent nasal collapse. With this strut removed the perpendicular plate of the ethmoid was separated from the quadrangular cartilage. The large septal curvature was corrected and small portion of the curved cartilage was removed.  The septum was then replaced in the midline. Reinspection through each nostril showed excellent reduction of the septal deformity. A right posterior inferior fenestration was then created to allow hematoma drainage.  With the septoplasty  completed, beginning on the left-hand side, a 15 blade was used to incise along the inferior edge of the inferior turbinate. A superior laterally based flap was then elevated. The underlying conchal bone of mucosa was excised using Knight scissors. The flap was then laid back over the turbinate stump and cauterized using suction cautery. In a similar fashion the submucous resection was performed on the right.  With the submucous resection completed bilaterally and no active bleeding, the hemitransfixion incision was then closed using two interrupted 3-0 chromic sutures.  Plastic nasal septal splints were placed within each nostril and affixed to the septum using a 3-0 nylon suture. Stammberger was then used beneath each inferior turbinate for hemostasis.    The patient tolerated the procedure well, was returned to anesthesia, extubated in the operating room, and taken to the recovery room in stable condition.    CULTURES:  None.  SPECIMENS:  None.  ESTIMATED BLOOD LOSS:  25 cc.  Davina Poke  10/07/2018  10:33 AM

## 2018-10-07 NOTE — Anesthesia Post-op Follow-up Note (Signed)
Anesthesia QCDR form completed.        

## 2018-10-07 NOTE — Anesthesia Procedure Notes (Signed)
Procedure Name: Intubation Date/Time: 10/07/2018 9:25 AM Performed by: Ancil Boozer, RN Pre-anesthesia Checklist: Patient identified, Patient being monitored, Timeout performed, Emergency Drugs available and Suction available Patient Re-evaluated:Patient Re-evaluated prior to induction Oxygen Delivery Method: Circle system utilized Preoxygenation: Pre-oxygenation with 100% oxygen Induction Type: IV induction Ventilation: Mask ventilation without difficulty Laryngoscope Size: Miller and 2 Grade View: Grade I Tube type: Oral Tube size: 7.0 mm Number of attempts: 1 Airway Equipment and Method: Stylet Placement Confirmation: ETT inserted through vocal cords under direct vision,  positive ETCO2 and breath sounds checked- equal and bilateral Secured at: 21 cm Tube secured with: Tape Dental Injury: Teeth and Oropharynx as per pre-operative assessment

## 2018-10-11 DIAGNOSIS — G4733 Obstructive sleep apnea (adult) (pediatric): Secondary | ICD-10-CM | POA: Diagnosis not present

## 2018-10-14 ENCOUNTER — Encounter: Payer: Self-pay | Admitting: Neurology
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
## 2018-10-16 ENCOUNTER — Encounter: Payer: Self-pay | Admitting: Nurse Practitioner

## 2018-10-16 ENCOUNTER — Ambulatory Visit (INDEPENDENT_AMBULATORY_CARE_PROVIDER_SITE_OTHER): Payer: BLUE CROSS/BLUE SHIELD | Admitting: Nurse Practitioner

## 2018-10-16 DIAGNOSIS — Z9989 Dependence on other enabling machines and devices: Secondary | ICD-10-CM

## 2018-10-16 DIAGNOSIS — G4733 Obstructive sleep apnea (adult) (pediatric): Secondary | ICD-10-CM | POA: Diagnosis not present

## 2018-10-16 NOTE — Patient Instructions (Signed)
CPAP compliance 30% greater than 4 hours Patient had recent septoplasty with stents Continue same settings Follow-up in 3 months

## 2018-10-24 ENCOUNTER — Ambulatory Visit (INDEPENDENT_AMBULATORY_CARE_PROVIDER_SITE_OTHER): Payer: BLUE CROSS/BLUE SHIELD | Admitting: Family Medicine

## 2018-10-24 ENCOUNTER — Encounter: Payer: Self-pay | Admitting: Family Medicine

## 2018-10-24 ENCOUNTER — Other Ambulatory Visit: Payer: Self-pay

## 2018-10-24 VITALS — BP 128/85 | HR 99 | Temp 98.1°F | Ht 61.0 in | Wt 186.0 lb

## 2018-10-24 DIAGNOSIS — F901 Attention-deficit hyperactivity disorder, predominantly hyperactive type: Secondary | ICD-10-CM

## 2018-10-24 MED ORDER — DICYCLOMINE HCL 10 MG PO CAPS: 10.0000 mg | ORAL_CAPSULE | Freq: Three times a day (TID) | ORAL | 2 refills | Status: DC | PRN

## 2018-10-24 MED ORDER — AMPHETAMINE-DEXTROAMPHETAMINE 10 MG PO TABS: ORAL_TABLET | ORAL | 0 refills | Status: DC

## 2018-10-24 MED ORDER — BUSPIRONE HCL 5 MG PO TABS: 5.0000 mg | ORAL_TABLET | Freq: Three times a day (TID) | ORAL | 1 refills | Status: DC | PRN

## 2018-10-24 MED ORDER — LEVOTHYROXINE SODIUM 100 MCG PO TABS: 100.0000 ug | ORAL_TABLET | Freq: Every day | ORAL | 0 refills | Status: DC

## 2018-10-24 MED ORDER — CITALOPRAM HYDROBROMIDE 40 MG PO TABS: 60.0000 mg | ORAL_TABLET | Freq: Every day | ORAL | 1 refills | Status: DC

## 2018-10-24 MED ORDER — ATOMOXETINE HCL 80 MG PO CAPS: 80.0000 mg | ORAL_CAPSULE | Freq: Every day | ORAL | 1 refills | Status: DC

## 2018-10-24 MED ORDER — AMPHETAMINE-DEXTROAMPHET ER 30 MG PO CP24: 30.0000 mg | ORAL_CAPSULE | ORAL | 0 refills | Status: DC

## 2018-10-24 MED ORDER — AMPHETAMINE-DEXTROAMPHET ER 5 MG PO CP24: 5.0000 mg | ORAL_CAPSULE | Freq: Every day | ORAL | 0 refills | Status: DC

## 2018-10-24 MED ORDER — BUPROPION HCL ER (XL) 300 MG PO TB24: 300.0000 mg | ORAL_TABLET | Freq: Every day | ORAL | 1 refills | Status: DC

## 2018-10-24 MED ORDER — METOPROLOL SUCCINATE ER 25 MG PO TB24: 37.5000 mg | ORAL_TABLET | Freq: Every day | ORAL | 1 refills | Status: DC

## 2018-10-24 MED ORDER — OMEPRAZOLE 20 MG PO CPDR: 20.0000 mg | DELAYED_RELEASE_CAPSULE | Freq: Two times a day (BID) | ORAL | 1 refills | Status: DC | PRN

## 2018-10-24 NOTE — Patient Instructions (Signed)
.  [facility]illes Tendinitis Rehab  Ask your health care provider which exercises are safe for you. Do exercises exactly as told by your health care provider and adjust them as directed. It is normal to feel mild stretching, pulling, tightness, or discomfort as you do these exercises, but you should stop right away if you feel sudden pain or your pain gets worse. Do not begin these exercises until told by your health care provider.  Stretching and range of motion exercises  These exercises warm up your muscles and joints and improve the movement and flexibility of your ankle. These exercises also help to relieve pain, numbness, and tingling.  Exercise A: Standing wall calf stretch, knee straight    1. Stand with your hands against a wall.  2. Extend your __________ leg behind you and bend your front knee slightly. Keep both of your heels on the floor.  3. Point the toes of your back foot slightly inward.  4. Keeping your heels on the floor and your back knee straight, shift your weight toward the wall. Do not allow your back to arch. You should feel a gentle stretch in your calf.  5. Hold this position for seconds.  Repeat __________ times. Complete this stretch __________ times per day.  Exercise B: Standing wall calf stretch, knee bent  1. Stand with your hands against a wall.  2. Extend your __________ leg behind you, and bend your front knee slightly. Keep both of your heels on the floor.  3. Point the toes of your back foot slightly inward.  4. Keeping your heels on the floor, unlock your back knee so that it is bent. You should feel a gentle stretch deep in your calf.  5. Hold this position for __________ seconds.  Repeat __________ times. Complete this stretch __________ times per day.  Strengthening exercises  These exercises build strength and control of your ankle. Endurance is the ability to use your muscles for a long time, even after they get tired.  Exercise C: Plantar flexion with band    1. Sit on the  floor with your __________ leg extended. You may put a pillow under your calf to give your foot more room to move.  2. Loop a rubber exercise band or tube around the ball of your __________ foot. The ball of your foot is on the walking surface, right under your toes. The band or tube should be slightly tense when your foot is relaxed. If the band or tube slips, you can put on your shoe or put a washcloth between the band and your foot to help it stay in place.  3. Slowly point your toes downward, pushing them away from you.  4. Hold this position for __________ seconds.  5. Slowly release the tension in the band or tube, controlling smoothly until your foot is back to the starting position.  Repeat __________ times. Complete this exercise __________ times per day.  Exercise D: Heel raise with eccentric lower    1. Stand on a step with the balls of your feet. The ball of your foot is on the walking surface, right under your toes.  ? Do not put your heels on the step.  ? For balance, rest your hands on the wall or on a railing.  2. Rise up onto the balls of your feet.  3. Keeping your heels up, shift all of your weight to your __________ leg and pick up your other leg.  4. Slowly lower   your __________ leg so your heel drops below the level of the step.  5. Put down your foot.  If told by your health care provider, build up to:  · 3 sets of 15 repetitions while keeping your knees straight.  · 3 sets of 15 repetitions while keeping your knees bent as far as told by your health care provider.  Complete this exercise __________ times per day.  If this exercise is too easy, try doing it while wearing a backpack with weights in it.  Balance exercises  These exercises improve or maintain your balance. Balance is important in preventing falls.  Exercise E: Single leg stand  1. Without shoes, stand near a railing or in a door frame. Hold on to the railing or door frame as needed.  2. Stand on your __________ foot. Keep your  big toe down on the floor and try to keep your arch lifted.  3. Hold this position for __________ seconds.  Repeat __________ times. Complete this exercise __________ times per day.  If this exercise is too easy, you can try it with your eyes closed or while standing on a pillow.  This information is not intended to replace advice given to you by your health care provider. Make sure you discuss any questions you have with your health care provider.  Document Released: 02/28/2005 Document Revised: 01/14/2017 Document Reviewed: 04/05/2015  [employer]vier Interactive Patient Education © 2019 [employer]

## 2018-10-24 NOTE — Progress Notes (Signed)
BP 128/85   Pulse 99   Temp 98.1 F (36.7 C) (Oral)   Ht 5\' 1"  (1.549 m)   Wt 186 lb (84.4 kg)   SpO2 94%   BMI 35.14 kg/m    Subjective:    Patient ID: Pamela Avila, female    DOB: 06/21/1971, 48 y.o.   MRN: [id number]  HPI: Pamela Avila is a 48 y.o. female  Chief Complaint  Patient presents with  . ADHD    4m f/u   ADHD FOLLOW UP ADHD status: controlled Satisfied with current therapy: yes Medication compliance:  excellent compliance Controlled substance contract: yes Previous psychiatry evaluation: no Previous medications: yes    Taking meds on weekends/vacations: yes Work/school performance:  good Difficulty sustaining attention/completing tasks: no Distracted by extraneous stimuli: no Does not listen when spoken to: no  Fidgets with hands or feet: no Unable to stay in seat: no Blurts out/interrupts others: no ADHD Medication Side Effects: no    Decreased appetite: no    Headache: no    Sleeping disturbance pattern: no    Irritability: no    Rebound effects (worse than baseline) off medication: no    Anxiousness: no    Dizziness: no    Tics: no   Depression screen [facility] 2/9 10/24/2018 04/25/2018 12/16/2017 11/12/2017 10/07/2017  Decreased Interest 1 2 1 1 1   Down, Depressed, Hopeless 1 2 1 1 1   PHQ - 2 Score 2 4 2 2 2   Altered sleeping 1 2 2 1 1   Tired, decreased energy 1 2 2 1 1   Change in appetite 0 1 1 0 1  Feeling bad or failure about yourself  0 1 1 0 1  Trouble concentrating 1 1 1 2 3   Moving slowly or fidgety/restless 0 0 0 0 0  Suicidal thoughts 0 0 0 0 0  PHQ-9 Score 5 11 9 6 9   Difficult doing work/chores Somewhat difficult Somewhat difficult Very difficult - -   GAD 7 : Generalized Anxiety Score 10/24/2018 04/25/2018 12/16/2017 11/12/2017  Nervous, Anxious, on Edge 1 1 1  0  Control/stop worrying 0 0 1 -  Worry too much - different things 0 0 0 0  Trouble relaxing 1 2 1 1   Restless 0 0 0 0  Easily annoyed or irritable 1 2 1 1   Afraid - awful might  happen 0 0 0 0  Total GAD 7 Score 3 5 4  -  Anxiety Difficulty Somewhat difficult Somewhat difficult Somewhat difficult Somewhat difficult      Relevant past medical, surgical, family and social history reviewed and updated as indicated. Interim medical history since our last visit reviewed. Allergies and medications reviewed and updated.  Review of Systems  Constitutional: Negative.   Respiratory: Negative.   Psychiatric/Behavioral: Negative.     Per HPI unless specifically indicated above     Objective:    BP 128/85   Pulse 99   Temp 98.1 F (36.7 C) (Oral)   Ht 5\' 1"  (1.549 m)   Wt 186 lb (84.4 kg)   SpO2 94%   BMI 35.14 kg/m   Wt Readings from Last 3 Encounters:  10/24/18 186 lb (84.4 kg)  10/16/18 186 lb 9.6 oz (84.6 kg)  10/07/18 182 lb 4.8 oz (82.7 kg)    Physical Exam Vitals signs and nursing note reviewed.  Constitutional:      General: She is not in acute distress.    Appearance: Normal appearance. She is not ill-appearing, toxic-appearing or  diaphoretic.  HENT:     Head: Normocephalic and atraumatic.     Right Ear: External ear normal.     Left Ear: External ear normal.     Nose: Nose normal.     Mouth/Throat:     Mouth: Mucous membranes are moist.     Pharynx: Oropharynx is clear.  Eyes:     General: No scleral icterus.       Right eye: No discharge.        Left eye: No discharge.     Extraocular Movements: Extraocular movements intact.     Conjunctiva/sclera: Conjunctivae normal.     Pupils: Pupils are equal, round, and reactive to light.  Neck:     Musculoskeletal: Normal range of motion and neck supple.  Cardiovascular:     Rate and Rhythm: Normal rate and regular rhythm.     Pulses: Normal pulses.     Heart sounds: Normal heart sounds. No murmur. No friction rub. No gallop.   Pulmonary:     Effort: Pulmonary effort is normal. No respiratory distress.     Breath sounds: Normal breath sounds. No stridor. No wheezing, rhonchi or rales.   Chest:     Chest wall: No tenderness.  Musculoskeletal: Normal range of motion.  Skin:    General: Skin is warm and dry.     Capillary Refill: Capillary refill takes less than 2 seconds.     Coloration: Skin is not jaundiced or pale.     Findings: No bruising, erythema, lesion or rash.  Neurological:     General: No focal deficit present.     Mental Status: She is alert and oriented to person, place, and time. Mental status is at baseline.  Psychiatric:        Mood and Affect: Mood normal.        Behavior: Behavior normal.        Thought Content: Thought content normal.        Judgment: Judgment normal.     Results for orders placed or performed during the hospital encounter of 10/07/18  Urine Drug Screen, Qualitative ([facility] only)  Result Value Ref Range   Tricyclic, Ur Screen NONE DETECTED NONE DETECTED   Amphetamines, Ur Screen POSITIVE (A) NONE DETECTED   MDMA (Ecstasy)Ur Screen NONE DETECTED NONE DETECTED   Cocaine Metabolite,Ur [place] NONE DETECTED NONE DETECTED   Opiate, Ur Screen NONE DETECTED NONE DETECTED   Phencyclidine (PCP) Ur S NONE DETECTED NONE DETECTED   Cannabinoid 50 Ng, Ur [place] NONE DETECTED NONE DETECTED   Barbiturates, Ur Screen NONE DETECTED NONE DETECTED   Benzodiazepine, Ur Scrn NONE DETECTED NONE DETECTED   Methadone Scn, Ur NONE DETECTED NONE DETECTED  Pregnancy, urine POC  Result Value Ref Range   Preg Test, Ur NEGATIVE NEGATIVE      Assessment & Plan:   Problem List Items Addressed This Visit      Other   ADHD (attention deficit hyperactivity disorder) - Primary    Stable. Continue current regimen. Call with any concerns. Refills for 3 months given today.          Follow up plan: Return in about 3 months (around 01/24/2019) for Physical/ADD.

## 2018-10-24 NOTE — Assessment & Plan Note (Signed)
Stable. Continue current regimen. Call with any concerns. Refills for 3 months given today.

## 2018-10-29 ENCOUNTER — Encounter: Payer: Self-pay | Admitting: Family Medicine

## 2018-10-30 ENCOUNTER — Encounter: Payer: Self-pay | Admitting: Family Medicine

## 2018-10-30 ENCOUNTER — Other Ambulatory Visit: Payer: Self-pay

## 2018-10-30 ENCOUNTER — Ambulatory Visit (INDEPENDENT_AMBULATORY_CARE_PROVIDER_SITE_OTHER): Payer: BLUE CROSS/BLUE SHIELD | Admitting: Family Medicine

## 2018-10-30 VITALS — BP 103/72 | HR 97 | Temp 98.4°F | Ht 61.0 in

## 2018-10-30 DIAGNOSIS — J069 Acute upper respiratory infection, unspecified: Secondary | ICD-10-CM | POA: Diagnosis not present

## 2018-10-30 DIAGNOSIS — R509 Fever, unspecified: Secondary | ICD-10-CM | POA: Diagnosis not present

## 2018-10-30 DIAGNOSIS — B9789 Other viral agents as the cause of diseases classified elsewhere: Secondary | ICD-10-CM

## 2018-10-30 NOTE — Progress Notes (Signed)
BP 103/72   Pulse 97   Temp 98.4 F (36.9 C) (Oral)   Ht 5\' 1"  (1.549 m)   SpO2 98%   BMI 35.14 kg/m    Subjective:    Patient ID: Pamela Avila, female    DOB: 1971-01-17, 48 y.o.   MRN: [id number]  HPI: Pamela Avila is a 48 y.o. female  Chief Complaint  Patient presents with  . Cough    x 2 days/ has tried OTC tylenol  . Fever    100.6 last night and this am  . Generalized Body Aches  . Nasal Congestion  . upset stomach   Here today with fever, cough, generalized body aches, congestion, nausea x 2 days. Denies Cp, SOB, vomiting, diarrhea, sore throat. Daughter also with same sxs. No recent travel outside the country, known COVID 19 positive contacts, hx of pulmonary dz.   Relevant past medical, surgical, family and social history reviewed and updated as indicated. Interim medical history since our last visit reviewed. Allergies and medications reviewed and updated.  Review of Systems  Per HPI unless specifically indicated above     Objective:    BP 103/72   Pulse 97   Temp 98.4 F (36.9 C) (Oral)   Ht 5\' 1"  (1.549 m)   SpO2 98%   BMI 35.14 kg/m   Wt Readings from Last 3 Encounters:  10/24/18 186 lb (84.4 kg)  10/16/18 186 lb 9.6 oz (84.6 kg)  10/07/18 182 lb 4.8 oz (82.7 kg)    Physical Exam Vitals signs and nursing note reviewed.  Constitutional:      Appearance: Normal appearance.  HENT:     Head: Atraumatic.     Right Ear: Tympanic membrane and external ear normal.     Left Ear: Tympanic membrane and external ear normal.     Nose: Rhinorrhea present. No congestion.     Mouth/Throat:     Mouth: Mucous membranes are moist.     Pharynx: Posterior oropharyngeal erythema present.  Eyes:     Extraocular Movements: Extraocular movements intact.     Conjunctiva/sclera: Conjunctivae normal.  Neck:     Musculoskeletal: Normal range of motion and neck supple.  Cardiovascular:     Rate and Rhythm: Normal rate and regular rhythm.     Heart sounds: Normal  heart sounds.  Pulmonary:     Effort: Pulmonary effort is normal.     Breath sounds: Normal breath sounds. No wheezing or rales.  Musculoskeletal: Normal range of motion.  Skin:    General: Skin is warm and dry.  Neurological:     Mental Status: She is alert and oriented to person, place, and time.  Psychiatric:        Mood and Affect: Mood normal.        Thought Content: Thought content normal.     Results for orders placed or performed in visit on 10/30/18  Rapid Strep Screen ([facility] ONLY)  Result Value Ref Range   Strep Gp A Ag, IA W/Reflex Negative Negative  Culture, Group A Strep  Result Value Ref Range   Strep A Culture Negative   Veritor Flu A/B Waived  Result Value Ref Range   Influenza A Negative Negative   Influenza B Negative Negative      Assessment & Plan:   Problem List Items Addressed This Visit    None    Visit Diagnoses    Viral URI with cough    -  Primary  Rapid flu and strep neg, but low risk and therefore does not qualify for COVID testing. Recommend supportive care, self quarantine x 2 weeks   Fever, unspecified fever cause       Relevant Orders   Veritor Flu A/B Waived (Completed)   Rapid Strep Screen ([facility] ONLY) (Completed)       Follow up plan: Return if symptoms worsen or fail to improve.

## 2018-11-01 LAB — VERITOR FLU A/B WAIVED
Influenza A: NEGATIVE
Influenza B: NEGATIVE

## 2018-11-01 LAB — CULTURE, GROUP A STREP: Strep A Culture: NEGATIVE

## 2018-11-01 LAB — RAPID STREP SCREEN (MED CTR MEBANE ONLY): Strep Gp A Ag, IA W/Reflex: NEGATIVE

## 2018-11-04 ENCOUNTER — Telehealth: Payer: Self-pay | Admitting: Family Medicine

## 2018-11-04 NOTE — Telephone Encounter (Signed)
Called pt,  VM box full. She is currently out on self quarantine since last week and wanted to call and check in on her. Please see how she's doing and offer virtual visit if needed

## 2018-11-04 NOTE — Telephone Encounter (Signed)
Called and spoke to patient. She states that she is a little better. No fever, still has a cough and sore throat. Patient states she is going to have to have a note to go back to work. Fleet Contras, do you want to do a telephone visit or video visit with her for that?

## 2018-11-05 NOTE — Telephone Encounter (Signed)
Yes, will need visit preferably video so I can assess to a greater extent. Please get her scheduled

## 2018-11-05 NOTE — Telephone Encounter (Signed)
Called pt unable to lvm. Will try again.

## 2018-11-06 ENCOUNTER — Other Ambulatory Visit: Payer: Self-pay

## 2018-11-06 ENCOUNTER — Encounter: Payer: Self-pay | Admitting: Family Medicine

## 2018-11-06 DIAGNOSIS — R6889 Other general symptoms and signs: Secondary | ICD-10-CM | POA: Diagnosis not present

## 2018-11-06 DIAGNOSIS — J069 Acute upper respiratory infection, unspecified: Secondary | ICD-10-CM | POA: Diagnosis not present

## 2018-11-06 DIAGNOSIS — J029 Acute pharyngitis, unspecified: Secondary | ICD-10-CM | POA: Diagnosis not present

## 2018-11-06 NOTE — Telephone Encounter (Signed)
Patient scheduled to [facility] w/ Dr. Laural Benes @ 1 p.m. today.

## 2018-11-07 NOTE — Progress Notes (Signed)
This encounter was created in error - please disregard.

## 2018-11-10 ENCOUNTER — Ambulatory Visit: Payer: BLUE CROSS/BLUE SHIELD | Admitting: Family Medicine

## 2018-11-10 ENCOUNTER — Other Ambulatory Visit: Payer: Self-pay | Admitting: Neurology

## 2018-11-11 DIAGNOSIS — G4733 Obstructive sleep apnea (adult) (pediatric): Secondary | ICD-10-CM | POA: Diagnosis not present

## 2018-11-12 ENCOUNTER — Encounter: Payer: Self-pay | Admitting: Family Medicine

## 2018-11-12 ENCOUNTER — Ambulatory Visit (INDEPENDENT_AMBULATORY_CARE_PROVIDER_SITE_OTHER): Payer: BLUE CROSS/BLUE SHIELD | Admitting: Family Medicine

## 2018-11-12 ENCOUNTER — Other Ambulatory Visit: Payer: Self-pay

## 2018-11-12 VITALS — BP 123/89 | HR 93 | Temp 100.3°F | Wt 185.0 lb

## 2018-11-12 DIAGNOSIS — R197 Diarrhea, unspecified: Secondary | ICD-10-CM | POA: Diagnosis not present

## 2018-11-12 DIAGNOSIS — J069 Acute upper respiratory infection, unspecified: Secondary | ICD-10-CM

## 2018-11-12 NOTE — Progress Notes (Signed)
BP 123/89   Pulse 93   Temp 100.3 F (37.9 C) (Oral)   Wt 185 lb (83.9 kg)   BMI 34.96 kg/m    Subjective:    Patient ID: Pamela Avila, female    DOB: [birth year], 48 y.o.   MRN: [id number]  HPI: Pamela Avila is a 48 y.o. female  Chief Complaint  Patient presents with  . Follow-up    Work note to return    . This visit was completed via WebEx due to the restrictions of the COVID-19 pandemic. All issues as above were discussed and addressed. Physical exam was done as above through visual confirmation on WebEx. If it was felt that the patient should be evaluated in the office, they were directed there. The patient verbally consented to this visit. . Location of the patient: home . Location of the provider: home . Those involved with this call:  . Provider: Roosvelt Maser, PA-C . CMA: Sheilah Mins, CMA . Front Desk/Registration: Harriet Pho  . Time spent on call: 15 minutes with patient face to face via video conference. More than 50% of this time was spent in counseling and coordination of care.   Here today following up for 2 week quarantine clearance. Currently taking doxycycline she received from [facility] almost a week ago. Highest temp over the past week is today at 100.3. Still having some mild headaches, fatigue, sore throat, cough but nothing as bad as what she was having the two weeks prior. Does have some diarrhea and abdominal cramping she thinks the doxycycline is causing. Denies CP, sweats, body aches, SOB, urinary sxs at this time.   Relevant past medical, surgical, family and social history reviewed and updated as indicated. Interim medical history since our last visit reviewed. Allergies and medications reviewed and updated.  Review of Systems  Per HPI unless specifically indicated above     Objective:    BP 123/89   Pulse 93   Temp 100.3 F (37.9 C) (Oral)   Wt 185 lb (83.9 kg)   BMI 34.96 kg/m   Wt Readings from Last 3 Encounters:  11/12/18 185 lb (83.9 kg)   11/06/18 185 lb (83.9 kg)  10/24/18 186 lb (84.4 kg)    Physical Exam Vitals signs and nursing note reviewed.  Constitutional:      General: She is not in acute distress.    Appearance: Normal appearance.  HENT:     Head: Atraumatic.     Right Ear: External ear normal.     Left Ear: External ear normal.     Nose: Nose normal. No congestion.     Mouth/Throat:     Mouth: Mucous membranes are moist.     Pharynx: Posterior oropharyngeal erythema present.  Eyes:     Extraocular Movements: Extraocular movements intact.     Conjunctiva/sclera: Conjunctivae normal.  Neck:     Musculoskeletal: Normal range of motion.  Cardiovascular:     Comments: Unable to assess via virtual visit Pulmonary:     Effort: Pulmonary effort is normal. No respiratory distress.  Abdominal:     General: There is no distension.     Comments: Had pt palpate abdomen diffusely, she reports no tenderness and I observed no signs of discomfort during her self exam  Musculoskeletal: Normal range of motion.  Skin:    General: Skin is dry.     Findings: No erythema.  Neurological:     Mental Status: She is alert and oriented to person, place,  and time.  Psychiatric:        Mood and Affect: Mood normal.        Thought Content: Thought content normal.        Judgment: Judgment normal.     Results for orders placed or performed in visit on 10/30/18  Rapid Strep Screen ([facility] ONLY)  Result Value Ref Range   Strep Gp A Ag, IA W/Reflex Negative Negative  Culture, Group A Strep  Result Value Ref Range   Strep A Culture Negative   Veritor Flu A/B Waived  Result Value Ref Range   Influenza A Negative Negative   Influenza B Negative Negative      Assessment & Plan:   Problem List Items Addressed This Visit    None    Visit Diagnoses    Upper respiratory tract infection, unspecified type    -  Primary   Diarrhea, unspecified type        Still not feeling 100% and has low grade fever today, will  extend work note through Monday, April 6th and reassess at that point. Continue rest, supportive care, good fluid intake. Strict return precautions given. Suspect GI sxs are related to doxycycline, will monitor for resolution once this is complete. BRAT diet.    Follow up plan: Return in about 5 days (around 11/17/2018) for URI f/u.

## 2018-11-15 ENCOUNTER — Other Ambulatory Visit: Payer: Self-pay | Admitting: Family Medicine

## 2018-11-17 ENCOUNTER — Ambulatory Visit (INDEPENDENT_AMBULATORY_CARE_PROVIDER_SITE_OTHER): Payer: BLUE CROSS/BLUE SHIELD | Admitting: Family Medicine

## 2018-11-17 ENCOUNTER — Encounter: Payer: Self-pay | Admitting: Family Medicine

## 2018-11-17 ENCOUNTER — Other Ambulatory Visit: Payer: Self-pay

## 2018-11-17 VITALS — BP 138/92 | HR 97 | Temp 100.5°F | Wt 188.0 lb

## 2018-11-17 DIAGNOSIS — J069 Acute upper respiratory infection, unspecified: Secondary | ICD-10-CM

## 2018-11-17 DIAGNOSIS — Z20828 Contact with and (suspected) exposure to other viral communicable diseases: Secondary | ICD-10-CM | POA: Diagnosis not present

## 2018-11-17 MED ORDER — ALBUTEROL SULFATE HFA 108 (90 BASE) MCG/ACT IN AERS: 2.0000 | INHALATION_SPRAY | Freq: Four times a day (QID) | RESPIRATORY_TRACT | 3 refills | Status: DC | PRN

## 2018-11-17 NOTE — Assessment & Plan Note (Signed)
Very likely COVID. We are not testing, but given exposures, fever, cough and SOB she will remain in self-quarantine until completely asymptomatic for >72 hours. Will check back in in 1 week to see how she's doing. Albuterol sent to her pharmacy for help with breathing. Call with any concerns.

## 2018-11-17 NOTE — Progress Notes (Signed)
BP (!) 138/92 Comment: Patient reported  Pulse 97   Temp (!) 100.5 F (38.1 C) Comment: Patient reported  Wt 188 lb (85.3 kg) Comment: Patient reported  SpO2 99% Comment: Patient reported  BMI 35.52 kg/m    Subjective:    Patient ID: Pamela Avila, female    DOB: June 23, 1971, 48 y.o.   MRN: [id number]  HPI: Pamela Avila is a 48 y.o. female  Chief Complaint  Patient presents with  . Follow-up URI  . TELEMEDICINE VISIT   Calling to check in on Pamela Avila  UPPER RESPIRATORY TRACT INFECTION- has had several exposures, several people at work have been sick. Not feeling that bad besides the fever. Still coughing still has a fever. Still has a headache Duration: 2.5 weeks Worst symptom: fever Fever: yes Cough: yes Shortness of breath: no Wheezing: no Chest pain: yes, with cough Chest tightness: yes Chest congestion: yes Nasal congestion: no Runny nose: no Post nasal drip: no Sneezing: no Sore throat: yes Swollen glands: no Sinus pressure: no Headache: yes Face pain: no Toothache: no Ear pain: no  Ear pressure: no  Eyes red/itching:no Eye drainage/crusting: no  Vomiting: no Rash: no Fatigue: yes Sick contacts: yes Strep contacts: no  Context: better Recurrent sinusitis: no Relief with OTC cold/cough medications: no  Treatments attempted: cough syrup  Relevant past medical, surgical, family and social history reviewed and updated as indicated. Interim medical history since our last visit reviewed. Allergies and medications reviewed and updated.  Review of Systems  Constitutional: Positive for fatigue and fever. Negative for activity change, appetite change, chills, diaphoresis and unexpected weight change.  HENT: Positive for sore throat. Negative for congestion, dental problem, drooling, ear discharge, ear pain, facial swelling, hearing loss, mouth sores, nosebleeds, postnasal drip, rhinorrhea, sinus pressure, sinus pain, sneezing, tinnitus, trouble  swallowing and voice change.   Eyes: Negative.   Respiratory: Negative.   Cardiovascular: Negative.   Neurological: Negative.   Psychiatric/Behavioral: Negative.     Per HPI unless specifically indicated above     Objective:    BP (!) 138/92 Comment: Patient reported  Pulse 97   Temp (!) 100.5 F (38.1 C) Comment: Patient reported  Wt 188 lb (85.3 kg) Comment: Patient reported  SpO2 99% Comment: Patient reported  BMI 35.52 kg/m   Wt Readings from Last 3 Encounters:  11/17/18 188 lb (85.3 kg)  11/12/18 185 lb (83.9 kg)  11/06/18 185 lb (83.9 kg)    Physical Exam Vitals signs and nursing note reviewed.  Constitutional:      General: She is not in acute distress.    Appearance: Normal appearance. She is not ill-appearing, toxic-appearing or diaphoretic.  HENT:     Head: Normocephalic and atraumatic.     Right Ear: External ear normal.     Left Ear: External ear normal.     Nose: Nose normal.     Mouth/Throat:     Mouth: Mucous membranes are moist.     Pharynx: Oropharynx is clear.  Eyes:     General: No scleral icterus.       Right eye: No discharge.        Left eye: No discharge.     Conjunctiva/sclera: Conjunctivae normal.     Pupils: Pupils are equal, round, and reactive to light.  Neck:     Musculoskeletal: Normal range of motion.  Pulmonary:     Effort: Pulmonary effort is normal. No respiratory distress.     Comments: Speaking  in full sentences Musculoskeletal: Normal range of motion.  Skin:    Coloration: Skin is not jaundiced or pale.     Findings: No bruising, erythema, lesion or rash.  Neurological:     Mental Status: She is alert and oriented to person, place, and time. Mental status is at baseline.  Psychiatric:        Mood and Affect: Mood normal.        Behavior: Behavior normal.        Thought Content: Thought content normal.        Judgment: Judgment normal.     Results for orders placed or performed in visit on 10/30/18  Rapid Strep  Screen ([facility] ONLY)  Result Value Ref Range   Strep Gp A Ag, IA W/Reflex Negative Negative  Culture, Group A Strep  Result Value Ref Range   Strep A Culture Negative   Veritor Flu A/B Waived  Result Value Ref Range   Influenza A Negative Negative   Influenza B Negative Negative      Assessment & Plan:   Problem List Items Addressed This Visit      Respiratory   Upper respiratory tract infection - Primary    Very likely COVID. We are not testing, but given exposures, fever, cough and SOB she will remain in self-quarantine until completely asymptomatic for >72 hours. Will check back in in 1 week to see how she'Avila doing. Albuterol sent to her pharmacy for help with breathing. Call with any concerns.           Follow up plan: Return in about 1 week (around 11/24/2018) for follow up URI.    . This visit was completed via FaceTime due to the restrictions of the COVID-19 pandemic. All issues as above were discussed and addressed. Physical exam was done as above through visual confirmation on FaceTIme. If it was felt that the patient should be evaluated in the office, they were directed there. The patient verbally consented to this visit. . Location of the patient: home . Location of the provider: work . Those involved with this call:  . Provider: Olevia PerchesMegan , DO . CMA: Tiffany Reel, CMA . Front Desk/Registration: Adela Portshristan Williamson  . Time spent on call: 25 minutes with patient face to face via video conference. More than 50% of this time was spent in counseling and coordination of care.

## 2018-11-24 ENCOUNTER — Ambulatory Visit (INDEPENDENT_AMBULATORY_CARE_PROVIDER_SITE_OTHER): Payer: BLUE CROSS/BLUE SHIELD | Admitting: Family Medicine

## 2018-11-24 ENCOUNTER — Other Ambulatory Visit: Payer: Self-pay

## 2018-11-24 ENCOUNTER — Encounter: Payer: Self-pay | Admitting: Family Medicine

## 2018-11-24 VITALS — BP 125/85 | HR 93 | Temp 98.4°F | Wt 185.0 lb

## 2018-11-24 DIAGNOSIS — J069 Acute upper respiratory infection, unspecified: Secondary | ICD-10-CM | POA: Diagnosis not present

## 2018-11-24 NOTE — Assessment & Plan Note (Signed)
Resolved. No fevers in >72 hours without antipyretics. OK to return to work. Note given.

## 2018-11-24 NOTE — Progress Notes (Signed)
BP 125/85   Pulse 93   Temp 98.4 F (36.9 C) (Oral)   Wt 185 lb (83.9 kg)   SpO2 98%   BMI 34.96 kg/m    Subjective:    Patient ID: Pamela Avila, female    DOB: Jun 09, 1971, 48 y.o.   MRN: [id number]  HPI: Pamela Avila is a 48 y.o. female  Chief Complaint  Patient presents with  . Letter for School/Work    All symptoms resolved. Some clearing of throat - feels more allergy related.    Doing a lot better. No fevers since Friday. Not taking any tylenol or ibuprofen. No more cough except for dry throat from allergies. She is otherwise feeling well with no other concerns or complaints at this time.   Relevant past medical, surgical, family and social history reviewed and updated as indicated. Interim medical history since our last visit reviewed. Allergies and medications reviewed and updated.  Review of Systems  Constitutional: Negative.   HENT: Negative.   Eyes: Negative.   Respiratory: Negative.   Cardiovascular: Negative.   Musculoskeletal: Negative.   Neurological: Negative.   Psychiatric/Behavioral: Negative.     Per HPI unless specifically indicated above     Objective:    BP 125/85   Pulse 93   Temp 98.4 F (36.9 C) (Oral)   Wt 185 lb (83.9 kg)   SpO2 98%   BMI 34.96 kg/m   Wt Readings from Last 3 Encounters:  11/24/18 185 lb (83.9 kg)  11/17/18 188 lb (85.3 kg)  11/12/18 185 lb (83.9 kg)    Physical Exam Vitals signs and nursing note reviewed.  Constitutional:      General: She is not in acute distress.    Appearance: Normal appearance. She is not ill-appearing, toxic-appearing or diaphoretic.  HENT:     Head: Normocephalic and atraumatic.     Right Ear: External ear normal.     Left Ear: External ear normal.     Nose: Nose normal.     Mouth/Throat:     Mouth: Mucous membranes are moist.     Pharynx: Oropharynx is clear.  Eyes:     General: No scleral icterus.       Right eye: No discharge.        Left eye: No discharge.   Conjunctiva/sclera: Conjunctivae normal.     Pupils: Pupils are equal, round, and reactive to light.  Neck:     Musculoskeletal: Normal range of motion.  Pulmonary:     Effort: Pulmonary effort is normal. No respiratory distress.     Comments: Speaking in full sentences Musculoskeletal: Normal range of motion.  Skin:    Coloration: Skin is not jaundiced or pale.     Findings: No bruising, erythema, lesion or rash.  Neurological:     Mental Status: She is alert and oriented to person, place, and time. Mental status is at baseline.  Psychiatric:        Mood and Affect: Mood normal.        Behavior: Behavior normal.        Thought Content: Thought content normal.        Judgment: Judgment normal.     Results for orders placed or performed in visit on 10/30/18  Rapid Strep Screen ([facility] ONLY)  Result Value Ref Range   Strep Gp A Ag, IA W/Reflex Negative Negative  Culture, Group A Strep  Result Value Ref Range   Strep A Culture Negative  Veritor Flu A/B Waived  Result Value Ref Range   Influenza A Negative Negative   Influenza B Negative Negative      Assessment & Plan:   Problem List Items Addressed This Visit      Respiratory   Upper respiratory tract infection - Primary    Resolved. No fevers in >72 hours without antipyretics. OK to return to work. Note given.           Follow up plan: Return if symptoms worsen or fail to improve.   . This visit was completed via FaceTime due to the restrictions of the COVID-19 pandemic. All issues as above were discussed and addressed. Physical exam was done as above through visual confirmation on FaceTime. If it was felt that the patient should be evaluated in the office, they were directed there. The patient verbally consented to this visit. . Location of the patient: home . Location of the provider: home . Those involved with this call:  . Provider: Olevia PerchesMegan Lauriel Helin, DO . CMA: Sheilah MinsJada Fox, CMA . Front Desk/Registration:  Adela Portshristan Williamson  . Time spent on call: 10 minutes with patient face to face via video conference. More than 50% of this time was spent in counseling and coordination of care. 15 minutes total spent in review of patient's record and preparation of their chart.

## 2018-12-11 DIAGNOSIS — G4733 Obstructive sleep apnea (adult) (pediatric): Secondary | ICD-10-CM | POA: Diagnosis not present

## 2018-12-12 ENCOUNTER — Telehealth: Payer: Self-pay | Admitting: Family Medicine

## 2018-12-12 DIAGNOSIS — R5383 Other fatigue: Secondary | ICD-10-CM

## 2018-12-12 NOTE — Telephone Encounter (Signed)
Copied from CRM [id number]. Topic: Appointment Scheduling - Scheduling Inquiry for Clinic >> Dec 10, 2018  4:11 PM Maia Petties wrote: Reason for call: Pt states in February she was told to come in for thyroid lab work. No orders in system or notes regarding thyroid testing found. Christan also not able to find. Pt was advised we will review and call her back with further information on testing.

## 2018-12-12 NOTE — Telephone Encounter (Signed)
Labs scheduled  

## 2018-12-12 NOTE — Telephone Encounter (Signed)
She can come in whenever for labs. Orders in.

## 2018-12-13 ENCOUNTER — Other Ambulatory Visit: Payer: Self-pay | Admitting: Family Medicine

## 2018-12-15 ENCOUNTER — Other Ambulatory Visit: Payer: Self-pay

## 2018-12-15 ENCOUNTER — Other Ambulatory Visit: Payer: BLUE CROSS/BLUE SHIELD

## 2018-12-15 ENCOUNTER — Encounter: Payer: Self-pay | Admitting: Family Medicine

## 2018-12-15 DIAGNOSIS — Z289 Immunization not carried out for unspecified reason: Secondary | ICD-10-CM | POA: Diagnosis not present

## 2018-12-15 DIAGNOSIS — R5383 Other fatigue: Secondary | ICD-10-CM | POA: Diagnosis not present

## 2018-12-16 LAB — TSH: TSH: 1.22 u[IU]/mL (ref 0.450–4.500)

## 2018-12-16 LAB — SAR COV2 SEROLOGY (COVID19)AB(IGG),IA: SARS-CoV-2 Ab, IgG: NEGATIVE

## 2019-01-11 DIAGNOSIS — G4733 Obstructive sleep apnea (adult) (pediatric): Secondary | ICD-10-CM | POA: Diagnosis not present

## 2019-01-15 ENCOUNTER — Encounter: Payer: Self-pay | Admitting: Family Medicine

## 2019-01-30 ENCOUNTER — Telehealth: Payer: Self-pay

## 2019-01-30 ENCOUNTER — Ambulatory Visit (INDEPENDENT_AMBULATORY_CARE_PROVIDER_SITE_OTHER): Payer: BC Managed Care – PPO | Admitting: Family Medicine

## 2019-01-30 ENCOUNTER — Encounter: Payer: Self-pay | Admitting: Family Medicine

## 2019-01-30 ENCOUNTER — Other Ambulatory Visit: Payer: Self-pay

## 2019-01-30 ENCOUNTER — Telehealth: Payer: Self-pay | Admitting: Family Medicine

## 2019-01-30 VITALS — BP 118/89 | HR 110 | Temp 97.5°F | Ht 61.0 in | Wt 180.0 lb

## 2019-01-30 DIAGNOSIS — F419 Anxiety disorder, unspecified: Secondary | ICD-10-CM

## 2019-01-30 DIAGNOSIS — F901 Attention-deficit hyperactivity disorder, predominantly hyperactive type: Secondary | ICD-10-CM

## 2019-01-30 DIAGNOSIS — F32A Depression, unspecified: Secondary | ICD-10-CM

## 2019-01-30 DIAGNOSIS — Z20822 Contact with and (suspected) exposure to covid-19: Secondary | ICD-10-CM

## 2019-01-30 DIAGNOSIS — R6889 Other general symptoms and signs: Secondary | ICD-10-CM

## 2019-01-30 DIAGNOSIS — F329 Major depressive disorder, single episode, unspecified: Secondary | ICD-10-CM | POA: Diagnosis not present

## 2019-01-30 MED ORDER — AMPHETAMINE-DEXTROAMPHET ER 30 MG PO CP24: 30.0000 mg | ORAL_CAPSULE | ORAL | 0 refills | Status: DC

## 2019-01-30 MED ORDER — AMPHETAMINE-DEXTROAMPHET ER 5 MG PO CP24: 5.0000 mg | ORAL_CAPSULE | Freq: Every day | ORAL | 0 refills | Status: DC

## 2019-01-30 MED ORDER — ESCITALOPRAM OXALATE 20 MG PO TABS: 20.0000 mg | ORAL_TABLET | Freq: Every day | ORAL | 3 refills | Status: DC

## 2019-01-30 NOTE — Telephone Encounter (Signed)
Patient called and advised of the request for covid testing, she verbalized understanding. Appointment scheduled for Monday, 02/02/19 at 1430 at [facility], advised of location and to wear a mask for everyone in the vehicle, she verbalized understanding. Order placed.

## 2019-01-30 NOTE — Telephone Encounter (Signed)
Known close exposures x6 with confirmed positives. + sore throat, congestion. Needs testing.  Primary Visit Coverage  Payer Plan Sponsor Code Group Number Group Name  [place]  [id number] [employer] [place]  Primary Visit Coverage Subscriber  ID Name [facility] Address  [id number] Gieske,Amorette C [id number]     [place], [place] [address]

## 2019-01-30 NOTE — Telephone Encounter (Signed)
Known close exposures x6 with confirmed positives. + sore throat, congestion. Needs testing.  Primary Visit Coverage  Payer Plan Sponsor Code Group Number Group Name  [place] [employer] [place]  [id number] [employer] [place]  Primary Visit Coverage Subscriber  ID Name SSN Address  [id number] Pamela Avila, Pamela Avila [id number] [address]     [name], [name] [address]   Attempted to call patient when call dropped from agent- voice mail is full- can not leave message

## 2019-02-01 NOTE — Progress Notes (Signed)
BP 118/89 (BP Location: Right Arm, Patient Position: Sitting, Cuff Size: Normal)  Pulse (!) 110  Temp (!) 97.5 F (36.4 C) (Tympanic)  Ht 5\' 1"  (1.549 m)  Wt 180 lb (81.6 kg)  SpO2 98%  BMI 34.01 kg/m  Subjective:  Subjective  Patient ID: Pamela Avila, female DOB: [birth year], 48 y.o. MRN: [id number]  HPI:  Pamela Avila is a 48 y.o. female      Chief Complaint  Patient presents with  . ADHD    Follow-up  . COVID Exposure    Patient states she was exposed to someone who tested positive. 5 family members have tested positive as well.   . Depression  . Anxiety   Has been exposed to 5 family members and 1 co-worker who have tested positive for COVID-19. She has been quarantining, but notes that she has had some congestion and runny nose. No SOB. She is anxious and feels like she should be tested.  ADHD FOLLOW UP- not doing great, but she feels like this may be due to her mood  ADHD status: uncontrolled  Satisfied with current therapy: no  Medication compliance: excellent compliance  Controlled substance contract: yes  Previous psychiatry evaluation: no  Previous medications: yes   Taking meds on weekends/vacations: yes  Work/school performance: average  Difficulty sustaining attention/completing tasks: yes  Distracted by extraneous stimuli: yes  Does not listen when spoken to: no  Fidgets with hands or feet: no  Unable to stay in seat: no  Blurts out/interrupts others: no  ADHD Medication Side Effects: no  Decreased appetite: no  Headache: no  Sleeping disturbance pattern: no  Irritability: yes  Rebound effects (worse than baseline) off medication: no  Anxiousness: yes  Dizziness: no  Tics: no    ANXIETY/DEPRESSION- has not been doing well. Has been very short tempered and yelling. Very furstrated  Duration:exacerbated  Anxious mood: yes  Excessive worrying: yes  Irritability: yes  Sweating: no  Nausea: no  Palpitations:yes  Hyperventilation: no  Panic attacks:  yes  Agoraphobia: no  Obscessions/compulsions: yes  Depressed mood: yes  [facility] 01/30/2019 10/24/2018 04/25/2018 12/16/2017 11/12/2017  Decreased Interest 2 1 2 1 1   Down, Depressed, Hopeless 2 1 2 1 1   PHQ - 2 Score 4 2 4 2 2   Altered sleeping 3 1 2 2 1   Tired, decreased energy 2 1 2 2 1   Change in appetite 0 0 1 1 0  Feeling bad or failure about yourself  0 0 1 1 0  Trouble concentrating 2 1 1 1 2   Moving slowly or fidgety/restless 1 0 0 0 0  Suicidal thoughts 0 0 0 0 0  PHQ-9 Score 12 5 11 9 6   Difficult doing work/chores Very difficult Somewhat difficult Somewhat difficult Very difficult -  Anhedonia: no  Weight changes: no  Insomnia: yes hard to fall asleep  Hypersomnia: no  Fatigue/loss of energy: yes  Feelings of worthlessness: yes  Feelings of guilt: yes  Impaired concentration/indecisiveness: yes  Suicidal ideations: no  Crying spells: yes  Recent Stressors/Life Changes: yes  Relationship problems: yes  Family stress: yes  Financial stress: yes  Job stress: yes  Recent death/loss: no   Relevant past medical, surgical, family and social history reviewed and updated as indicated. Interim medical history since our last visit reviewed.  Allergies and medications reviewed and updated.   Review of Systems  Constitutional: Negative.  HENT: Positive for congestion, postnasal drip, rhinorrhea and sore throat.  Negative for dental problem, drooling, ear discharge, ear pain, facial swelling, hearing loss, mouth sores, nosebleeds, sinus pressure, sinus pain, sneezing, tinnitus, trouble swallowing and voice change.  Respiratory: Negative.  Cardiovascular: Negative.  Gastrointestinal: Negative.  Musculoskeletal: Negative.  Skin: Negative.  Neurological: Negative.  Psychiatric/Behavioral: Positive for dysphoric mood and sleep disturbance. Negative for agitation, behavioral problems, confusion, decreased concentration, hallucinations, self-injury and suicidal ideas.  The patient is nervous/anxious. The patient is not hyperactive.   Per HPI unless specifically indicated above     Objective  BP 118/89 (BP Location: Right Arm, Patient Position: Sitting, Cuff Size: Normal)  Pulse (!) 110  Temp (!) 97.5 F (36.4 C) (Tympanic)  Ht 5\' 1"  (1.549 m)  Wt 180 lb (81.6 kg)  SpO2 98%  BMI 34.01 kg/m     Wt Readings from Last 3 Encounters:  01/30/19 180 lb (81.6 kg)  11/24/18 185 lb (83.9 kg)  11/17/18 188 lb (85.3 kg)   Physical Exam  Vitals signs and nursing note reviewed.  Constitutional:  General: She is not in acute distress. Appearance: Normal appearance. She is not ill-appearing, toxic-appearing or diaphoretic.  HENT:  Head: Normocephalic and atraumatic.  Right Ear: External ear normal.  Left Ear: External ear normal.  Nose: Nose normal.  Mouth/Throat:  Mouth: Mucous membranes are moist.  Pharynx: Oropharynx is clear.  Eyes:  General: No scleral icterus.  Right eye: No discharge.  Left eye: No discharge.  Conjunctiva/sclera: Conjunctivae normal.  Pupils: Pupils are equal, round, and reactive to light.  Neck:  Musculoskeletal: Normal range of motion.  Pulmonary:  Effort: Pulmonary effort is normal. No respiratory distress.  Comments: Speaking in full sentences Musculoskeletal: Normal range of motion.  Skin:  Coloration: Skin is not jaundiced or pale.  Findings: No bruising, erythema, lesion or rash.  Neurological:  Mental Status: She is alert and oriented to person, place, and time. Mental status is at baseline.  Psychiatric:  Mood and Affect: Mood normal.  Behavior: Behavior normal.  Thought Content: Thought content normal.  Judgment: Judgment normal.       Results for orders placed or performed in visit on 12/15/18  SAR CoV2 Serology (COVID 19)AB(IGG)IA   Specimen: Blood  Result Value Ref Range   [place] SARS-CoV-2 Ab, IgG Negative Negative  TSH  Result Value Ref Range   TSH 1.220 0.450 - 4.500 uIU/mL   Assessment &  Plan:       Problem List Items Addressed This Visit    Problem List Items Addressed This Visit      Other   ADHD (attention deficit hyperactivity disorder)    Not under good control- likely due to her mood. Will work on mood and recheck 1 month. Call with any concerns.       Anxiety and depression    Not doing well. Will change from celexa to lexapro and recheck 2-3 weeks. Call with any concerns. Continue to monitor.       Relevant Medications   escitalopram (LEXAPRO) 20 MG tablet    Other Visit Diagnoses    Suspected Covid-19 Virus Infection    -  Primary   Will arrange testing. Await results. Continue self-quarantine until results back.          Follow up plan:  Return 2- 3 weeks, for follow up mood.    This visit was completed via FaceTime due to the restrictions of the COVID-19 pandemic. All issues as above were discussed and addressed. Physical exam was done as above through visual  confirmation on FaceTime. If it was felt that the patient should be evaluated in the office, they were directed there. The patient verbally consented to this visit. Location of the patient: home  Location of the provider: work  Those involved with this call:  Provider: Park Liter, DO  CMA: Merilyn Baba, [place] Desk/Registration: Don Perking  Time spent on call: 25 minutes with patient face to face via video conference. More than 50% of this time was spent in counseling and coordination of care. 40 minutes total spent in review of patient's record and preparation of their chart.

## 2019-02-01 NOTE — Assessment & Plan Note (Signed)
Not under good control- likely due to her mood. Will work on mood and recheck 1 month. Call with any concerns.

## 2019-02-01 NOTE — Assessment & Plan Note (Signed)
Not doing well. Will change from celexa to lexapro and recheck 2-3 weeks. Call with any concerns. Continue to monitor.

## 2019-02-02 ENCOUNTER — Other Ambulatory Visit: Payer: BLUE CROSS/BLUE SHIELD

## 2019-02-02 ENCOUNTER — Encounter (INDEPENDENT_AMBULATORY_CARE_PROVIDER_SITE_OTHER): Payer: Self-pay

## 2019-02-02 DIAGNOSIS — Z20822 Contact with and (suspected) exposure to covid-19: Secondary | ICD-10-CM

## 2019-02-04 ENCOUNTER — Encounter (INDEPENDENT_AMBULATORY_CARE_PROVIDER_SITE_OTHER): Payer: Self-pay

## 2019-02-05 LAB — NOVEL CORONAVIRUS, NAA: SARS-CoV-2, NAA: NOT DETECTED

## 2019-02-09 ENCOUNTER — Encounter (INDEPENDENT_AMBULATORY_CARE_PROVIDER_SITE_OTHER): Payer: Self-pay

## 2019-02-10 ENCOUNTER — Ambulatory Visit: Payer: BLUE CROSS/BLUE SHIELD | Admitting: Neurology

## 2019-02-10 DIAGNOSIS — G4733 Obstructive sleep apnea (adult) (pediatric): Secondary | ICD-10-CM | POA: Diagnosis not present

## 2019-02-12 ENCOUNTER — Telehealth (INDEPENDENT_AMBULATORY_CARE_PROVIDER_SITE_OTHER): Payer: Self-pay | Admitting: Family Medicine

## 2019-02-12 ENCOUNTER — Encounter: Payer: Self-pay | Admitting: Family Medicine

## 2019-02-12 DIAGNOSIS — G4733 Obstructive sleep apnea (adult) (pediatric): Secondary | ICD-10-CM

## 2019-02-12 DIAGNOSIS — Z9989 Dependence on other enabling machines and devices: Secondary | ICD-10-CM
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
## 2019-02-25 ENCOUNTER — Other Ambulatory Visit: Payer: Self-pay | Admitting: Family Medicine

## 2019-02-25 DIAGNOSIS — F639 Impulse disorder, unspecified: Secondary | ICD-10-CM | POA: Diagnosis not present

## 2019-02-26 ENCOUNTER — Encounter: Payer: Self-pay | Admitting: Family Medicine

## 2019-02-26 ENCOUNTER — Ambulatory Visit: Payer: BC Managed Care – PPO | Admitting: Family Medicine

## 2019-02-26 NOTE — Progress Notes (Deleted)
There were no vitals taken for this visit.   Subjective:    Patient ID: Pamela Avila, female    DOB: 23-Jun-1971, 48 y.o.   MRN: [id number]  HPI: Pamela Avila is a 48 y.o. female  No chief complaint on file.  ANXIETY/DEPRESSION Duration:{Blank single:19197::"controlled","uncontrolled","better","worse","exacerbated","stable"} Anxious mood: {Blank single:19197::"yes","no"}  Excessive worrying: {Blank single:19197::"yes","no"} Irritability: {Blank single:19197::"yes","no"}  Sweating: {Blank single:19197::"yes","no"} Nausea: {Blank single:19197::"yes","no"} Palpitations:{Blank single:19197::"yes","no"} Hyperventilation: {Blank single:19197::"yes","no"} Panic attacks: {Blank single:19197::"yes","no"} Agoraphobia: {Blank single:19197::"yes","no"}  Obscessions/compulsions: {Blank single:19197::"yes","no"} Depressed mood: {Blank single:19197::"yes","no"} Depression screen [facility] 2/9 01/30/2019 10/24/2018 04/25/2018 12/16/2017 11/12/2017  Decreased Interest 2 1 2 1 1   Down, Depressed, Hopeless 2 1 2 1 1   PHQ - 2 Score 4 2 4 2 2   Altered sleeping 3 1 2 2 1   Tired, decreased energy 2 1 2 2 1   Change in appetite 0 0 1 1 0  Feeling bad or failure about yourself  0 0 1 1 0  Trouble concentrating 2 1 1 1 2   Moving slowly or fidgety/restless 1 0 0 0 0  Suicidal thoughts 0 0 0 0 0  PHQ-9 Score 12 5 11 9 6   Difficult doing work/chores Very difficult Somewhat difficult Somewhat difficult Very difficult -   Anhedonia: {Blank single:19197::"yes","no"} Weight changes: {Blank single:19197::"yes","no"} Insomnia: {Blank single:19197::"yes","no"} {Blank single:19197::"hard to fall asleep","hard to stay asleep"}  Hypersomnia: {Blank single:19197::"yes","no"} Fatigue/loss of energy: {Blank single:19197::"yes","no"} Feelings of worthlessness: {Blank single:19197::"yes","no"} Feelings of guilt: {Blank single:19197::"yes","no"} Impaired concentration/indecisiveness: {Blank single:19197::"yes","no"} Suicidal  ideations: {Blank single:19197::"yes","no"}  Crying spells: {Blank single:19197::"yes","no"} Recent Stressors/Life Changes: {Blank single:19197::"yes","no"}   Relationship problems: {Blank single:19197::"yes","no"}   Family stress: {Blank single:19197::"yes","no"}     Financial stress: {Blank single:19197::"yes","no"}    Job stress: {Blank single:19197::"yes","no"}    Recent death/loss: {Blank single:19197::"yes","no"}  ADHD FOLLOW UP ADHD status: {Blank single:19197::"controlled","uncontrolled","better","worse","exacerbated","stable"} Satisfied with current therapy: {Blank single:19197::"yes","no"} Medication compliance:  {Blank single:19197::"excellent compliance","good compliance","fair compliance","poor compliance"} Controlled substance contract: {Blank single:19197::"yes","no"} Previous psychiatry evaluation: {Blank single:19197::"yes","no"} Previous medications: {Blank single:19197::"yes","no"} {Blank multiple:19196::"adderall","adderall XR","concerta","daytrana (methylphenidate)","focalin (dexamethylphenidate)". "ritalin","ritalin LA","ritalin SR","stratera (atomoxetine)","vyvanse (lisdexamfethamine)","wellbutrin"}   Taking meds on weekends/vacations: {Blank single:19197::"yes","no","occasionally"} Work/school performance:  {Blank single:19197::"excellent","good","average","fair","poor"} Difficulty sustaining attention/completing tasks: {Blank single:19197::"yes","no"} Distracted by extraneous stimuli: {Blank single:19197::"yes","no"} Does not listen when spoken to: {Blank single:19197::"yes","no"}  Fidgets with hands or feet: {Blank single:19197::"yes","no"} Unable to stay in seat: {Blank single:19197::"yes","no"} Blurts out/interrupts others: {Blank single:19197::"yes","no"} ADHD Medication Side Effects: {Blank single:19197::"yes","no"}    Decreased appetite: {Blank single:19197::"yes","no"}    Headache: {Blank single:19197::"yes","no"}    Sleeping disturbance pattern: {Blank  single:19197::"yes","no"}    Irritability: {Blank single:19197::"yes","no"}    Rebound effects (worse than baseline) off medication: {Blank single:19197::"yes","no"}    Anxiousness: {Blank single:19197::"yes","no"}    Dizziness: {Blank single:19197::"yes","no"}    Tics: {Blank single:19197::"yes","no"}  Relevant past medical, surgical, family and social history reviewed and updated as indicated. Interim medical history since our last visit reviewed. Allergies and medications reviewed and updated.  Review of Systems  Per HPI unless specifically indicated above     Objective:    There were no vitals taken for this visit.  Wt Readings from Last 3 Encounters:  01/30/19 180 lb (81.6 kg)  11/24/18 185 lb (83.9 kg)  11/17/18 188 lb (85.3 kg)    Physical Exam  Results for orders placed or performed in visit on 02/02/19  Novel Coronavirus, NAA ([facility])  Result Value Ref Range   SARS-CoV-2, NAA Not Detected Not Detected      Assessment & Plan:   Problem List Items Addressed This Visit  Other   ADHD (attention deficit hyperactivity disorder) - Primary   Anxiety and depression       Follow up plan: No follow-ups on file.

## 2019-03-10 ENCOUNTER — Other Ambulatory Visit: Payer: Self-pay | Admitting: Family Medicine

## 2019-03-12 ENCOUNTER — Encounter: Payer: Self-pay | Admitting: Family Medicine

## 2019-03-12 ENCOUNTER — Ambulatory Visit (INDEPENDENT_AMBULATORY_CARE_PROVIDER_SITE_OTHER): Payer: BC Managed Care – PPO | Admitting: Family Medicine

## 2019-03-12 ENCOUNTER — Other Ambulatory Visit: Payer: Self-pay

## 2019-03-12 DIAGNOSIS — F901 Attention-deficit hyperactivity disorder, predominantly hyperactive type: Secondary | ICD-10-CM | POA: Diagnosis not present

## 2019-03-12 DIAGNOSIS — F329 Major depressive disorder, single episode, unspecified: Secondary | ICD-10-CM | POA: Diagnosis not present

## 2019-03-12 DIAGNOSIS — F32A Depression, unspecified: Secondary | ICD-10-CM

## 2019-03-12 DIAGNOSIS — F419 Anxiety disorder, unspecified: Secondary | ICD-10-CM

## 2019-03-12 MED ORDER — AMPHETAMINE-DEXTROAMPHET ER 5 MG PO CP24: 5.0000 mg | ORAL_CAPSULE | Freq: Every day | ORAL | 0 refills | Status: DC

## 2019-03-12 MED ORDER — ESCITALOPRAM OXALATE 20 MG PO TABS: 20.0000 mg | ORAL_TABLET | Freq: Every day | ORAL | 1 refills | Status: DC

## 2019-03-12 MED ORDER — AMPHETAMINE-DEXTROAMPHET ER 30 MG PO CP24: 30.0000 mg | ORAL_CAPSULE | ORAL | 0 refills | Status: DC

## 2019-03-12 NOTE — Assessment & Plan Note (Signed)
Under good control on current regimen. Continue current regimen. Continue to monitor. Call with any concerns. Refills given for 3 months. Follow up in 3 months.   

## 2019-03-12 NOTE — Assessment & Plan Note (Signed)
Under good control on current regimen. Continue current regimen. Continue to monitor. Call with any concerns. Refills given.   

## 2019-03-12 NOTE — Progress Notes (Signed)
BP 111/79   Pulse (!) 104   Temp 98.4 F (36.9 C)   Wt 180 lb (81.6 kg)   BMI 34.01 kg/m    Subjective:    Patient ID: Pamela Avila, female    DOB: [birth year], 48 y.o.   MRN: [id number]  HPI: Pamela Avila is a 48 y.o. female  Chief Complaint  Patient presents with  . Depression  . Anxiety   ANXIETY/DEPRESSION Duration:better Anxious mood: yes  Excessive worrying: yes Irritability: no  Sweating: no Nausea: no Palpitations:no Hyperventilation: no Panic attacks: no Agoraphobia: no  Obscessions/compulsions: no Depressed mood: yes Depression screen [facility] 2/9 03/12/2019 01/30/2019 10/24/2018 04/25/2018 12/16/2017  Decreased Interest 1 2 1 2 1   Down, Depressed, Hopeless 1 2 1 2 1   PHQ - 2 Score 2 4 2 4 2   Altered sleeping 1 3 1 2 2   Tired, decreased energy 1 2 1 2 2   Change in appetite 0 0 0 1 1  Feeling bad or failure about yourself  1 0 0 1 1  Trouble concentrating 2 2 1 1 1   Moving slowly or fidgety/restless 0 1 0 0 0  Suicidal thoughts 0 0 0 0 0  PHQ-9 Score 7 12 5 11 9   Difficult doing work/chores Somewhat difficult Very difficult Somewhat difficult Somewhat difficult Very difficult  Some recent data might be hidden   GAD 7 : Generalized Anxiety Score 03/12/2019 01/30/2019 10/24/2018 04/25/2018  Nervous, Anxious, on Edge 1 2 1 1   Control/stop worrying 0 1 0 0  Worry too much - different things 1 0 0 0  Trouble relaxing 0 2 1 2   Restless 1 1 0 0  Easily annoyed or irritable 1 3 1 2   Afraid - awful might happen 1 0 0 0  Total GAD 7 Score 5 9 3 5   Anxiety Difficulty Somewhat difficult Very difficult Somewhat difficult Somewhat difficult   Anhedonia: no Weight changes: no Insomnia: no   Hypersomnia: no Fatigue/loss of energy: yes Feelings of worthlessness: no Feelings of guilt: no Impaired concentration/indecisiveness: yes Suicidal ideations: no  Crying spells: no Recent Stressors/Life Changes: yes   Relationship problems: yes   Family stress: yes     Financial  stress: yes    Job stress: yes    Recent death/loss: no  ADHD FOLLOW UP ADHD status: stable Satisfied with current therapy: yes Medication compliance:  excellent compliance Controlled substance contract: yes Previous psychiatry evaluation: no Previous medications: yes adderall, adderall XR, concerta and stratera (atomoxetine)   Taking meds on weekends/vacations: yes Work/school performance:  excellent Difficulty sustaining attention/completing tasks: no Distracted by extraneous stimuli: no Does not listen when spoken to: no  Fidgets with hands or feet: no Unable to stay in seat: no Blurts out/interrupts others: no ADHD Medication Side Effects: no    Decreased appetite: no    Headache: no    Sleeping disturbance pattern: no    Irritability: no    Rebound effects (worse than baseline) off medication: no    Anxiousness: no    Dizziness: no    Tics: no   Relevant past medical, surgical, family and social history reviewed and updated as indicated. Interim medical history since our last visit reviewed. Allergies and medications reviewed and updated.  Review of Systems  Constitutional: Negative.   Respiratory: Negative.   Cardiovascular: Negative.   Skin: Negative.   Neurological: Negative.   Psychiatric/Behavioral: Positive for dysphoric mood. Negative for agitation, behavioral problems, confusion, decreased concentration, hallucinations, self-injury, sleep  disturbance and suicidal ideas. The patient is nervous/anxious. The patient is not hyperactive.     Per HPI unless specifically indicated above     Objective:    BP 111/79   Pulse (!) 104   Temp 98.4 F (36.9 C)   Wt 180 lb (81.6 kg)   BMI 34.01 kg/m   Wt Readings from Last 3 Encounters:  03/12/19 180 lb (81.6 kg)  01/30/19 180 lb (81.6 kg)  11/24/18 185 lb (83.9 kg)    Physical Exam Vitals signs and nursing note reviewed.  Constitutional:      General: She is not in acute distress.    Appearance: Normal  appearance. She is not ill-appearing, toxic-appearing or diaphoretic.  HENT:     Head: Normocephalic and atraumatic.     Right Ear: External ear normal.     Left Ear: External ear normal.     Nose: Nose normal.     Mouth/Throat:     Mouth: Mucous membranes are moist.     Pharynx: Oropharynx is clear.  Eyes:     General: No scleral icterus.       Right eye: No discharge.        Left eye: No discharge.     Conjunctiva/sclera: Conjunctivae normal.     Pupils: Pupils are equal, round, and reactive to light.  Neck:     Musculoskeletal: Normal range of motion.  Pulmonary:     Effort: Pulmonary effort is normal. No respiratory distress.     Comments: Speaking in full sentences Musculoskeletal: Normal range of motion.  Skin:    Coloration: Skin is not jaundiced or pale.     Findings: No bruising, erythema, lesion or rash.  Neurological:     Mental Status: She is alert and oriented to person, place, and time. Mental status is at baseline.  Psychiatric:        Mood and Affect: Mood normal.        Behavior: Behavior normal.        Thought Content: Thought content normal.        Judgment: Judgment normal.     Results for orders placed or performed in visit on 02/02/19  Novel Coronavirus, NAA ([facility])  Result Value Ref Range   SARS-CoV-2, NAA Not Detected Not Detected      Assessment & Plan:   Problem List Items Addressed This Visit      Other   ADHD (attention deficit hyperactivity disorder)    Under good control on current regimen. Continue current regimen. Continue to monitor. Call with any concerns. Refills given for 3 months. Follow up in 3 months.         Anxiety and depression    Under good control on current regimen. Continue current regimen. Continue to monitor. Call with any concerns. Refills given.        Relevant Medications   escitalopram (LEXAPRO) 20 MG tablet       Follow up plan: Return in about 3 months (around 06/12/2019) for Follow up ADD/mood.    . This visit was completed via FaceTime due to the restrictions of the COVID-19 pandemic. All issues as above were discussed and addressed. Physical exam was done as above through visual confirmation on FaceTime. If it was felt that the patient should be evaluated in the office, they were directed there. The patient verbally consented to this visit. . Location of the patient: home . Location of the provider: work . Those involved with this call:  .  Provider: Olevia PerchesMegan Henry Demeritt, DO . CMA: Tiffany Reel, CMA . Front Desk/Registration: Harriet PhoJoliza Dorrie Cocuzza  . Time spent on call: 25 minutes with patient face to face via video conference. More than 50% of this time was spent in counseling and coordination of care. 40 minutes total spent in review of patient's record and preparation of their chart.

## 2019-03-13 DIAGNOSIS — G4733 Obstructive sleep apnea (adult) (pediatric): Secondary | ICD-10-CM | POA: Diagnosis not present

## 2019-03-17 DIAGNOSIS — F639 Impulse disorder, unspecified: Secondary | ICD-10-CM | POA: Diagnosis not present

## 2019-03-21 IMAGING — MG DIGITAL SCREENING BILATERAL MAMMOGRAM WITH TOMO AND CAD
6 of 10 series · 6 of 30 positions shown · non-contrast
Comparison: Previous exam(s).

CLINICAL DATA: Screening.

EXAM:
DIGITAL SCREENING BILATERAL MAMMOGRAM WITH TOMO AND CAD

[L CC synth-2D]
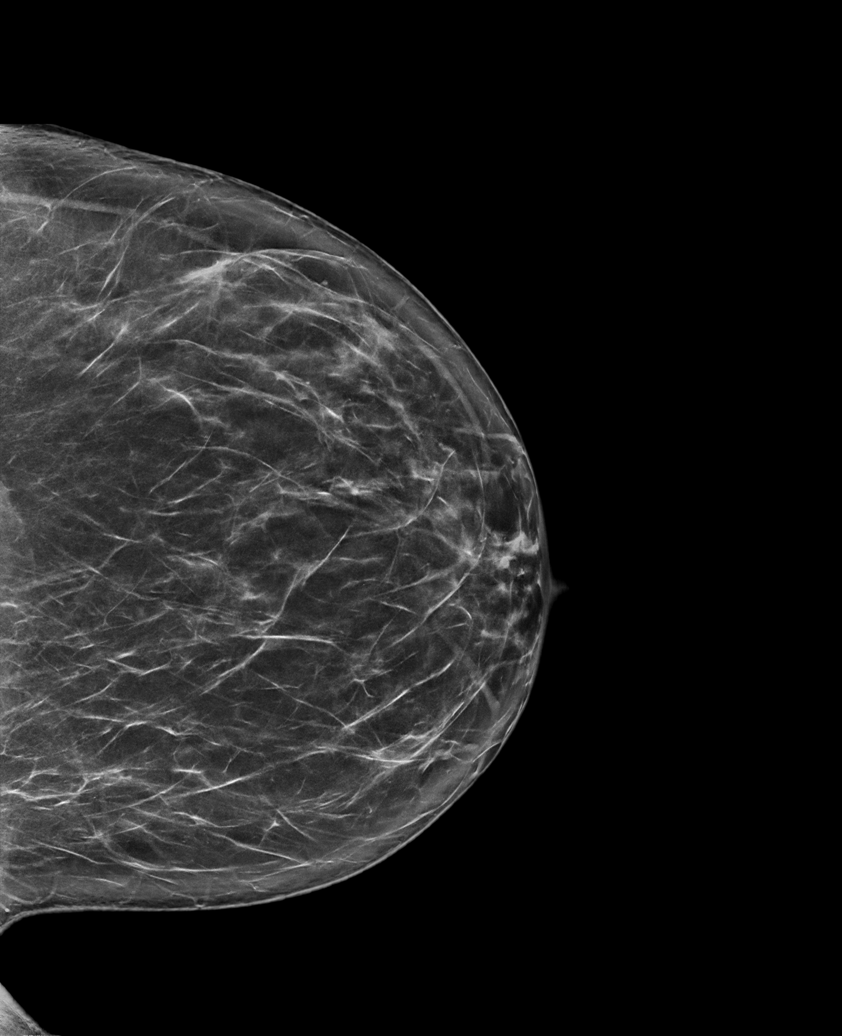

[R MLO synth-2D (1 of 2)]
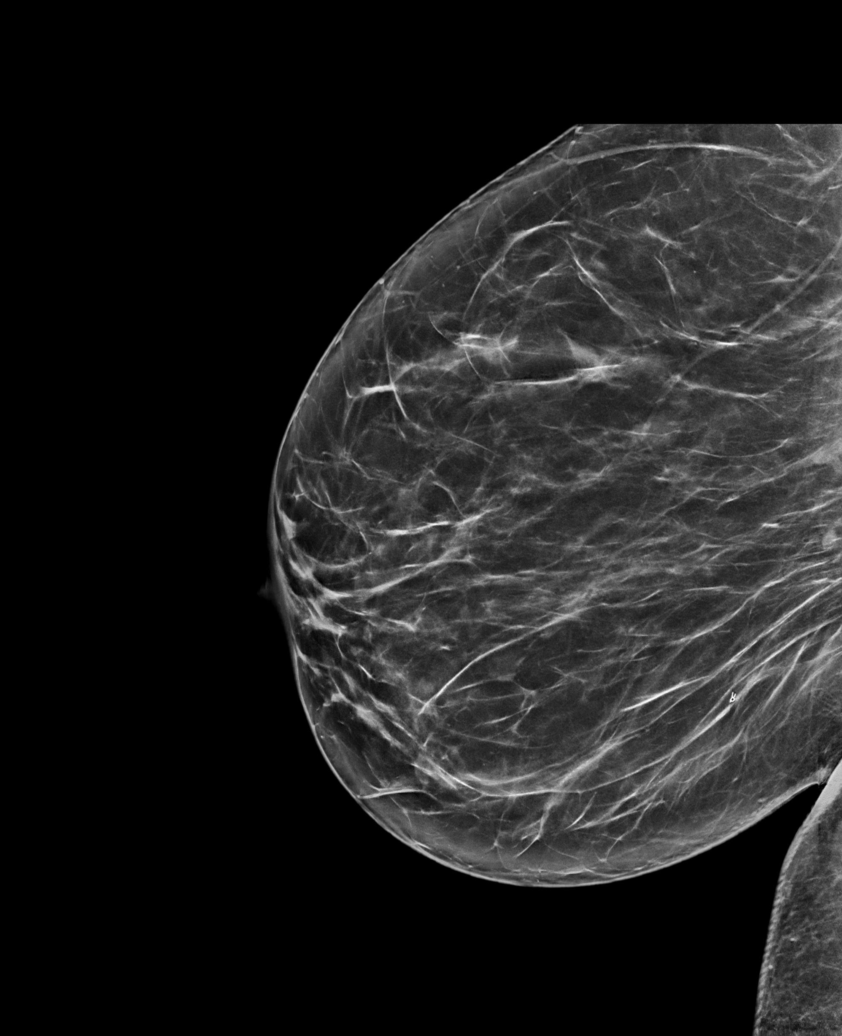

[R CC synth-2D]
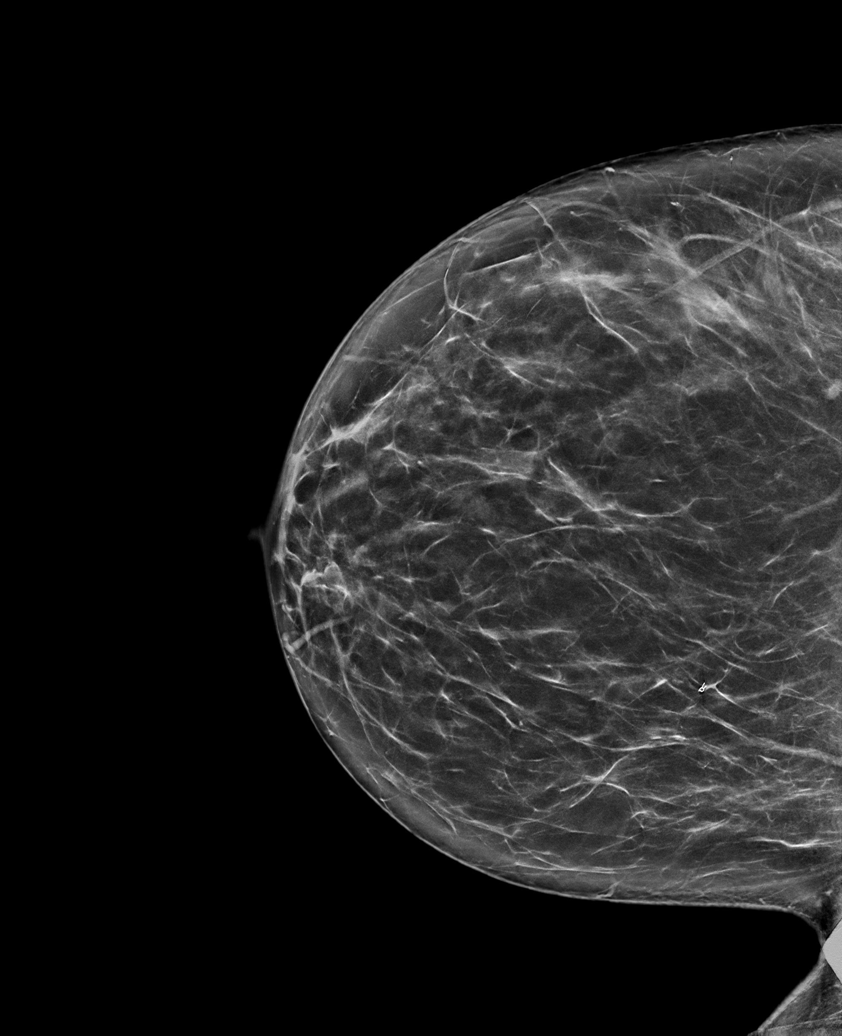

[L MLO synth-2D]
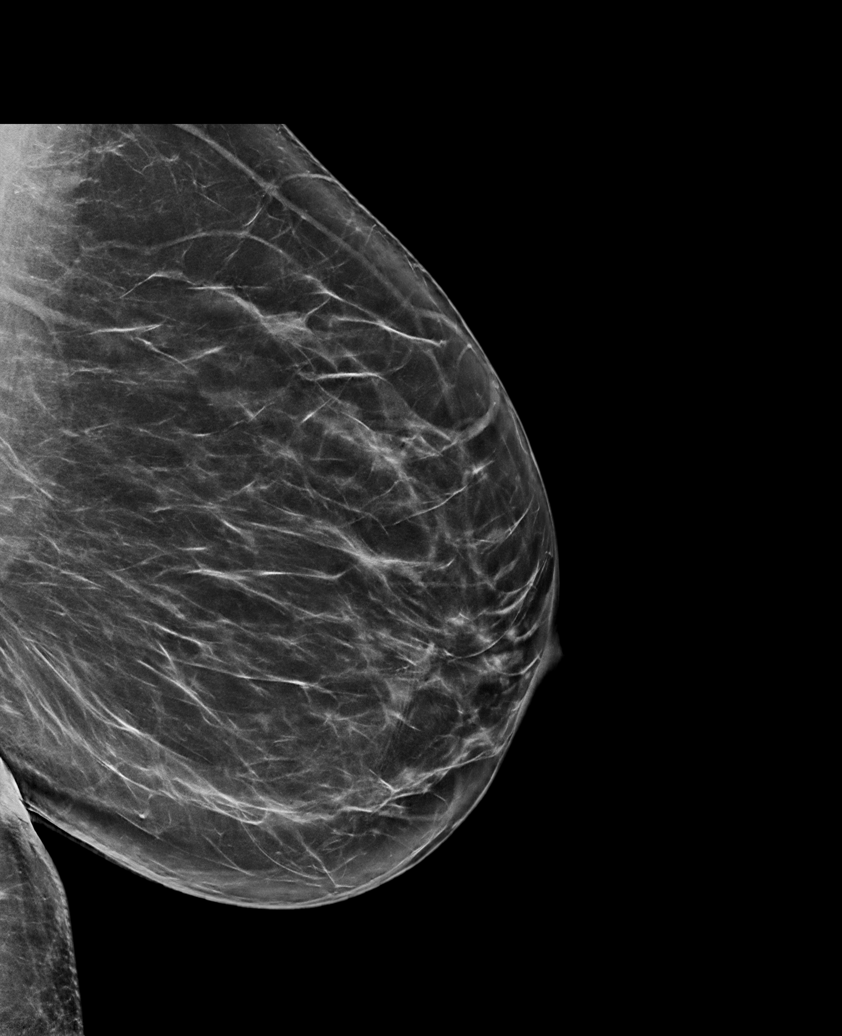

[R MLO synth-2D (2 of 2)]
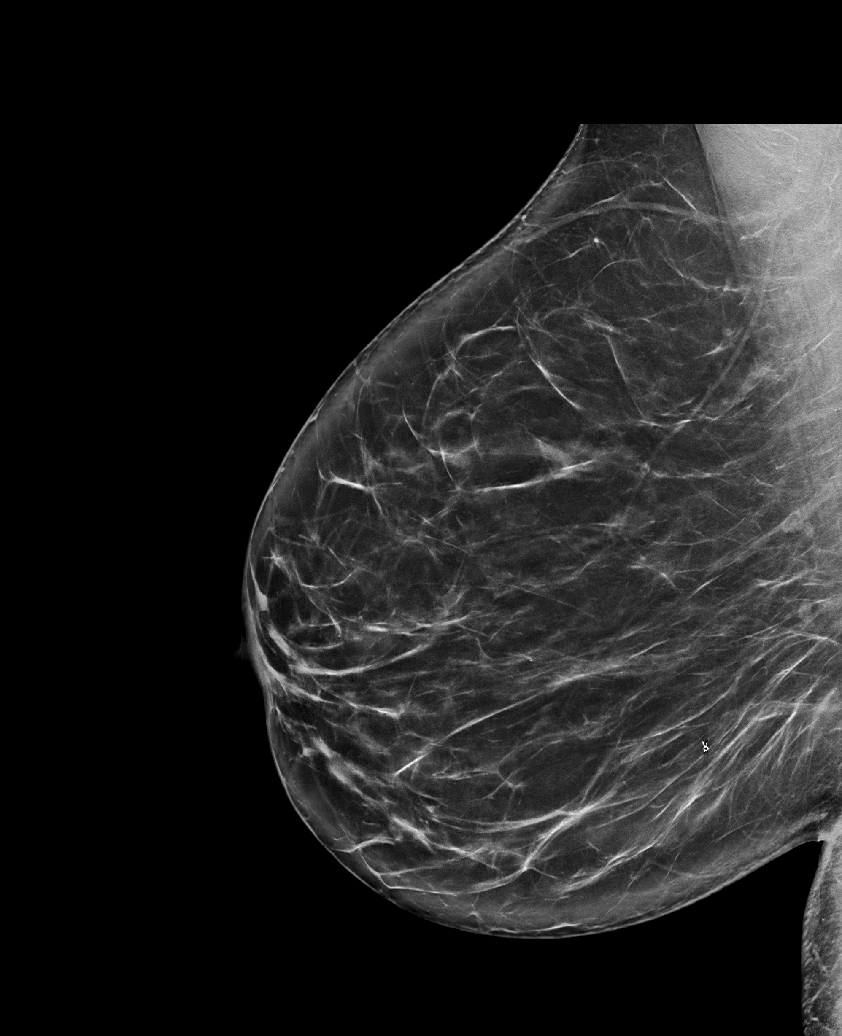

[L MLO tomo · tomo slice 43/84.0]
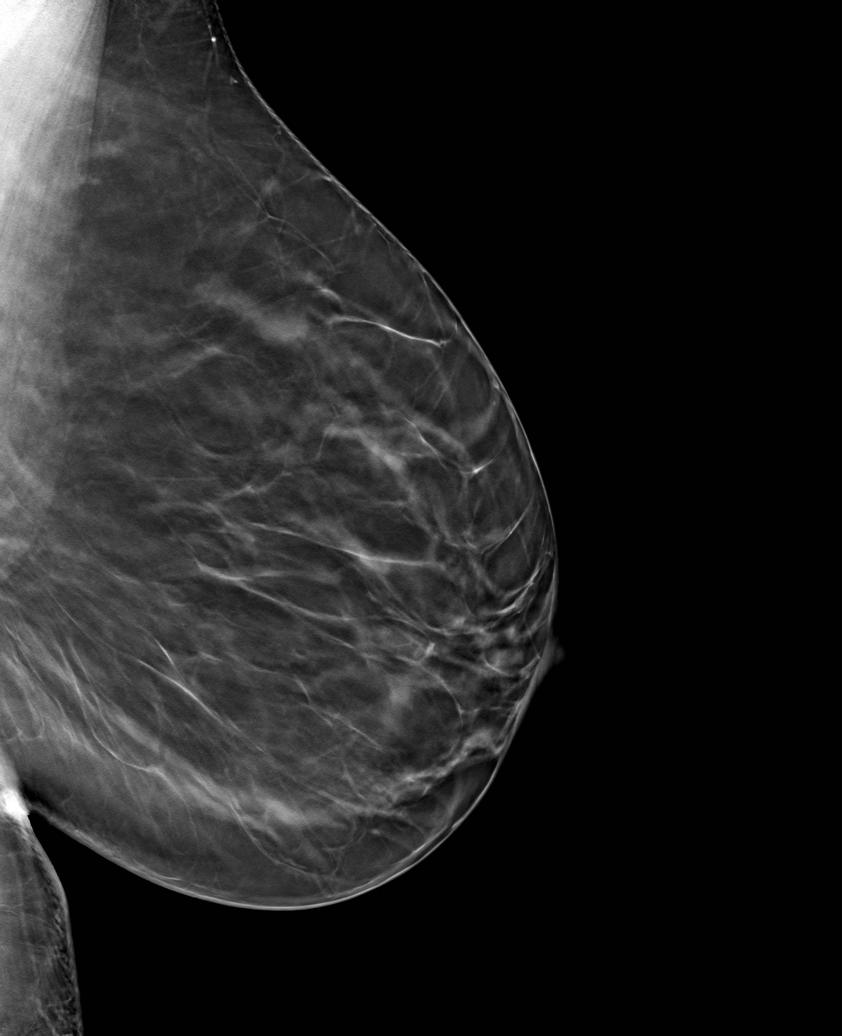

[6 of 30 positions shown; findings below may reference images not displayed]

ACR Breast Density Category b: There are scattered areas of
fibroglandular density.
FINDINGS: There are no findings suspicious for malignancy. Images were
processed with CAD.
IMPRESSION: No mammographic evidence of malignancy. A result letter of this
screening mammogram will be mailed directly to the patient.

RECOMMENDATION:
Screening mammogram in one year. (Code:[id number])

BI-RADS CATEGORY  1: Negative.

## 2019-03-24 ENCOUNTER — Encounter: Payer: Self-pay | Admitting: Family Medicine

## 2019-03-24 ENCOUNTER — Telehealth (INDEPENDENT_AMBULATORY_CARE_PROVIDER_SITE_OTHER): Payer: Self-pay | Admitting: Family Medicine

## 2019-03-24 DIAGNOSIS — Z9989 Dependence on other enabling machines and devices: Secondary | ICD-10-CM

## 2019-03-24 DIAGNOSIS — G4733 Obstructive sleep apnea (adult) (pediatric): Secondary | ICD-10-CM
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
## 2019-04-13 DIAGNOSIS — G4733 Obstructive sleep apnea (adult) (pediatric): Secondary | ICD-10-CM | POA: Diagnosis not present

## 2019-06-02 ENCOUNTER — Other Ambulatory Visit: Payer: Self-pay | Admitting: Family Medicine

## 2019-06-17 ENCOUNTER — Telehealth: Payer: BC Managed Care – PPO | Admitting: Family Medicine

## 2019-06-26 ENCOUNTER — Encounter: Payer: Self-pay | Admitting: Family Medicine

## 2019-06-29 ENCOUNTER — Encounter: Payer: Self-pay | Admitting: Family Medicine

## 2019-06-29 ENCOUNTER — Other Ambulatory Visit: Payer: Self-pay

## 2019-06-29 ENCOUNTER — Ambulatory Visit (INDEPENDENT_AMBULATORY_CARE_PROVIDER_SITE_OTHER): Payer: BC Managed Care – PPO | Admitting: Family Medicine

## 2019-06-29 DIAGNOSIS — F901 Attention-deficit hyperactivity disorder, predominantly hyperactive type: Secondary | ICD-10-CM

## 2019-06-29 DIAGNOSIS — E669 Obesity, unspecified: Secondary | ICD-10-CM | POA: Insufficient documentation

## 2019-06-29 MED ORDER — LISDEXAMFETAMINE DIMESYLATE 50 MG PO CAPS: 50.0000 mg | ORAL_CAPSULE | Freq: Every day | ORAL | 0 refills | Status: DC

## 2019-06-29 NOTE — Progress Notes (Signed)
BP (!) 132/94   Pulse 86   Temp (!) 97.5 F (36.4 C)   Wt 186 lb (84.4 kg)   BMI 35.14 kg/m    Subjective:    Patient ID: Pamela Avila, female    DOB: 25-Oct-1970, 48 y.o.   MRN: [id number]  HPI: Pamela Avila is a 48 y.o. female  Chief Complaint  Patient presents with  . ADD   ADHD FOLLOW UP ADHD status: stable Satisfied with current therapy: no Medication compliance:  good compliance Controlled substance contract: yes Previous psychiatry evaluation: no Previous medications: yes adderall, adderall XR, stratera (atomoxetine) and wellbutrin   Taking meds on weekends/vacations: yes Work/school performance:  good Difficulty sustaining attention/completing tasks: yes Distracted by extraneous stimuli: no Does not listen when spoken to: no  Fidgets with hands or feet: no Unable to stay in seat: no Blurts out/interrupts others: no ADHD Medication Side Effects: no    Decreased appetite: no    Headache: no    Sleeping disturbance pattern: no    Irritability: no    Rebound effects (worse than baseline) off medication: no    Anxiousness: no    Dizziness: no    Tics: no   WEIGHT GAIN Duration: chronic Previous attempts at weight loss: yes Complications of obesity:  GERD, OSA and depression Peak weight: current Weight loss goal: to healthy Weight loss to date: none Requesting obesity pharmacotherapy: no Current weight loss supplements/medications: not for weight- but on wellbutrin  Relevant past medical, surgical, family and social history reviewed and updated as indicated. Interim medical history since our last visit reviewed. Allergies and medications reviewed and updated.  Review of Systems  Constitutional: Negative.   Respiratory: Negative.   Cardiovascular: Negative.   Musculoskeletal: Negative.   Skin: Negative.   Neurological: Negative.   Psychiatric/Behavioral: Positive for decreased concentration. Negative for agitation, behavioral problems, confusion,  dysphoric mood, hallucinations, self-injury, sleep disturbance and suicidal ideas. The patient is hyperactive. The patient is not nervous/anxious.     Per HPI unless specifically indicated above     Objective:    BP (!) 132/94   Pulse 86   Temp (!) 97.5 F (36.4 C)   Wt 186 lb (84.4 kg)   BMI 35.14 kg/m   Wt Readings from Last 3 Encounters:  06/29/19 186 lb (84.4 kg)  03/12/19 180 lb (81.6 kg)  01/30/19 180 lb (81.6 kg)    Physical Exam Vitals signs and nursing note reviewed.  Constitutional:      General: She is not in acute distress.    Appearance: Normal appearance. She is obese. She is not ill-appearing, toxic-appearing or diaphoretic.  HENT:     Head: Normocephalic and atraumatic.     Right Ear: External ear normal.     Left Ear: External ear normal.     Nose: Nose normal.     Mouth/Throat:     Mouth: Mucous membranes are moist.     Pharynx: Oropharynx is clear.  Eyes:     General: No scleral icterus.       Right eye: No discharge.        Left eye: No discharge.     Conjunctiva/sclera: Conjunctivae normal.     Pupils: Pupils are equal, round, and reactive to light.  Neck:     Musculoskeletal: Normal range of motion.  Pulmonary:     Effort: Pulmonary effort is normal. No respiratory distress.     Comments: Speaking in full sentences Musculoskeletal: Normal range of motion.  Skin:    Coloration: Skin is not jaundiced or pale.     Findings: No bruising, erythema, lesion or rash.  Neurological:     Mental Status: She is alert and oriented to person, place, and time. Mental status is at baseline.  Psychiatric:        Mood and Affect: Mood normal.        Behavior: Behavior normal.        Thought Content: Thought content normal.        Judgment: Judgment normal.     Results for orders placed or performed in visit on 02/02/19  Novel Coronavirus, NAA ([facility])  Result Value Ref Range   SARS-CoV-2, NAA Not Detected Not Detected      Assessment & Plan:    Problem List Items Addressed This Visit      Other   ADHD (attention deficit hyperactivity disorder)    Not under great control. Will change from extended release adderall to vyvanse. Recheck 1 month. Call with any concerns. Continue to monitor.       Morbid obesity ([place]) - Primary    Mobid obesity due to GERD, OSA and depression. Will get her into see [facility] weight management clinic. Referral generated today. Call with any concerns.       Relevant Medications   lisdexamfetamine (VYVANSE) 50 MG capsule   Other Relevant Orders   Ambulatory referral to Endocrinology       Follow up plan: Return in about 4 weeks (around 07/27/2019).    . This visit was completed via FaceTime due to the restrictions of the COVID-19 pandemic. All issues as above were discussed and addressed. Physical exam was done as above through visual confirmation on FaceTIme. If it was felt that the patient should be evaluated in the office, they were directed there. The patient verbally consented to this visit. . Location of the patient: home . Location of the provider: work . Those involved with this call:  . Provider: Park Liter, DO . CMA: Tiffany Reel, CMA . Front Desk/Registration: Don Perking  . Time spent on call: 25 minutes with patient face to face via video conference. More than 50% of this time was spent in counseling and coordination of care. 40 minutes total spent in review of patient's record and preparation of their chart.

## 2019-06-29 NOTE — Assessment & Plan Note (Signed)
Mobid obesity due to GERD, OSA and depression. Will get her into see [name] weight management clinic. Referral generated today. Call with any concerns.

## 2019-06-29 NOTE — Assessment & Plan Note (Signed)
Not under great control. Will change from extended release adderall to vyvanse. Recheck 1 month. Call with any concerns. Continue to monitor.

## 2019-07-02 ENCOUNTER — Telehealth: Payer: Self-pay | Admitting: *Deleted

## 2019-07-07 ENCOUNTER — Telehealth: Payer: Self-pay | Admitting: Family Medicine

## 2019-07-07 ENCOUNTER — Telehealth: Payer: Self-pay
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
## 2019-07-07 NOTE — Telephone Encounter (Signed)
Patient was a no show for their virtual visit today.  

## 2019-07-08 NOTE — Telephone Encounter (Signed)
This pt should not be charged a noshow, as I had called her to change appt initially.

## 2019-07-20 ENCOUNTER — Encounter: Payer: Self-pay | Admitting: Family Medicine

## 2019-07-28 DIAGNOSIS — G4733 Obstructive sleep apnea (adult) (pediatric): Secondary | ICD-10-CM | POA: Diagnosis not present

## 2019-07-29 ENCOUNTER — Encounter: Payer: Self-pay | Admitting: Family Medicine

## 2019-08-12 ENCOUNTER — Encounter: Payer: Self-pay | Admitting: Family Medicine

## 2019-08-18 ENCOUNTER — Other Ambulatory Visit: Payer: Self-pay | Admitting: Family Medicine

## 2019-08-23 ENCOUNTER — Other Ambulatory Visit: Payer: Self-pay | Admitting: Family Medicine

## 2019-08-27 ENCOUNTER — Encounter: Payer: Self-pay | Admitting: Family Medicine

## 2019-08-27 ENCOUNTER — Ambulatory Visit (INDEPENDENT_AMBULATORY_CARE_PROVIDER_SITE_OTHER): Payer: Self-pay | Admitting: Family Medicine

## 2019-08-27 ENCOUNTER — Other Ambulatory Visit: Payer: Self-pay

## 2019-08-27 DIAGNOSIS — F901 Attention-deficit hyperactivity disorder, predominantly hyperactive type: Secondary | ICD-10-CM

## 2019-08-27 MED ORDER — AMPHETAMINE-DEXTROAMPHET ER 5 MG PO CP24: 5.0000 mg | ORAL_CAPSULE | Freq: Every day | ORAL | 0 refills | Status: DC

## 2019-08-27 MED ORDER — ATOMOXETINE HCL 80 MG PO CAPS: 80.0000 mg | ORAL_CAPSULE | Freq: Every day | ORAL | 1 refills | Status: DC

## 2019-08-27 MED ORDER — AMPHETAMINE-DEXTROAMPHET ER 30 MG PO CP24: 30.0000 mg | ORAL_CAPSULE | Freq: Every day | ORAL | 0 refills | Status: DC

## 2019-08-27 NOTE — Progress Notes (Signed)
There were no vitals taken for this visit.   Subjective:    Patient ID: Pamela Avila, female    DOB: November 02, 1970, 49 y.o.   MRN: [id number]  HPI: Pamela Avila is a 49 y.o. female  Chief Complaint  Patient presents with  . ADHD  . Medication Refill    Meclizine    ADHD FOLLOW UP- didn't feel like the vyvanse helped. Would like to go back on her old medicine.  ADHD status: uncontrolled Satisfied with current therapy: no Medication compliance:  good compliance Controlled substance contract: yes Previous psychiatry evaluation: no Previous medications: yes    Taking meds on weekends/vacations: yes Work/school performance:  fair Difficulty sustaining attention/completing tasks: yes Distracted by extraneous stimuli: yes Does not listen when spoken to: no  Fidgets with hands or feet: no Unable to stay in seat: no Blurts out/interrupts others: yes ADHD Medication Side Effects: no    Decreased appetite: no    Headache: no    Sleeping disturbance pattern: no    Irritability: no    Rebound effects (worse than baseline) off medication: no    Anxiousness: no    Dizziness: no    Tics: no  Relevant past medical, surgical, family and social history reviewed and updated as indicated. Interim medical history since our last visit reviewed. Allergies and medications reviewed and updated.  Review of Systems  Constitutional: Negative.   Respiratory: Negative.   Cardiovascular: Negative.   Musculoskeletal: Negative.   Skin: Negative.   Psychiatric/Behavioral: Positive for decreased concentration. Negative for agitation, behavioral problems, confusion, dysphoric mood, hallucinations, self-injury, sleep disturbance and suicidal ideas. The patient is not nervous/anxious and is not hyperactive.     Per HPI unless specifically indicated above     Objective:    There were no vitals taken for this visit.  Wt Readings from Last 3 Encounters:  06/29/19 186 lb (84.4 kg)  03/12/19 180 lb  (81.6 kg)  01/30/19 180 lb (81.6 kg)    Physical Exam Vitals and nursing note reviewed.  Constitutional:      General: She is not in acute distress.    Appearance: Normal appearance. She is not ill-appearing, toxic-appearing or diaphoretic.  HENT:     Head: Normocephalic and atraumatic.     Right Ear: External ear normal.     Left Ear: External ear normal.     Nose: Nose normal.     Mouth/Throat:     Mouth: Mucous membranes are moist.     Pharynx: Oropharynx is clear.  Eyes:     General: No scleral icterus.       Right eye: No discharge.        Left eye: No discharge.     Conjunctiva/sclera: Conjunctivae normal.     Pupils: Pupils are equal, round, and reactive to light.  Pulmonary:     Effort: Pulmonary effort is normal. No respiratory distress.     Comments: Speaking in full sentences Musculoskeletal:        General: Normal range of motion.     Cervical back: Normal range of motion.  Skin:    Coloration: Skin is not jaundiced or pale.     Findings: No bruising, erythema, lesion or rash.  Neurological:     Mental Status: She is alert and oriented to person, place, and time. Mental status is at baseline.  Psychiatric:        Mood and Affect: Mood normal.        Behavior: Behavior  normal.        Thought Content: Thought content normal.        Judgment: Judgment normal.     Results for orders placed or performed in visit on 02/02/19  Novel Coronavirus, NAA ([facility])  Result Value Ref Range   SARS-CoV-2, NAA Not Detected Not Detected      Assessment & Plan:   Problem List Items Addressed This Visit      Other   ADHD (attention deficit hyperactivity disorder)    Will restart her on her ER adderall. Will check in on how she's doing in 1 month. Call with any concerns. Continue to monitor.           Follow up plan: Return in about 4 weeks (around 09/24/2019) for physical/add.   . This visit was completed via FaceTime due to the restrictions of the COVID-19  pandemic. All issues as above were discussed and addressed. Physical exam was done as above through visual confirmation on FaceTime. If it was felt that the patient should be evaluated in the office, they were directed there. The patient verbally consented to this visit. . Location of the patient: Doctor's office . Location of the provider: home . Those involved with this call:  . Provider: Olevia Perches, DO . CMA: Tiffany Reel, CMA . Front Desk/Registration: Adela Ports  . Time spent on call: 15 minutes with patient face to face via video conference. More than 50% of this time was spent in counseling and coordination of care. 23 minutes total spent in review of patient's record and preparation of their chart.

## 2019-08-30 ENCOUNTER — Encounter: Payer: Self-pay | Admitting: Family Medicine

## 2019-08-30 NOTE — Assessment & Plan Note (Signed)
Will restart her on her ER adderall. Will check in on how she's doing in 1 month. Call with any concerns. Continue to monitor.

## 2019-09-29 ENCOUNTER — Telehealth (INDEPENDENT_AMBULATORY_CARE_PROVIDER_SITE_OTHER): Payer: BC Managed Care – PPO | Admitting: Family Medicine

## 2019-09-29 ENCOUNTER — Encounter: Payer: Self-pay | Admitting: Family Medicine

## 2019-09-29 VITALS — BP 111/78 | HR 91 | Temp 97.7°F | Wt 170.0 lb

## 2019-09-29 DIAGNOSIS — F901 Attention-deficit hyperactivity disorder, predominantly hyperactive type: Secondary | ICD-10-CM

## 2019-09-29 DIAGNOSIS — J0100 Acute maxillary sinusitis, unspecified: Secondary | ICD-10-CM

## 2019-09-29 MED ORDER — AMPHETAMINE-DEXTROAMPHET ER 30 MG PO CP24: 30.0000 mg | ORAL_CAPSULE | ORAL | 0 refills | Status: DC

## 2019-09-29 MED ORDER — AMPHETAMINE-DEXTROAMPHET ER 5 MG PO CP24: 5.0000 mg | ORAL_CAPSULE | Freq: Every day | ORAL | 0 refills | Status: DC

## 2019-09-29 MED ORDER — AMOXICILLIN-POT CLAVULANATE 875-125 MG PO TABS: 1.0000 | ORAL_TABLET | Freq: Two times a day (BID) | ORAL | 0 refills | Status: DC

## 2019-09-29 MED ORDER — AMPHETAMINE-DEXTROAMPHET ER 30 MG PO CP24: 30.0000 mg | ORAL_CAPSULE | Freq: Every day | ORAL | 0 refills | Status: DC

## 2019-09-29 NOTE — Progress Notes (Signed)
BP 111/78   Pulse 91   Temp 97.7 F (36.5 C)   Wt 170 lb (77.1 kg)   SpO2 98%   BMI 32.12 kg/m    Subjective:    Patient ID: Pamela Avila, female    DOB: 1971/07/31, 49 y.o.   MRN: [id number]  HPI: DAVISHA Avila is a 49 y.o. female  Chief Complaint  Patient presents with  . URI  . ADD   ADHD FOLLOW UP- feeling better with the adderall ADHD status: controlled Satisfied with current therapy: no Medication compliance:  excellent compliance Controlled substance contract: yes Previous psychiatry evaluation: no Previous medications: yes    Taking meds on weekends/vacations: yes Work/school performance:  excellent Difficulty sustaining attention/completing tasks: no Distracted by extraneous stimuli: no Does not listen when spoken to: no  Fidgets with hands or feet: no Unable to stay in seat: no Blurts out/interrupts others: no ADHD Medication Side Effects: no    Decreased appetite: no    Headache: no    Sleeping disturbance pattern: no    Irritability: no    Rebound effects (worse than baseline) off medication: no    Anxiousness: no    Dizziness: no    Tics: no  UPPER RESPIRATORY TRACT INFECTION Duration: 1.5-2 weeks Worst symptom: congestion and headache Fever: no Cough: yes Shortness of breath: no Wheezing: no Chest pain: no Chest tightness: no Chest congestion: no Nasal congestion: yes Runny nose: yes Post nasal drip: yes Sneezing: no Sore throat: yes Swollen glands: yes Sinus pressure: yes Headache: yes Face pain: yes Toothache: no Ear pain: no  Ear pressure: no  Eyes red/itching:no Eye drainage/crusting: no  Vomiting: no Rash: no Fatigue: yes Sick contacts: no Strep contacts: no  Context: worse Recurrent sinusitis: no Relief with OTC cold/cough medications: no  Treatments attempted: cold/sinus   Relevant past medical, surgical, family and social history reviewed and updated as indicated. Interim medical history since our last visit  reviewed. Allergies and medications reviewed and updated.  Review of Systems  Constitutional: Negative.   HENT: Positive for congestion, postnasal drip, rhinorrhea, sinus pressure, sinus pain, sneezing and sore throat. Negative for dental problem, drooling, ear discharge, ear pain, facial swelling, hearing loss, mouth sores, nosebleeds, tinnitus, trouble swallowing and voice change.   Respiratory: Negative.   Cardiovascular: Negative.   Psychiatric/Behavioral: Negative.     Per HPI unless specifically indicated above     Objective:    BP 111/78   Pulse 91   Temp 97.7 F (36.5 C)   Wt 170 lb (77.1 kg)   SpO2 98%   BMI 32.12 kg/m   Wt Readings from Last 3 Encounters:  09/29/19 170 lb (77.1 kg)  06/29/19 186 lb (84.4 kg)  03/12/19 180 lb (81.6 kg)    Physical Exam Vitals and nursing note reviewed.  Constitutional:      General: She is not in acute distress.    Appearance: Normal appearance. She is not ill-appearing, toxic-appearing or diaphoretic.  HENT:     Head: Normocephalic and atraumatic.     Right Ear: External ear normal.     Left Ear: External ear normal.     Nose: Nose normal.     Mouth/Throat:     Mouth: Mucous membranes are moist.     Pharynx: Oropharynx is clear.  Eyes:     General: No scleral icterus.       Right eye: No discharge.        Left eye: No discharge.  Conjunctiva/sclera: Conjunctivae normal.     Pupils: Pupils are equal, round, and reactive to light.  Pulmonary:     Effort: Pulmonary effort is normal. No respiratory distress.     Comments: Speaking in full sentences Musculoskeletal:        General: Normal range of motion.     Cervical back: Normal range of motion.  Skin:    Coloration: Skin is not jaundiced or pale.     Findings: No bruising, erythema, lesion or rash.  Neurological:     Mental Status: She is alert and oriented to person, place, and time. Mental status is at baseline.  Psychiatric:        Mood and Affect: Mood  normal.        Behavior: Behavior normal.        Thought Content: Thought content normal.        Judgment: Judgment normal.     Results for orders placed or performed in visit on 02/02/19  Novel Coronavirus, NAA ([facility])  Result Value Ref Range   SARS-CoV-2, NAA Not Detected Not Detected      Assessment & Plan:   Problem List Items Addressed This Visit      Other   ADHD (attention deficit hyperactivity disorder) - Primary    Under good control on current regimen. Continue current regimen. Continue to monitor. Call with any concerns. Refills given for 3 months. Follow up 3 months.         Other Visit Diagnoses    Acute non-recurrent maxillary sinusitis       Will test for COVID given overlap- self-quarantine until results are back. Will treat with augmentin. Call if not getting better or getting worse.   Relevant Medications   amoxicillin-clavulanate (AUGMENTIN) 875-125 MG tablet   Other Relevant Orders   Novel Coronavirus, NAA ([facility])       Follow up plan: Return in about 3 months (around 12/28/2019) for Physical.    . This visit was completed via mychart due to the restrictions of the COVID-19 pandemic. All issues as above were discussed and addressed. Physical exam was done as above through visual confirmation on mychart. If it was felt that the patient should be evaluated in the office, they were directed there. The patient verbally consented to this visit. . Location of the patient: home . Location of the provider: work . Those involved with this call:  . Provider: Olevia Perches, DO . CMA: Tiffany Reel, CMA . Front Desk/Registration: Adela Ports  . Time spent on call: 25 minutes with patient face to face via video conference. More than 50% of this time was spent in counseling and coordination of care. 40 minutes total spent in review of patient's record and preparation of their chart.

## 2019-09-29 NOTE — Assessment & Plan Note (Signed)
Under good control on current regimen. Continue current regimen. Continue to monitor. Call with any concerns. Refills given for 3 months. Follow up 3 months.    

## 2019-09-29 NOTE — Progress Notes (Signed)
Lvm to make this physical

## 2019-10-14 ENCOUNTER — Encounter: Payer: Self-pay | Admitting: Family Medicine

## 2019-11-13 ENCOUNTER — Other Ambulatory Visit: Payer: Self-pay | Admitting: Family Medicine

## 2019-12-21 ENCOUNTER — Other Ambulatory Visit: Payer: Self-pay

## 2019-12-21 ENCOUNTER — Encounter: Payer: Self-pay | Admitting: Family Medicine

## 2019-12-21 ENCOUNTER — Ambulatory Visit (INDEPENDENT_AMBULATORY_CARE_PROVIDER_SITE_OTHER): Payer: BC Managed Care – PPO | Admitting: Family Medicine

## 2019-12-21 VITALS — BP 144/83 | HR 85 | Temp 99.0°F | Ht 60.63 in | Wt 170.8 lb

## 2019-12-21 DIAGNOSIS — F32A Depression, unspecified: Secondary | ICD-10-CM

## 2019-12-21 DIAGNOSIS — Z0000 Encounter for general adult medical examination without abnormal findings: Secondary | ICD-10-CM

## 2019-12-21 DIAGNOSIS — E669 Obesity, unspecified: Secondary | ICD-10-CM

## 2019-12-21 DIAGNOSIS — I10 Essential (primary) hypertension: Secondary | ICD-10-CM | POA: Diagnosis not present

## 2019-12-21 DIAGNOSIS — F329 Major depressive disorder, single episode, unspecified: Secondary | ICD-10-CM

## 2019-12-21 DIAGNOSIS — K219 Gastro-esophageal reflux disease without esophagitis: Secondary | ICD-10-CM

## 2019-12-21 DIAGNOSIS — E66811 Obesity, class 1: Secondary | ICD-10-CM

## 2019-12-21 DIAGNOSIS — Z1231 Encounter for screening mammogram for malignant neoplasm of breast: Secondary | ICD-10-CM

## 2019-12-21 DIAGNOSIS — Z833 Family history of diabetes mellitus: Secondary | ICD-10-CM

## 2019-12-21 DIAGNOSIS — F419 Anxiety disorder, unspecified: Secondary | ICD-10-CM

## 2019-12-21 DIAGNOSIS — F901 Attention-deficit hyperactivity disorder, predominantly hyperactive type: Secondary | ICD-10-CM | POA: Diagnosis not present

## 2019-12-21 DIAGNOSIS — Z13220 Encounter for screening for lipoid disorders: Secondary | ICD-10-CM | POA: Diagnosis not present

## 2019-12-21 DIAGNOSIS — E038 Other specified hypothyroidism: Secondary | ICD-10-CM | POA: Diagnosis not present

## 2019-12-21 LAB — BAYER DCA HB A1C WAIVED: HB A1C (BAYER DCA - WAIVED): 5 % (ref <7.0)

## 2019-12-21 LAB — MICROALBUMIN, URINE WAIVED
Creatinine, Urine Waived: 200 mg/dL (ref 10–300)
Microalb, Ur Waived: 30 mg/L — ABNORMAL HIGH (ref 0–19)
Microalb/Creat Ratio: <30 mg/g (ref <30)

## 2019-12-21 NOTE — Patient Instructions (Signed)
[employer]

## 2019-12-21 NOTE — Progress Notes (Signed)
BP (!) 144/83 (BP Location: Left Arm, Patient Position: Sitting, Cuff Size: Normal)   Pulse 85   Temp 99 F (37.2 C) (Oral)   Ht 5' 0.63" (1.54 m)   Wt 170 lb 12.8 oz (77.5 kg)   SpO2 100%   BMI 32.67 kg/m    Subjective:    Patient ID: Pamela Avila, female    DOB: 1971-02-06, 49 y.o.   MRN: [id number]  HPI: Pamela Avila is a 49 y.o. female presenting on 12/21/2019 for comprehensive medical examination. Current medical complaints include:  HYPERTENSION Hypertension status: stable  Satisfied with current treatment? yes Duration of hypertension: chronic BP monitoring frequency:  not checking BP medication side effects:  no Medication compliance: excellent compliance Previous BP meds: metoprolol Aspirin: no Recurrent headaches: no Visual changes: no Palpitations: no Dyspnea: no Chest pain: no Lower extremity edema: no Dizzy/lightheaded: yes  HYPOTHYROIDISM Thyroid control status:controlled Satisfied with current treatment? yes Medication side effects: no Medication compliance: excellent compliance Etiology of hypothyroidism:  Recent dose adjustment:no Fatigue: no Cold intolerance: no Heat intolerance: no Weight gain: no Weight loss: no Constipation: no Diarrhea/loose stools: no Palpitations: no Lower extremity edema: no Anxiety/depressed mood: yes  ADHD FOLLOW UP ADHD status: controlled Satisfied with current therapy: yes Medication compliance:  excellent compliance Controlled substance contract: yes Previous psychiatry evaluation: yes Previous medications: yes    Taking meds on weekends/vacations: yes Work/school performance:  excellent Difficulty sustaining attention/completing tasks: no Distracted by extraneous stimuli: no Does not listen when spoken to: no  Fidgets with hands or feet: no Unable to stay in seat: no Blurts out/interrupts others: no ADHD Medication Side Effects: no    Decreased appetite: no    Headache: no    Sleeping disturbance  pattern: no    Irritability: no    Rebound effects (worse than baseline) off medication: no    Anxiousness: no    Dizziness: no    Tics: no  ANXIETY/DEPRESSION- left her husband, feeling much better Duration:better Anxious mood: yes  Excessive worrying: yes Irritability: no  Sweating: no Nausea: no Palpitations:no Hyperventilation: no Panic attacks: no Agoraphobia: no  Obscessions/compulsions: no Depressed mood: yes Depression screen [facility] 2/9 12/21/2019 06/29/2019 03/12/2019 01/30/2019 10/24/2018  Decreased Interest 0 Down, Depressed, Hopeless 0 PHQ - 2 Score 0 Altered sleeping Tired, decreased energy Change in appetite 1 0 0 0 0  Feeling bad or failure about yourself  0 0 1 0 0  Trouble concentrating Moving slowly or fidgety/restless 1 1 0 1 0  Suicidal thoughts 0 0 0 0 0  PHQ-9 Score Difficult doing work/chores Somewhat difficult Somewhat difficult Somewhat difficult Very difficult Somewhat difficult  Some recent data might be hidden   Anhedonia: no Weight changes: no Insomnia: no   Hypersomnia: no Fatigue/loss of energy: no Feelings of worthlessness: no Feelings of guilt: no Impaired concentration/indecisiveness: no Suicidal ideations: no  Crying spells: no Recent Stressors/Life Changes: yes   Relationship problems: yes   Family stress: yes     Financial stress: no    Job stress: no    Recent death/loss: no  Menopausal Symptoms: yes- hot flashes and night sweats  Depression Screen done today and results listed below:  Depression screen [facility] 2/9 12/21/2019 06/29/2019 03/12/2019 01/30/2019 10/24/2018  Decreased Interest  0 1 1 2 1   Down, Depressed, Hopeless 0 1 1 2 1   PHQ - 2 Score 0 2 2 4 2   Altered sleeping 1 1 1 3 1   Tired, decreased energy 1 2 1 2 1   Change in appetite 1 0 0 0 0  Feeling bad or failure about yourself  0 0 1 0 0  Trouble concentrating 2 2 2 2 1   Moving slowly or  fidgety/restless 1 1 0 1 0  Suicidal thoughts 0 0 0 0 0  PHQ-9 Score 6 8 7 12 5   Difficult doing work/chores Somewhat difficult Somewhat difficult Somewhat difficult Very difficult Somewhat difficult  Some recent data might be hidden    Past Medical History:  Past Medical History:  Diagnosis Date  . ADHD (attention deficit hyperactivity disorder)   . Anemia   . Anxiety   . Anxiety disorder   . Complication of anesthesia   . Depression   . Family history of adverse reaction to anesthesia   . Family history of diabetes mellitus   . GERD (gastroesophageal reflux disease)   . HSV (herpes simplex virus) infection   . Hypertension   . Hypothyroidism   . IBS (irritable bowel syndrome)   . PONV (postoperative nausea and vomiting)   . Sleep apnea   . Thyroid disease    hypothroidsim    Surgical History:  Past Surgical History:  Procedure Laterality Date  . BREAST EXCISIONAL BIOPSY Right 2010   neg surgical breast bx    . BREAST SURGERY Right    lumpectomy  . CESAREAN SECTION     x 2  . COLONOSCOPY WITH PROPOFOL N/A 12/28/2016   Procedure: COLONOSCOPY WITH PROPOFOL;  Surgeon: , MD;  Location: [facility] ENDOSCOPY;  Service: Endoscopy;  Laterality: N/A;  . CRYOTHERAPY  20 + years ago   cervix  . DILITATION & CURRETTAGE/HYSTROSCOPY WITH NOVASURE ABLATION N/A 04/11/2015   Procedure: DILATATION & CURETTAGE/HYSTEROSCOPY WITH NOVASURE ABLATION;  Surgeon: , MD;  Location: [facility] ORS;  Service: Gynecology;  Laterality: N/A;  . ESOPHAGOGASTRODUODENOSCOPY (EGD) WITH PROPOFOL N/A 12/28/2016   Procedure: ESOPHAGOGASTRODUODENOSCOPY (EGD) WITH PROPOFOL;  Surgeon: 2011, MD;  Location: [facility] ENDOSCOPY;  Service: Endoscopy;  Laterality: N/A;  . ESSURE TUBAL LIGATION    . lumpectomy Right   . NASAL SEPTOPLASTY W/ TURBINOPLASTY Bilateral 10/07/2018   Procedure: SEPTOPLASTY WITH BILATERAL SUBMUCOUS RESECTION OF INFERIOR TURBINATES;  Surgeon: [facility], MD;   Location: [facility] ORS;  Service: ENT;  Laterality: Bilateral;  . TONSILLECTOMY    . TUBAL LIGATION      Medications:  Current Outpatient Medications on File Prior to Visit  Medication Sig  . levothyroxine (SYNTHROID) 100 MCG tablet TAKE 1 TABLET BY MOUTH DAILY BEFORE BREAKFAST  . meclizine (ANTIVERT) 25 MG tablet Take 1 tablet (25 mg total) by mouth 3 (three) times daily as needed for dizziness.   No current facility-administered medications on file prior to visit.    Allergies:  Allergies  Allergen Reactions  . Erythromycin Itching    Social History:  Social History   Socioeconomic History  . Marital status: Married    Spouse name: Not on file  . Number of children: Not on file  . Years of education: Not on file  . Highest education level: Not on file  Occupational History  . Not on file  Tobacco Use  . Smoking status: Current Every Day Smoker    Packs/day: 1.00    Types: Cigarettes  . Smokeless  tobacco: Never Used  Substance and Sexual Activity  . Alcohol use: Yes    Alcohol/week: 10.0 standard drinks    Types: 10 Shots of liquor per week    Comment: occasional  . Drug use: Yes    Types: Marijuana    Comment: Occasional, hx of cocaine in past  . Sexual activity: Yes  Other Topics Concern  . Not on file  Social History Narrative  . Not on file   Social Determinants of Health   Financial Resource Strain:   . Difficulty of Paying Living Expenses:   Food Insecurity:   . Worried About Programme researcher, broadcasting/film/videounning Out of Food in the Last Year:   . Baristaan Out of Food in the Last Year:   Transportation Needs:   . Freight forwarderLack of Transportation (Medical):   [name]. Lack of Transportation (Non-Medical):   Physical Activity:   . Days of Exercise per Week:   . Minutes of Exercise per Session:   Stress:   . Feeling of Stress :   Social Connections:   . Frequency of Communication with Friends and Family:   . Frequency of Social Gatherings with Friends and Family:   . Attends Religious Services:   .  Active Member of Clubs or Organizations:   . Attends BankerClub or Organization Meetings:   [name]. Marital Status:   Intimate Partner Violence:   . Fear of Current or Ex-Partner:   . Emotionally Abused:   [name]. Physically Abused:   . Sexually Abused:    Social History   Tobacco Use  Smoking Status Current Every Day Smoker  . Packs/day: 1.00  . Types: Cigarettes  Smokeless Tobacco Never Used   Social History   Substance and Sexual Activity  Alcohol Use Yes  . Alcohol/week: 10.0 standard drinks  . Types: 10 Shots of liquor per week   Comment: occasional    Family History:  Family History  Problem Relation Age of Onset  . Hypothyroidism Paternal Grandfather   . Breast cancer Paternal Grandmother   . Cancer Paternal Grandmother        breast  . Breast cancer Maternal Grandmother   . Hyperlipidemia Maternal Grandfather   . Diabetes Father   . Obesity Father   . Hypertension Father   . Hyperlipidemia Father   . Ovarian cancer Paternal Aunt        ?  [name]. Cancer Paternal Aunt        breast and cervical  . Breast cancer Paternal Aunt   . Hypothyroidism Mother   . Breast cancer Son   . Cancer Son        breast  . Colon cancer Neg Hx     Past medical history, surgical history, medications, allergies, family history and social history reviewed with patient today and changes made to appropriate areas of the chart.   Review of Systems  Constitutional: Negative.   HENT: Negative.   Eyes: Negative.   Respiratory: Positive for wheezing. Negative for cough, hemoptysis, sputum production and shortness of breath.   Cardiovascular: Negative.   Gastrointestinal: Positive for heartburn, nausea and vomiting. Negative for abdominal pain, blood in stool, constipation, diarrhea and melena.  Genitourinary: Negative.   Musculoskeletal: Negative.   Skin: Negative.   Neurological: Negative.   Endo/Heme/Allergies: Negative for environmental allergies and polydipsia. Bruises/bleeds easily.    Psychiatric/Behavioral: Negative for depression, hallucinations, memory loss, substance abuse and suicidal ideas. The patient is nervous/anxious. The patient does not have insomnia.     All other ROS negative except  what is listed above and in the HPI.      Objective:    BP (!) 144/83 (BP Location: Left Arm, Patient Position: Sitting, Cuff Size: Normal)   Pulse 85   Temp 99 F (37.2 C) (Oral)   Ht 5' 0.63" (1.54 m)   Wt 170 lb 12.8 oz (77.5 kg)   SpO2 100%   BMI 32.67 kg/m   Wt Readings from Last 3 Encounters:  12/21/19 170 lb 12.8 oz (77.5 kg)  09/29/19 170 lb (77.1 kg)  06/29/19 186 lb (84.4 kg)    Physical Exam Vitals and nursing note reviewed.  Constitutional:      General: She is not in acute distress.    Appearance: Normal appearance. She is not ill-appearing, toxic-appearing or diaphoretic.  HENT:     Head: Normocephalic and atraumatic.     Right Ear: Tympanic membrane, ear canal and external ear normal. There is no impacted cerumen.     Left Ear: Tympanic membrane, ear canal and external ear normal. There is no impacted cerumen.     Nose: Nose normal. No congestion or rhinorrhea.     Mouth/Throat:     Mouth: Mucous membranes are moist.     Pharynx: Oropharynx is clear. No oropharyngeal exudate or posterior oropharyngeal erythema.  Eyes:     General: No scleral icterus.       Right eye: No discharge.        Left eye: No discharge.     Extraocular Movements: Extraocular movements intact.     Conjunctiva/sclera: Conjunctivae normal.     Pupils: Pupils are equal, round, and reactive to light.  Neck:     Vascular: No carotid bruit.  Cardiovascular:     Rate and Rhythm: Normal rate and regular rhythm.     Pulses: Normal pulses.     Heart sounds: No murmur. No friction rub. No gallop.   Pulmonary:     Effort: Pulmonary effort is normal. No respiratory distress.     Breath sounds: Normal breath sounds. No stridor. No wheezing, rhonchi or rales.  Chest:     Chest  wall: No tenderness.  Abdominal:     General: Abdomen is flat. Bowel sounds are normal. There is no distension.     Palpations: Abdomen is soft. There is no mass.     Tenderness: There is no abdominal tenderness. There is no right CVA tenderness, left CVA tenderness, guarding or rebound.     Hernia: No hernia is present.  Genitourinary:    Comments: Breast and pelvic exams deferred with shared decision making Musculoskeletal:        General: No swelling, tenderness, deformity or signs of injury.     Cervical back: Normal range of motion and neck supple. No rigidity. No muscular tenderness.     Right lower leg: No edema.     Left lower leg: No edema.  Lymphadenopathy:     Cervical: No cervical adenopathy.  Skin:    General: Skin is warm and dry.     Capillary Refill: Capillary refill takes less than 2 seconds.     Coloration: Skin is not jaundiced or pale.     Findings: No bruising, erythema, lesion or rash.  Neurological:     General: No focal deficit present.     Mental Status: She is alert and oriented to person, place, and time. Mental status is at baseline.     Cranial Nerves: No cranial nerve deficit.     Sensory: No sensory  deficit.     Motor: No weakness.     Coordination: Coordination normal.     Gait: Gait normal.     Deep Tendon Reflexes: Reflexes normal.  Psychiatric:        Mood and Affect: Mood normal.        Behavior: Behavior normal.        Thought Content: Thought content normal.        Judgment: Judgment normal.     Results for orders placed or performed in visit on 12/21/19  Bayer DCA Hb A1c Waived  Result Value Ref Range   HB A1C (BAYER DCA - WAIVED) 5.0 <7.0 %  CBC with Differential/Platelet  Result Value Ref Range   WBC 5.5 3.4 - 10.8 x10E3/uL   RBC 5.06 3.77 - 5.28 x10E6/uL   Hemoglobin 15.7 11.1 - 15.9 g/dL   Hematocrit 63.8 (H) 93.7 - 46.6 %   MCV 94 79 - 97 fL   MCH 31.0 26.6 - 33.0 pg   MCHC 33.0 31.5 - 35.7 g/dL   RDW 34.2 87.6 - 81.1 %    Platelets 288 150 - 450 x10E3/uL   Neutrophils 47 Not Estab. %   Lymphs 39 Not Estab. %   Monocytes 9 Not Estab. %   Eos 4 Not Estab. %   Basos 1 Not Estab. %   Neutrophils Absolute 2.6 1.4 - 7.0 x10E3/uL   Lymphocytes Absolute 2.1 0.7 - 3.1 x10E3/uL   Monocytes Absolute 0.5 0.1 - 0.9 x10E3/uL   EOS (ABSOLUTE) 0.2 0.0 - 0.4 x10E3/uL   Basophils Absolute 0.1 0.0 - 0.2 x10E3/uL   Immature Granulocytes 0 Not Estab. %   Immature Grans (Abs) 0.0 0.0 - 0.1 x10E3/uL  Comprehensive metabolic panel  Result Value Ref Range   Glucose 119 (H) 65 - 99 mg/dL   BUN 8 6 - 24 mg/dL   Creatinine, Ser 5.72 0.57 - 1.00 mg/dL   GFR calc non Af Amer 73 >59 mL/min/1.73   GFR calc Af Amer 84 >59 mL/min/1.73   BUN/Creatinine Ratio 9 9 - 23   Sodium 139 134 - 144 mmol/L   Potassium 4.4 3.5 - 5.2 mmol/L   Chloride 102 96 - 106 mmol/L   CO2 22 20 - 29 mmol/L   Calcium 9.2 8.7 - 10.2 mg/dL   Total Protein 6.7 6.0 - 8.5 g/dL   Albumin 4.2 3.8 - 4.8 g/dL   Globulin, Total 2.5 1.5 - 4.5 g/dL   Albumin/Globulin Ratio 1.7 1.2 - 2.2   Bilirubin Total 0.5 0.0 - 1.2 mg/dL   Alkaline Phosphatase 91 39 - 117 IU/L   AST 19 0 - 40 IU/L   ALT 17 0 - 32 IU/L  Lipid Panel w/o Chol/HDL Ratio  Result Value Ref Range   Cholesterol, Total 212 (H) 100 - 199 mg/dL   Triglycerides 620 0 - 149 mg/dL   HDL 59 >35 mg/dL   VLDL Cholesterol Cal 23 5 - 40 mg/dL   LDL Chol Calc (NIH) 597 (H) 0 - 99 mg/dL  Microalbumin, Urine Waived  Result Value Ref Range   Microalb, Ur Waived 30 (H) 0 - 19 mg/L   Creatinine, Urine Waived 200 10 - 300 mg/dL   Microalb/Creat Ratio <30 <30 mg/g  TSH  Result Value Ref Range   TSH 5.690 (H) 0.450 - 4.500 uIU/mL      Assessment & Plan:   Problem List Items Addressed This Visit      Cardiovascular and Mediastinum  Essential hypertension, benign    Under good control on current regimen. Continue current regimen. Continue to monitor. Call with any concerns. Refills given. Labs drawn  today.       Relevant Medications   metoprolol succinate (TOPROL-XL) 25 MG 24 hr tablet   Other Relevant Orders   CBC with Differential/Platelet (Completed)   Comprehensive metabolic panel (Completed)   Microalbumin, Urine Waived (Completed)     Digestive   GERD (gastroesophageal reflux disease)    Under good control on current regimen. Continue current regimen. Continue to monitor. Call with any concerns. Refills given. Labs drawn today.      Relevant Medications   dicyclomine (BENTYL) 10 MG capsule   omeprazole (PRILOSEC) 20 MG capsule   Other Relevant Orders   CBC with Differential/Platelet (Completed)   Comprehensive metabolic panel (Completed)     Endocrine   Hypothyroidism    Rechecking levels today. Await results. Treat as needed.       Relevant Medications   metoprolol succinate (TOPROL-XL) 25 MG 24 hr tablet   Other Relevant Orders   CBC with Differential/Platelet (Completed)   Comprehensive metabolic panel (Completed)   TSH (Completed)     Other   ADHD (attention deficit hyperactivity disorder)    Under good control on current regimen. Continue current regimen. Continue to monitor. Call with any concerns. Refills given for 3 months. Follow up in 3 months.      Relevant Orders   CBC with Differential/Platelet (Completed)   Comprehensive metabolic panel (Completed)   Anxiety and depression    Doing very well, would like to decrease her wellbutrin. Will cut down to 150mg  and recheck 3 months. Call with any concerns.       Relevant Medications   busPIRone (BUSPAR) 5 MG tablet   buPROPion (WELLBUTRIN XL) 150 MG 24 hr tablet   escitalopram (LEXAPRO) 20 MG tablet   Other Relevant Orders   CBC with Differential/Platelet (Completed)   Comprehensive metabolic panel (Completed)   Family history of diabetes mellitus    Rechecking levels today. Await results. Call with any concerns.       Relevant Orders   Bayer DCA Hb A1c Waived (Completed)   CBC with  Differential/Platelet (Completed)   Comprehensive metabolic panel (Completed)   Obesity (BMI 30.0-34.9)    Continue diet and exercise. Call with any concerns. Continue to monitor.        Other Visit Diagnoses    Routine general medical examination at a health care facility    -  Primary   Vaccines up to date. Screening labs checked today. Pap up to date. Mammogram ordered today. Continue diet and exercise. Call with any concerns.   Relevant Orders   Bayer DCA Hb A1c Waived (Completed)   CBC with Differential/Platelet (Completed)   Comprehensive metabolic panel (Completed)   Lipid Panel w/o Chol/HDL Ratio (Completed)   Microalbumin, Urine Waived (Completed)   TSH (Completed)   Screening for cholesterol level       Relevant Orders   Lipid Panel w/o Chol/HDL Ratio (Completed)   Encounter for screening mammogram for malignant neoplasm of breast       Mammogram ordered today.   Relevant Orders   MM 3D SCREEN BREAST BILATERAL       Follow up plan: Return in about 3 months (around 03/22/2020).   LABORATORY TESTING:  - Pap smear: up to date  IMMUNIZATIONS:   - Tdap: Tetanus vaccination status reviewed: last tetanus booster within 10 years. - Influenza: Postponed  to flu season - Pneumovax: Up to date  SCREENING: -Mammogram: Ordered today  - Colonoscopy: Not applicable   PATIENT COUNSELING:   Advised to take 1 mg of folate supplement per day if capable of pregnancy.   Sexuality: Discussed sexually transmitted diseases, partner selection, use of condoms, avoidance of unintended pregnancy  and contraceptive alternatives.   Advised to avoid cigarette smoking.  I discussed with the patient that most people either abstain from alcohol or drink within safe limits (<=14/week and <=4 drinks/occasion for males, <=7/weeks and <= 3 drinks/occasion for females) and that the risk for alcohol disorders and other health effects rises proportionally with the number of drinks per week and how  often a drinker exceeds daily limits.  Discussed cessation/primary prevention of drug use and availability of treatment for abuse.   Diet: Encouraged to adjust caloric intake to maintain  or achieve ideal body weight, to reduce intake of dietary saturated fat and total fat, to limit sodium intake by avoiding high sodium foods and not adding table salt, and to maintain adequate dietary potassium and calcium preferably from fresh fruits, vegetables, and low-fat dairy products.    stressed the importance of regular exercise  Injury prevention: Discussed safety belts, safety helmets, smoke detector, smoking near bedding or upholstery.   Dental health: Discussed importance of regular tooth brushing, flossing, and dental visits.    NEXT PREVENTATIVE PHYSICAL DUE IN 1 YEAR. Return in about 3 months (around 03/22/2020).

## 2019-12-22 LAB — CBC WITH DIFFERENTIAL/PLATELET
Basophils Absolute: 0.1 10*3/uL (ref 0.0–0.2)
Basos: 1 %
EOS (ABSOLUTE): 0.2 10*3/uL (ref 0.0–0.4)
Eos: 4 %
Hematocrit: 47.6 % — ABNORMAL HIGH (ref 34.0–46.6)
Hemoglobin: 15.7 g/dL (ref 11.1–15.9)
Immature Grans (Abs): 0 10*3/uL (ref 0.0–0.1)
Immature Granulocytes: 0 %
Lymphocytes Absolute: 2.1 10*3/uL (ref 0.7–3.1)
Lymphs: 39 %
MCH: 31 pg (ref 26.6–33.0)
MCHC: 33 g/dL (ref 31.5–35.7)
MCV: 94 fL (ref 79–97)
Monocytes Absolute: 0.5 10*3/uL (ref 0.1–0.9)
Monocytes: 9 %
Neutrophils Absolute: 2.6 10*3/uL (ref 1.4–7.0)
Neutrophils: 47 %
Platelets: 288 10*3/uL (ref 150–450)
RBC: 5.06 x10E6/uL (ref 3.77–5.28)
RDW: 13.4 % (ref 11.7–15.4)
WBC: 5.5 10*3/uL (ref 3.4–10.8)

## 2019-12-22 LAB — COMPREHENSIVE METABOLIC PANEL
ALT: 17 IU/L (ref 0–32)
AST: 19 IU/L (ref 0–40)
Albumin/Globulin Ratio: 1.7 (ref 1.2–2.2)
Albumin: 4.2 g/dL (ref 3.8–4.8)
Alkaline Phosphatase: 91 IU/L (ref 39–117)
BUN/Creatinine Ratio: 9 (ref 9–23)
BUN: 8 mg/dL (ref 6–24)
Bilirubin Total: 0.5 mg/dL (ref 0.0–1.2)
CO2: 22 mmol/L (ref 20–29)
Calcium: 9.2 mg/dL (ref 8.7–10.2)
Chloride: 102 mmol/L (ref 96–106)
Creatinine, Ser: 0.93 mg/dL (ref 0.57–1.00)
GFR calc Af Amer: 84 mL/min/{1.73_m2} (ref >59)
GFR calc non Af Amer: 73 mL/min/{1.73_m2} (ref >59)
Globulin, Total: 2.5 g/dL (ref 1.5–4.5)
Glucose: 119 mg/dL — ABNORMAL HIGH (ref 65–99)
Potassium: 4.4 mmol/L (ref 3.5–5.2)
Sodium: 139 mmol/L (ref 134–144)
Total Protein: 6.7 g/dL (ref 6.0–8.5)

## 2019-12-22 LAB — LIPID PANEL W/O CHOL/HDL RATIO
Cholesterol, Total: 212 mg/dL — ABNORMAL HIGH (ref 100–199)
HDL: 59 mg/dL (ref >39)
LDL Chol Calc (NIH): 130 mg/dL — ABNORMAL HIGH (ref 0–99)
Triglycerides: 128 mg/dL (ref 0–149)
VLDL Cholesterol Cal: 23 mg/dL (ref 5–40)

## 2019-12-22 LAB — TSH: TSH: 5.69 u[IU]/mL — ABNORMAL HIGH (ref 0.450–4.500)

## 2019-12-22 MED ORDER — ESCITALOPRAM OXALATE 20 MG PO TABS: ORAL_TABLET | ORAL | 1 refills | Status: DC

## 2019-12-22 MED ORDER — AMPHETAMINE-DEXTROAMPHET ER 30 MG PO CP24
30.0000 mg | ORAL_CAPSULE | ORAL | 0 refills | Status: DC
Start: 2020-01-21 — End: 2020-03-23

## 2019-12-22 MED ORDER — BUSPIRONE HCL 5 MG PO TABS: 5.0000 mg | ORAL_TABLET | Freq: Three times a day (TID) | ORAL | 1 refills | Status: DC | PRN

## 2019-12-22 MED ORDER — METOPROLOL SUCCINATE ER 25 MG PO TB24: ORAL_TABLET | ORAL | 1 refills | Status: DC

## 2019-12-22 MED ORDER — AMPHETAMINE-DEXTROAMPHET ER 30 MG PO CP24: 30.0000 mg | ORAL_CAPSULE | ORAL | 0 refills | Status: DC

## 2019-12-22 MED ORDER — AMPHETAMINE-DEXTROAMPHET ER 5 MG PO CP24
5.0000 mg | ORAL_CAPSULE | Freq: Every day | ORAL | 0 refills | Status: DC
Start: 2020-02-20 — End: 2020-03-23

## 2019-12-22 MED ORDER — DICYCLOMINE HCL 10 MG PO CAPS: 10.0000 mg | ORAL_CAPSULE | Freq: Three times a day (TID) | ORAL | 3 refills | Status: DC | PRN

## 2019-12-22 MED ORDER — AMPHETAMINE-DEXTROAMPHET ER 5 MG PO CP24
5.0000 mg | ORAL_CAPSULE | Freq: Every day | ORAL | 0 refills | Status: DC
Start: 2020-01-21 — End: 2020-03-23

## 2019-12-22 MED ORDER — OMEPRAZOLE 20 MG PO CPDR: DELAYED_RELEASE_CAPSULE | ORAL | 1 refills | Status: DC

## 2019-12-22 MED ORDER — ATOMOXETINE HCL 80 MG PO CAPS: 80.0000 mg | ORAL_CAPSULE | Freq: Every day | ORAL | 1 refills | Status: DC

## 2019-12-22 MED ORDER — AMPHETAMINE-DEXTROAMPHET ER 30 MG PO CP24
30.0000 mg | ORAL_CAPSULE | ORAL | 0 refills | Status: DC
Start: 2020-02-20 — End: 2020-03-23

## 2019-12-22 MED ORDER — AMPHETAMINE-DEXTROAMPHET ER 5 MG PO CP24: 5.0000 mg | ORAL_CAPSULE | Freq: Every day | ORAL | 0 refills | Status: DC

## 2019-12-22 MED ORDER — BUPROPION HCL ER (XL) 150 MG PO TB24: ORAL_TABLET | ORAL | 1 refills | Status: DC

## 2019-12-22 NOTE — Assessment & Plan Note (Signed)
Under good control on current regimen. Continue current regimen. Continue to monitor. Call with any concerns. Refills given. Labs drawn today.   

## 2019-12-22 NOTE — Assessment & Plan Note (Signed)
Continue diet and exercise. Call with any concerns. Continue to monitor.  

## 2019-12-22 NOTE — Assessment & Plan Note (Signed)
Rechecking levels today. Await results. Call with any concerns.  

## 2019-12-22 NOTE — Assessment & Plan Note (Signed)
Doing very well, would like to decrease her wellbutrin. Will cut down to 150mg  and recheck 3 months. Call with any concerns.

## 2019-12-22 NOTE — Assessment & Plan Note (Signed)
Under good control on current regimen. Continue current regimen. Continue to monitor. Call with any concerns. Refills given for 3 months. Follow up in 3 months.   

## 2019-12-22 NOTE — Assessment & Plan Note (Signed)
Rechecking levels today. Await results. Treat as needed.  

## 2019-12-24 ENCOUNTER — Encounter: Payer: Self-pay | Admitting: Family Medicine

## 2020-01-14 ENCOUNTER — Ambulatory Visit: Payer: BC Managed Care – PPO | Admitting: Family Medicine

## 2020-01-19 ENCOUNTER — Other Ambulatory Visit: Payer: BC Managed Care – PPO

## 2020-01-21 ENCOUNTER — Encounter: Payer: Self-pay | Admitting: Family Medicine

## 2020-01-26 ENCOUNTER — Encounter: Payer: Self-pay | Admitting: Family Medicine

## 2020-01-27 MED ORDER — DICYCLOMINE HCL 10 MG PO CAPS: 10.0000 mg | ORAL_CAPSULE | Freq: Three times a day (TID) | ORAL | 3 refills | Status: DC | PRN

## 2020-01-27 MED ORDER — BUSPIRONE HCL 5 MG PO TABS: 5.0000 mg | ORAL_TABLET | Freq: Three times a day (TID) | ORAL | 1 refills | Status: DC | PRN

## 2020-01-27 MED ORDER — LEVOTHYROXINE SODIUM 100 MCG PO TABS: ORAL_TABLET | ORAL | 0 refills | Status: DC

## 2020-01-27 MED ORDER — ATOMOXETINE HCL 80 MG PO CAPS: 80.0000 mg | ORAL_CAPSULE | Freq: Every day | ORAL | 1 refills | Status: DC

## 2020-01-27 MED ORDER — MECLIZINE HCL 25 MG PO TABS: 25.0000 mg | ORAL_TABLET | Freq: Three times a day (TID) | ORAL | 0 refills | Status: DC | PRN

## 2020-01-27 MED ORDER — OMEPRAZOLE 20 MG PO CPDR: DELAYED_RELEASE_CAPSULE | ORAL | 1 refills | Status: DC

## 2020-01-27 MED ORDER — BUPROPION HCL ER (XL) 150 MG PO TB24: ORAL_TABLET | ORAL | 1 refills | Status: DC

## 2020-01-27 MED ORDER — METOPROLOL SUCCINATE ER 25 MG PO TB24: ORAL_TABLET | ORAL | 1 refills | Status: DC

## 2020-01-27 MED ORDER — ESCITALOPRAM OXALATE 20 MG PO TABS: ORAL_TABLET | ORAL | 1 refills | Status: DC

## 2020-01-28 ENCOUNTER — Other Ambulatory Visit (INDEPENDENT_AMBULATORY_CARE_PROVIDER_SITE_OTHER): Payer: BC Managed Care – PPO

## 2020-01-28 ENCOUNTER — Other Ambulatory Visit: Payer: Self-pay

## 2020-01-28 DIAGNOSIS — E039 Hypothyroidism, unspecified: Secondary | ICD-10-CM | POA: Diagnosis not present

## 2020-01-28 DIAGNOSIS — R7989 Other specified abnormal findings of blood chemistry: Secondary | ICD-10-CM | POA: Diagnosis not present

## 2020-01-29 ENCOUNTER — Other Ambulatory Visit: Payer: Self-pay | Admitting: Nurse Practitioner

## 2020-01-29 LAB — TSH: TSH: 3.92 u[IU]/mL (ref 0.450–4.500)

## 2020-01-29 MED ORDER — BUPROPION HCL ER (XL) 150 MG PO TB24: ORAL_TABLET | ORAL | 1 refills | Status: DC

## 2020-02-02 ENCOUNTER — Other Ambulatory Visit: Payer: Self-pay | Admitting: Family Medicine

## 2020-02-09 ENCOUNTER — Encounter: Payer: Self-pay | Admitting: Family Medicine

## 2020-03-23 ENCOUNTER — Encounter: Payer: Self-pay | Admitting: Family Medicine

## 2020-03-23 ENCOUNTER — Telehealth (INDEPENDENT_AMBULATORY_CARE_PROVIDER_SITE_OTHER): Payer: BC Managed Care – PPO | Admitting: Family Medicine

## 2020-03-23 DIAGNOSIS — F901 Attention-deficit hyperactivity disorder, predominantly hyperactive type: Secondary | ICD-10-CM | POA: Diagnosis not present

## 2020-03-23 DIAGNOSIS — R150 Incomplete defecation: Secondary | ICD-10-CM | POA: Diagnosis not present

## 2020-03-23 MED ORDER — AMPHETAMINE-DEXTROAMPHET ER 20 MG PO CP24
20.0000 mg | ORAL_CAPSULE | ORAL | 0 refills | Status: DC
Start: 2020-03-23 — End: 2020-04-04

## 2020-03-23 NOTE — Progress Notes (Signed)
There were no vitals taken for this visit.   Subjective:    Patient ID: Pamela Avila, female    DOB: 08-29-1970, 49 y.o.   MRN: [id number]  HPI: Pamela Avila is a 49 y.o. female  Chief Complaint  Patient presents with  . ADHD   Has been having some anger/anxiety issues, but it seems to have calmed down, and she is feeling more like herself.   ADHD FOLLOW UP- she doesn't feel like her focus is doing great. She has lost things.  ADHD status: stable Satisfied with current therapy: yes Medication compliance:  good compliance Controlled substance contract: yes Previous psychiatry evaluation: no Previous medications: yes    Taking meds on weekends/vacations: yes Work/school performance:  good Difficulty sustaining attention/completing tasks: yes Distracted by extraneous stimuli: no Does not listen when spoken to: no  Fidgets with hands or feet: no Unable to stay in seat: no Blurts out/interrupts others: no ADHD Medication Side Effects: no    Decreased appetite: no    Headache: no    Sleeping disturbance pattern: no    Irritability: no    Rebound effects (worse than baseline) off medication: no    Anxiousness: no    Dizziness: no    Tics: no  She notes that she has had some episodes of fecal leaking during orgasm. This has happened over the past few weeks. No incontinence at any other time. It is leakage rather than fully formed stool. No other concerns or complaints at this time.   Relevant past medical, surgical, family and social history reviewed and updated as indicated. Interim medical history since our last visit reviewed. Allergies and medications reviewed and updated.  Review of Systems  Constitutional: Negative.   Respiratory: Negative.   Cardiovascular: Negative.   Gastrointestinal: Negative.   Musculoskeletal: Negative.   Neurological: Negative.   Psychiatric/Behavioral: Negative.     Per HPI unless specifically indicated above     Objective:    There  were no vitals taken for this visit.  Wt Readings from Last 3 Encounters:  12/21/19 170 lb 12.8 oz (77.5 kg)  09/29/19 170 lb (77.1 kg)  06/29/19 186 lb (84.4 kg)    Physical Exam Vitals and nursing note reviewed.  Constitutional:      General: She is not in acute distress.    Appearance: Normal appearance. She is not ill-appearing, toxic-appearing or diaphoretic.  HENT:     Head: Normocephalic and atraumatic.     Right Ear: External ear normal.     Left Ear: External ear normal.     Nose: Nose normal.     Mouth/Throat:     Mouth: Mucous membranes are moist.     Pharynx: Oropharynx is clear.  Eyes:     General: No scleral icterus.       Right eye: No discharge.        Left eye: No discharge.     Conjunctiva/sclera: Conjunctivae normal.     Pupils: Pupils are equal, round, and reactive to light.  Pulmonary:     Effort: Pulmonary effort is normal. No respiratory distress.     Comments: Speaking in full sentences Musculoskeletal:        General: Normal range of motion.     Cervical back: Normal range of motion.  Skin:    Coloration: Skin is not jaundiced or pale.     Findings: No bruising, erythema, lesion or rash.  Neurological:     Mental Status: She is alert  and oriented to person, place, and time. Mental status is at baseline.  Psychiatric:        Mood and Affect: Mood normal.        Behavior: Behavior normal.        Thought Content: Thought content normal.        Judgment: Judgment normal.     Results for orders placed or performed in visit on 01/28/20  TSH  Result Value Ref Range   TSH 3.920 0.450 - 4.500 uIU/mL      Assessment & Plan:   Problem List Items Addressed This Visit      Other   ADHD (attention deficit hyperactivity disorder) - Primary    Not doing great. Will increase to 40mg  daily and recheck 1 month. Call with any concerns.        Other Visit Diagnoses    Incomplete defecation       Advised to deficate prior to sexual intercourse, if not  improving, let me know.        Follow up plan: Return in about 4 weeks (around 04/20/2020).   . This visit was completed via MyChart due to the restrictions of the COVID-19 pandemic. All issues as above were discussed and addressed. Physical exam was done as above through visual confirmation on MyChart. If it was felt that the patient should be evaluated in the office, they were directed there. The patient verbally consented to this visit. . Location of the patient: parking lot . Location of the provider: work . Those involved with this call:  . Provider: 06/20/2020, DO . CMA: Olevia Perches, RMA . Front Desk/Registration: Floydene Flock  . Time spent on call: 15 minutes with patient face to face via video conference. More than 50% of this time was spent in counseling and coordination of care. 23 minutes total spent in review of patient's record and preparation of their chart.

## 2020-03-23 NOTE — Assessment & Plan Note (Signed)
Not doing great. Will increase to 40mg  daily and recheck 1 month. Call with any concerns.

## 2020-03-29 ENCOUNTER — Encounter: Payer: Self-pay | Admitting: Family Medicine

## 2020-04-04 ENCOUNTER — Other Ambulatory Visit: Payer: Self-pay | Admitting: Family Medicine

## 2020-04-04 MED ORDER — AMPHETAMINE-DEXTROAMPHET ER 20 MG PO CP24
40.0000 mg | ORAL_CAPSULE | ORAL | 0 refills | Status: DC
Start: 2020-04-04 — End: 2020-04-28

## 2020-04-05 ENCOUNTER — Encounter: Payer: Self-pay | Admitting: Family Medicine

## 2020-04-05 MED ORDER — MECLIZINE HCL 25 MG PO TABS: 25.0000 mg | ORAL_TABLET | Freq: Three times a day (TID) | ORAL | 0 refills | Status: DC | PRN

## 2020-04-25 ENCOUNTER — Other Ambulatory Visit: Payer: Self-pay

## 2020-04-25 NOTE — Telephone Encounter (Signed)
Pharmacy sent a fax regarding patients Adderall XR prescription. It states that insurance limits 1 capsule per day. Can this be changed to a 40 mg capsule daily instead of 20 mg, 2 daily? Does it even come in that dose?

## 2020-04-25 NOTE — Telephone Encounter (Signed)
I don't think it comes in that dose. Can we check with the pharmacy?

## 2020-04-25 NOTE — Telephone Encounter (Signed)
Called [facility], medication does not come in 40 mg. Does come in 30 mg and 10 mg though. Could we possibly write for those 2 doses to equal the 40 mg? Or do a PA on the quantity limit?

## 2020-04-28 MED ORDER — AMPHETAMINE-DEXTROAMPHET ER 10 MG PO CP24: ORAL_CAPSULE | ORAL | 0 refills | Status: DC

## 2020-04-28 MED ORDER — AMPHETAMINE-DEXTROAMPHET ER 30 MG PO CP24: ORAL_CAPSULE | ORAL | 0 refills | Status: DC

## 2020-04-28 NOTE — Telephone Encounter (Signed)
Called and cancelled 20 mg capsule RX. Pharmacy needs [place] to resend the 30 mg capsules because the directions are wrong. Will fix, please resend.   Patient notified of change via mychart.

## 2020-04-28 NOTE — Telephone Encounter (Signed)
30 and 10 sent through. Please cancel 20mg  at the pharmacy

## 2020-05-03 MED ORDER — AMPHETAMINE-DEXTROAMPHET ER 30 MG PO CP24
ORAL_CAPSULE | ORAL | 0 refills | Status: DC
Start: 2020-05-03 — End: 2020-06-21

## 2020-05-23 ENCOUNTER — Other Ambulatory Visit: Payer: Self-pay | Admitting: Family Medicine

## 2020-05-23 NOTE — Telephone Encounter (Signed)
Routing to provider. Called pharmacy to clarify. The last RX on 04/28/20 was received and picked up. Next one should not be due until 05/27/20.

## 2020-05-30 NOTE — Telephone Encounter (Signed)
RX is due for refill.

## 2020-05-31 NOTE — Telephone Encounter (Signed)
She's overdue for a follow up

## 2020-05-31 NOTE — Telephone Encounter (Signed)
Called and spoke with [facility]. Confirmed again that medication was received on 04/28/20 and was picked up by the patient on 04/29/20. Patient is due for the refill has it has been just over 30 days.

## 2020-05-31 NOTE — Telephone Encounter (Signed)
Please call and schedule f/up per Dr. Laural Benes. Patient was due 04/20/20.

## 2020-06-01 NOTE — Telephone Encounter (Signed)
Pt would like to speak with some one in clinical staff stated the medication on mychart is showing a start date of 04/28/20.I did explain to her that on our side of her chart that was not showing as a start date for her medication. Was able to schedule apt for 06/21/2020. Pt still confused and would like a call from clinical staff

## 2020-06-21 ENCOUNTER — Ambulatory Visit (INDEPENDENT_AMBULATORY_CARE_PROVIDER_SITE_OTHER): Payer: BC Managed Care – PPO | Admitting: Family Medicine

## 2020-06-21 ENCOUNTER — Encounter: Payer: Self-pay | Admitting: Family Medicine

## 2020-06-21 VITALS — BP 126/86 | Wt 181.0 lb

## 2020-06-21 DIAGNOSIS — I10 Essential (primary) hypertension: Secondary | ICD-10-CM | POA: Diagnosis not present

## 2020-06-21 DIAGNOSIS — F901 Attention-deficit hyperactivity disorder, predominantly hyperactive type: Secondary | ICD-10-CM | POA: Diagnosis not present

## 2020-06-21 DIAGNOSIS — M25562 Pain in left knee: Secondary | ICD-10-CM | POA: Diagnosis not present

## 2020-06-21 DIAGNOSIS — F32A Depression, unspecified: Secondary | ICD-10-CM

## 2020-06-21 DIAGNOSIS — K219 Gastro-esophageal reflux disease without esophagitis: Secondary | ICD-10-CM | POA: Diagnosis not present

## 2020-06-21 DIAGNOSIS — F419 Anxiety disorder, unspecified: Secondary | ICD-10-CM

## 2020-06-21 DIAGNOSIS — M25862 Other specified joint disorders, left knee: Secondary | ICD-10-CM | POA: Diagnosis not present

## 2020-06-21 MED ORDER — ATOMOXETINE HCL 80 MG PO CAPS
80.0000 mg | ORAL_CAPSULE | Freq: Every day | ORAL | 1 refills | Status: DC
Start: 2020-06-21 — End: 2020-12-29

## 2020-06-21 MED ORDER — BUPROPION HCL ER (XL) 150 MG PO TB24
ORAL_TABLET | ORAL | 1 refills | Status: DC
Start: 2020-06-21 — End: 2020-12-29

## 2020-06-21 MED ORDER — AMPHETAMINE-DEXTROAMPHET ER 10 MG PO CP24: 10.0000 mg | ORAL_CAPSULE | Freq: Every day | ORAL | 0 refills | Status: DC

## 2020-06-21 MED ORDER — OMEPRAZOLE 20 MG PO CPDR
DELAYED_RELEASE_CAPSULE | ORAL | 1 refills | Status: DC
Start: 2020-06-21 — End: 2020-12-29

## 2020-06-21 MED ORDER — METOPROLOL SUCCINATE ER 25 MG PO TB24
ORAL_TABLET | ORAL | 1 refills | Status: DC
Start: 2020-06-21 — End: 2020-12-29

## 2020-06-21 MED ORDER — DICYCLOMINE HCL 10 MG PO CAPS: 10.0000 mg | ORAL_CAPSULE | Freq: Three times a day (TID) | ORAL | 1 refills | Status: DC | PRN

## 2020-06-21 MED ORDER — BUSPIRONE HCL 5 MG PO TABS: 5.0000 mg | ORAL_TABLET | Freq: Three times a day (TID) | ORAL | 1 refills | Status: DC | PRN

## 2020-06-21 MED ORDER — LEVOTHYROXINE SODIUM 100 MCG PO TABS
ORAL_TABLET | ORAL | 3 refills | Status: DC
Start: 2020-06-21 — End: 2021-02-02

## 2020-06-21 MED ORDER — AMPHETAMINE-DEXTROAMPHET ER 30 MG PO CP24: 30.0000 mg | ORAL_CAPSULE | ORAL | 0 refills | Status: DC

## 2020-06-21 MED ORDER — AMPHETAMINE-DEXTROAMPHET ER 30 MG PO CP24: 30.0000 mg | ORAL_CAPSULE | Freq: Every day | ORAL | 0 refills | Status: DC

## 2020-06-21 MED ORDER — ESCITALOPRAM OXALATE 20 MG PO TABS
20.0000 mg | ORAL_TABLET | Freq: Every day | ORAL | 1 refills | Status: DC
Start: 2020-06-21 — End: 2020-12-29

## 2020-06-21 NOTE — Assessment & Plan Note (Signed)
Under good control on current regimen. Continue current regimen. Continue to monitor. Call with any concerns. Refills given for 3 months. Follow up 3 months.    

## 2020-06-21 NOTE — Assessment & Plan Note (Signed)
Under good control on current regimen. Continue current regimen. Continue to monitor. Call with any concerns. Refills given.   

## 2020-06-21 NOTE — Progress Notes (Signed)
BP 126/86   Wt 181 lb (82.1 kg)   BMI 34.62 kg/m    Subjective:    Patient ID: Pamela Avila, female    DOB: 1971/07/21, 49 y.o.   MRN: [id number]  HPI: Pamela Avila is a 49 y.o. female  Chief Complaint  Patient presents with  . ADHD   ADHD FOLLOW UP ADHD status: better Satisfied with current therapy: yes Medication compliance:  good compliance Controlled substance contract: yes Previous psychiatry evaluation: yes Previous medications: yes    Taking meds on weekends/vacations: yes Work/school performance:  excellent Difficulty sustaining attention/completing tasks: no Distracted by extraneous stimuli: no Does not listen when spoken to: no  Fidgets with hands or feet: no Unable to stay in seat: no Blurts out/interrupts others: no ADHD Medication Side Effects: no    Decreased appetite: no    Headache: no    Sleeping disturbance pattern: no    Irritability: no    Rebound effects (worse than baseline) off medication: no    Anxiousness: no    Dizziness: no    Tics: no  HYPERTENSION Hypertension status: controlled  Satisfied with current treatment? yes Duration of hypertension: chronic BP monitoring frequency:  not checking BP medication side effects:  no Medication compliance: excellent compliance Previous BP meds: metoprolol Aspirin: no Recurrent headaches: no Visual changes: no Palpitations: no Dyspnea: no Chest pain: no Lower extremity edema: no Dizzy/lightheaded: no  DEPRESSION Mood status: controlled Satisfied with current treatment?: yes Symptom severity: mild  Duration of current treatment : chronic Side effects: no Medication compliance: excellent compliance Psychotherapy/counseling: no  Previous psychiatric medications: wellbutrin, lexapro Depressed mood: no Anxious mood: no Anhedonia: no Significant weight loss or gain: no Insomnia: no  Fatigue: no Feelings of worthlessness or guilt: no Impaired concentration/indecisiveness: no Suicidal  ideations: no Hopelessness: no Crying spells: no Depression screen [facility] 2/9 03/23/2020 12/21/2019 06/29/2019 03/12/2019 01/30/2019  Decreased Interest 0 0 1 1 2   Down, Depressed, Hopeless 0 0 1 1 2   PHQ - 2 Score 0 0 2 2 4   Altered sleeping 0 1 1 1 3   Tired, decreased energy 0 1 2 1 2   Change in appetite 0 1 0 0 0  Feeling bad or failure about yourself  0 0 0 1 0  Trouble concentrating 3 2 2 2 2   Moving slowly or fidgety/restless 1 1 1  0 1  Suicidal thoughts 0 0 0 0 0  PHQ-9 Score 4 6 8 7 12   Difficult doing work/chores - Somewhat difficult Somewhat difficult Somewhat difficult Very difficult  Some recent data might be hidden   GERD GERD control status: controlled Satisfied with current treatment? yes Heartburn frequency:rarely  Medication side effects: no  Medication compliance: excellent Dysphagia: no Odynophagia:  no Hematemesis: no Blood in stool: no EGD: no  Relevant past medical, surgical, family and social history reviewed and updated as indicated. Interim medical history since our last visit reviewed. Allergies and medications reviewed and updated.  Review of Systems  Constitutional: Negative.   Respiratory: Negative.   Cardiovascular: Negative.   Gastrointestinal: Negative.   Musculoskeletal: Negative.   Neurological: Negative.   Psychiatric/Behavioral: Negative.     Per HPI unless specifically indicated above     Objective:    BP 126/86   Wt 181 lb (82.1 kg)   BMI 34.62 kg/m   Wt Readings from Last 3 Encounters:  06/21/20 181 lb (82.1 kg)  12/21/19 170 lb 12.8 oz (77.5 kg)  09/29/19 170 lb (77.1 kg)  Physical Exam Vitals and nursing note reviewed.  Constitutional:      General: She is not in acute distress.    Appearance: Normal appearance. She is not ill-appearing, toxic-appearing or diaphoretic.  HENT:     Head: Normocephalic and atraumatic.     Right Ear: External ear normal.     Left Ear: External ear normal.     Nose: Nose normal.      Mouth/Throat:     Mouth: Mucous membranes are moist.     Pharynx: Oropharynx is clear.  Eyes:     General: No scleral icterus.       Right eye: No discharge.        Left eye: No discharge.     Conjunctiva/sclera: Conjunctivae normal.     Pupils: Pupils are equal, round, and reactive to light.  Pulmonary:     Effort: Pulmonary effort is normal. No respiratory distress.     Comments: Speaking in full sentences Musculoskeletal:        General: Normal range of motion.     Cervical back: Normal range of motion.  Skin:    Coloration: Skin is not jaundiced or pale.     Findings: No bruising, erythema, lesion or rash.  Neurological:     Mental Status: She is alert and oriented to person, place, and time. Mental status is at baseline.  Psychiatric:        Mood and Affect: Mood normal.        Behavior: Behavior normal.        Thought Content: Thought content normal.        Judgment: Judgment normal.     Results for orders placed or performed in visit on 01/28/20  TSH  Result Value Ref Range   TSH 3.920 0.450 - 4.500 uIU/mL      Assessment & Plan:   Problem List Items Addressed This Visit      Cardiovascular and Mediastinum   Essential hypertension, benign    Under good control on current regimen. Continue current regimen. Continue to monitor. Call with any concerns. Refills given.        Relevant Medications   metoprolol succinate (TOPROL-XL) 25 MG 24 hr tablet     Digestive   GERD (gastroesophageal reflux disease)    Under good control on current regimen. Continue current regimen. Continue to monitor. Call with any concerns. Refills given.        Relevant Medications   dicyclomine (BENTYL) 10 MG capsule   omeprazole (PRILOSEC) 20 MG capsule     Other   ADHD (attention deficit hyperactivity disorder) - Primary    Under good control on current regimen. Continue current regimen. Continue to monitor. Call with any concerns. Refills given for 3 months. Follow up 3 months.         Anxiety and depression    Under good control on current regimen. Continue current regimen. Continue to monitor. Call with any concerns. Refills given.        Relevant Medications   buPROPion (WELLBUTRIN XL) 150 MG 24 hr tablet   busPIRone (BUSPAR) 5 MG tablet   escitalopram (LEXAPRO) 20 MG tablet       Follow up plan: Return in about 3 months (around 09/21/2020) for ADHD follow up.   . This visit was completed via MyChart due to the restrictions of the COVID-19 pandemic. All issues as above were discussed and addressed. Physical exam was done as above through visual confirmation on MyChart. If it was  felt that the patient should be evaluated in the office, they were directed there. The patient verbally consented to this visit. . Location of the patient: home . Location of the provider: work . Those involved with this call:  . Provider: Olevia Perches, DO . CMA: Anson Fret, CMA . Front Desk/Registration: Harriet Pho  . Time spent on call: 25 minutes with patient face to face via video conference. More than 50% of this time was spent in counseling and coordination of care. 40 minutes total spent in review of patient's record and preparation of their chart.

## 2020-06-22 ENCOUNTER — Encounter: Payer: Self-pay | Admitting: Family Medicine

## 2020-06-28 ENCOUNTER — Telehealth: Payer: Self-pay

## 2020-06-28 NOTE — Telephone Encounter (Signed)
lvm to make this 3 month f.u  

## 2020-06-28 NOTE — Telephone Encounter (Signed)
-----   Message from Dorcas Carrow, DO sent at 06/21/2020  2:46 PM EST ----- 3 months

## 2020-07-11 NOTE — Telephone Encounter (Signed)
lvm to make this 3 month f.u  

## 2020-08-30 ENCOUNTER — Encounter: Payer: Self-pay | Admitting: Family Medicine

## 2020-08-30 ENCOUNTER — Telehealth (INDEPENDENT_AMBULATORY_CARE_PROVIDER_SITE_OTHER): Payer: BC Managed Care – PPO | Admitting: Family Medicine

## 2020-08-30 VITALS — Temp 103.0°F

## 2020-08-30 DIAGNOSIS — Z20822 Contact with and (suspected) exposure to covid-19: Secondary | ICD-10-CM | POA: Diagnosis not present

## 2020-08-30 MED ORDER — LIDOCAINE VISCOUS HCL 2 % MT SOLN: 15.0000 mL | OROMUCOSAL | 0 refills | Status: DC | PRN

## 2020-08-30 MED ORDER — HYDROCOD POLST-CPM POLST ER 10-8 MG/5ML PO SUER: 5.0000 mL | Freq: Two times a day (BID) | ORAL | 0 refills | Status: DC | PRN

## 2020-08-30 MED ORDER — BENZONATATE 200 MG PO CAPS: 200.0000 mg | ORAL_CAPSULE | Freq: Two times a day (BID) | ORAL | 0 refills | Status: DC | PRN

## 2020-08-30 MED ORDER — PREDNISONE 50 MG PO TABS: 50.0000 mg | ORAL_TABLET | Freq: Every day | ORAL | 0 refills | Status: DC

## 2020-08-30 NOTE — Addendum Note (Signed)
Addended by: Pablo Ledger on: 08/30/2020 04:07 PM   Modules accepted: Orders

## 2020-08-30 NOTE — Progress Notes (Signed)
Temp (!) 103 F (39.4 C)    Subjective:    Patient ID: Pamela Avila, female    DOB: [birth year], 50 y.o.   MRN: [id number]  HPI: Pamela Avila is a 50 y.o. female  Chief Complaint  Patient presents with  . Fever    Pt started feeling sick on 1/16. Patient is having sore throat, ear pain and chest congestion.    UPPER RESPIRATORY TRACT INFECTION Duration: 3 days Fever: yes Cough: yes Shortness of breath: no Wheezing: yes Chest pain: no Chest tightness: yes Chest congestion: no Nasal congestion: yes Runny nose: no Post nasal drip: yes Sneezing: no Sore throat: yes Swollen glands: no Sinus pressure: no Headache: yes Face pain: no Toothache: yes Ear pain: yes bilateral Ear pressure: yes bilateral Eyes red/itching:no Eye drainage/crusting: no  Vomiting: no Rash: no Fatigue: yes Sick contacts: yes Strep contacts: no  Context: worse Recurrent sinusitis: no Relief with OTC cold/cough medications: no  Treatments attempted: tylenol and ibuprofen   Relevant past medical, surgical, family and social history reviewed and updated as indicated. Interim medical history since our last visit reviewed. Allergies and medications reviewed and updated.  Review of Systems  Constitutional: Positive for chills, diaphoresis, fatigue and fever. Negative for activity change, appetite change and unexpected weight change.  HENT: Positive for congestion, postnasal drip and sore throat. Negative for dental problem, drooling, ear discharge, ear pain, facial swelling, hearing loss, mouth sores, nosebleeds, rhinorrhea, sinus pressure, sinus pain, sneezing, tinnitus, trouble swallowing and voice change.   Eyes: Negative.   Respiratory: Positive for cough and wheezing. Negative for apnea, choking, chest tightness, shortness of breath and stridor.   Cardiovascular: Negative.   Gastrointestinal: Negative.   Musculoskeletal: Negative.   Psychiatric/Behavioral: Negative.     Per HPI unless  specifically indicated above     Objective:    Temp (!) 103 F (39.4 C)   Wt Readings from Last 3 Encounters:  06/21/20 181 lb (82.1 kg)  12/21/19 170 lb 12.8 oz (77.5 kg)  09/29/19 170 lb (77.1 kg)    Physical Exam Vitals and nursing note reviewed.  Pulmonary:     Effort: Pulmonary effort is normal. No respiratory distress.     Comments: Speaking in full sentences Neurological:     Mental Status: She is alert.  Psychiatric:        Mood and Affect: Mood normal.        Behavior: Behavior normal.        Thought Content: Thought content normal.        Judgment: Judgment normal.     Results for orders placed or performed in visit on 01/28/20  TSH  Result Value Ref Range   TSH 3.920 0.450 - 4.500 uIU/mL      Assessment & Plan:   Problem List Items Addressed This Visit   None   Visit Diagnoses    Suspected COVID-19 virus infection    -  Primary   Will get ber swabbed. Self-quarantine until results are back. Treat symptomatically as below. Call with any concerns or not getting better.    Relevant Orders   Novel Coronavirus, NAA ([facility])       Follow up plan: Return if symptoms worsen or fail to improve.   . This visit was completed via telephone due to the restrictions of the COVID-19 pandemic. All issues as above were discussed and addressed but no physical exam was performed. If it was felt that the patient should be evaluated in  the office, they were directed there. The patient verbally consented to this visit. Patient was unable to complete an audio/visual visit due to Lack of equipment. Due to the catastrophic nature of the COVID-19 pandemic, this visit was done through audio contact only. . Location of the patient: home . Location of the provider: home . Those involved with this call:  . Provider: Olevia Perches, DO . CMA: Rolley Sims, CMA . Front Desk/Registration: Harriet Pho  . Time spent on call: 21 minutes on the phone discussing health concerns. 30  minutes total spent in review of patient's record and preparation of their chart.

## 2020-09-01 ENCOUNTER — Encounter: Payer: Self-pay | Admitting: Family Medicine

## 2020-09-01 LAB — SARS-COV-2, NAA 2 DAY TAT

## 2020-09-01 LAB — NOVEL CORONAVIRUS, NAA: SARS-CoV-2, NAA: DETECTED — AB

## 2020-09-02 ENCOUNTER — Telehealth: Payer: Self-pay

## 2020-09-02 NOTE — Telephone Encounter (Signed)
Called to discuss with patient about COVID-19 symptoms and the use of one of the available treatments for those with mild to moderate Covid symptoms and at a high risk of hospitalization.  Pt appears to qualify for outpatient treatment due to co-morbid conditions and/or a member of an at-risk group in accordance with the FDA Emergency Use Authorization.    Symptom onset: Unknown Vaccinated: Unknown Booster? Unknown Immunocompromised? No Qualifiers: HTN  Unable to reach pt - Voice mailbox is full, unable to leave message.   Esther Hardy

## 2020-09-03 ENCOUNTER — Encounter: Payer: Self-pay | Admitting: Family Medicine

## 2020-09-04 ENCOUNTER — Encounter: Payer: Self-pay | Admitting: Family Medicine

## 2020-09-07 ENCOUNTER — Telehealth (INDEPENDENT_AMBULATORY_CARE_PROVIDER_SITE_OTHER): Payer: BC Managed Care – PPO | Admitting: Nurse Practitioner

## 2020-09-07 ENCOUNTER — Encounter: Payer: Self-pay | Admitting: Nurse Practitioner

## 2020-09-07 DIAGNOSIS — U071 COVID-19: Secondary | ICD-10-CM | POA: Diagnosis not present

## 2020-09-07 MED ORDER — HYDROCOD POLST-CPM POLST ER 10-8 MG/5ML PO SUER: 5.0000 mL | Freq: Two times a day (BID) | ORAL | 0 refills | Status: DC | PRN

## 2020-09-07 MED ORDER — ALBUTEROL SULFATE HFA 108 (90 BASE) MCG/ACT IN AERS: 2.0000 | INHALATION_SPRAY | Freq: Four times a day (QID) | RESPIRATORY_TRACT | 0 refills | Status: DC | PRN

## 2020-09-07 NOTE — Assessment & Plan Note (Signed)
Acute and ongoing, on day 10-11 of symptoms with ongoing cough, wheezing, SOB.  Will obtain CXR to r/o Covid PNA, if present will treat accordingly.  Refills on Tussionex sent and script for Albuterol inhaler to use as needed.  Recommend continued cessation of smoking.  Is on day 8 of quarantine, recommend ongoing quarantine for 2 more days.  Recommend starting Vitamin C 500 MG twice a day, Quercetin 250-500 MG twice a day, Zinc 75-100 MG daily, and Vitamin D3 2000-4000 units daily to help.  May also use over the counter cold medications for symptom relief.  Ensure good hydration and rest.  Tylenol for fever if presents.  If worsening SOB or any CP presents immediately go to ER.  At this time return to office in one week for lung check.

## 2020-09-07 NOTE — Progress Notes (Signed)
There were no vitals taken for this visit.   Subjective:    Patient ID: Pamela Avila, female    DOB: October 13, 1970, 50 y.o.   MRN: [id number]  HPI: Pamela Avila is a 50 y.o. female  Chief Complaint  Patient presents with  . Covid Positive    Tested positive on 08/30/20, sore throat, cough, weak, fatigue, body aches, feels dehydrated and still having a head ache.    . This visit was completed via MyChart due to the restrictions of the COVID-19 pandemic. All issues as above were discussed and addressed. Physical exam was done as above through visual confirmation on MyChart. If it was felt that the patient should be evaluated in the office, they were directed there. The patient verbally consented to this visit. . Location of the patient: home . Location of the provider: work . Those involved with this call:  . Provider: Aura Dials, DNP . CMA: Tristan Schroeder, CMA . Front Desk/Registration: Harriet Pho  . Time spent on call: 21 minutes with patient face to face via video conference. More than 50% of this time was spent in counseling and coordination of care. 15 minutes total spent in review of patient's record and preparation of their chart.  . I verified patient identity using two factors (patient name and date of birth). Patient consents verbally to being seen via telemedicine visit today.   COVID POSITIVE Tested positive on 08/30/20, had visit with PCP.  Was given Prednisone, Tussionex, viscous Lidocaine, and Tessalon.  Symptoms started on 15th or 16th.  Unvaccinated.  Denies loss of taste or smell.  Reports ongoing cough symptoms and not feeling 100%.  Is at day 10-11 of symptoms onset, discussed with her she is past period for outpatient oral treatment or MAB. Fever: no Cough: yes Shortness of breath: yes Wheezing: yes Chest pain: no Chest tightness: yes Chest congestion: yes Nasal congestion: mild Runny nose: no Post nasal drip: no Sneezing: no Sore throat: yes, improved  though Swollen glands: no Sinus pressure: no Headache: yes Face pain: no Toothache: no Ear pain: none Ear pressure: none Eyes red/itching:no Eye drainage/crusting: no  Vomiting: no Rash: no Fatigue: yes Sick contacts: no Strep contacts: no  Context: fluctuating Recurrent sinusitis: no Relief with OTC cold/cough medications: yes  Treatments attempted: cold/sinus and cough syrup   Relevant past medical, surgical, family and social history reviewed and updated as indicated. Interim medical history since our last visit reviewed. Allergies and medications reviewed and updated.  Review of Systems  Constitutional: Positive for fatigue. Negative for activity change, appetite change, chills and fever.  HENT: Positive for congestion, postnasal drip and rhinorrhea. Negative for ear discharge, ear pain, facial swelling, sinus pressure, sinus pain, sneezing, sore throat and voice change.   Eyes: Negative for pain and visual disturbance.  Respiratory: Positive for cough, chest tightness, shortness of breath and wheezing.   Cardiovascular: Negative for chest pain, palpitations and leg swelling.  Gastrointestinal: Negative.   Musculoskeletal: Negative for myalgias.  Neurological: Positive for headaches. Negative for dizziness and numbness.  Psychiatric/Behavioral: Negative.     Per HPI unless specifically indicated above     Objective:    There were no vitals taken for this visit.  Wt Readings from Last 3 Encounters:  06/21/20 181 lb (82.1 kg)  12/21/19 170 lb 12.8 oz (77.5 kg)  09/29/19 170 lb (77.1 kg)    Physical Exam Vitals and nursing note reviewed.  Constitutional:      General: She is  awake. She is not in acute distress.    Appearance: She is well-developed and well-groomed. She is obese. She is ill-appearing. She is not toxic-appearing.  HENT:     Head: Normocephalic.     Right Ear: Hearing normal.     Left Ear: Hearing normal.  Eyes:     General: Lids are normal.         Right eye: No discharge.        Left eye: No discharge.     Conjunctiva/sclera: Conjunctivae normal.  Pulmonary:     Effort: Pulmonary effort is normal. No accessory muscle usage or respiratory distress.     Comments: Unable to auscultate due to virtual exam only.  No SOB with talking.  Intermittent cough present.  Hoarse sounding with nasal congestion present. Musculoskeletal:     Cervical back: Normal range of motion.  Neurological:     Mental Status: She is alert and oriented to person, place, and time.  Psychiatric:        Attention and Perception: Attention normal.        Mood and Affect: Mood normal.        Behavior: Behavior normal. Behavior is cooperative.        Thought Content: Thought content normal.        Judgment: Judgment normal.     Results for orders placed or performed in visit on 08/30/20  Novel Coronavirus, NAA ([facility])   Specimen: Saline  Result Value Ref Range   SARS-CoV-2, NAA Detected (A) Not Detected  SARS-COV-2, NAA 2 DAY TAT  Result Value Ref Range   SARS-CoV-2, NAA 2 DAY TAT Performed       Assessment & Plan:   Problem List Items Addressed This Visit      Other   Lab test positive for detection of COVID-19 virus - Primary    Acute and ongoing, on day 10-11 of symptoms with ongoing cough, wheezing, SOB.  Will obtain CXR to r/o Covid PNA, if present will treat accordingly.  Refills on Tussionex sent and script for Albuterol inhaler to use as needed.  Recommend continued cessation of smoking.  Is on day 8 of quarantine, recommend ongoing quarantine for 2 more days.  Recommend starting Vitamin C 500 MG twice a day, Quercetin 250-500 MG twice a day, Zinc 75-100 MG daily, and Vitamin D3 2000-4000 units daily to help.  May also use over the counter cold medications for symptom relief.  Ensure good hydration and rest.  Tylenol for fever if presents.  If worsening SOB or any CP presents immediately go to ER.  At this time return to office in one week for  lung check.        Relevant Orders   DG Chest 2 View      I discussed the assessment and treatment plan with the patient. The patient was provided an opportunity to ask questions and all were answered. The patient agreed with the plan and demonstrated an understanding of the instructions.   The patient was advised to call back or seek an in-person evaluation if the symptoms worsen or if the condition fails to improve as anticipated.   I provided 21+ minutes of time during this encounter.  Follow up plan: Return in about 1 week (around 09/14/2020) for Lung check in office with Dr. Shela Commons or me.

## 2020-09-07 NOTE — Patient Instructions (Signed)

## 2020-09-08 ENCOUNTER — Ambulatory Visit
Admission: RE | Admit: 2020-09-08 | Discharge: 2020-09-08 | Disposition: A | Discharge status: Home / Self Care | Payer: BC Managed Care – PPO | Source: Home / Self Care | Attending: Nurse Practitioner | Admitting: Nurse Practitioner

## 2020-09-08 ENCOUNTER — Other Ambulatory Visit: Payer: Self-pay

## 2020-09-08 ENCOUNTER — Ambulatory Visit
Admission: RE | Admit: 2020-09-08 | Discharge: 2020-09-08 | Disposition: A | Discharge status: Home / Self Care | Payer: BC Managed Care – PPO | Source: Ambulatory Visit | Attending: Nurse Practitioner | Admitting: Nurse Practitioner

## 2020-09-08 DIAGNOSIS — R0602 Shortness of breath: Secondary | ICD-10-CM | POA: Diagnosis not present

## 2020-09-08 DIAGNOSIS — J189 Pneumonia, unspecified organism: Secondary | ICD-10-CM | POA: Diagnosis not present

## 2020-09-08 DIAGNOSIS — U071 COVID-19: Secondary | ICD-10-CM

## 2020-09-08 DIAGNOSIS — R059 Cough, unspecified: Secondary | ICD-10-CM | POA: Diagnosis not present

## 2020-09-08 DIAGNOSIS — R062 Wheezing: Secondary | ICD-10-CM | POA: Diagnosis not present

## 2020-09-08 NOTE — Progress Notes (Signed)
Contacted via MyChart   Good evening Pamela Avila, your imaging has returned.  No pneumonia is present. Good news and happy belated birthday!!

## 2020-09-09 NOTE — Progress Notes (Signed)
Unable to lvm to make apt °

## 2020-09-26 ENCOUNTER — Other Ambulatory Visit: Payer: Self-pay

## 2020-09-26 ENCOUNTER — Encounter: Payer: Self-pay | Admitting: Family Medicine

## 2020-09-26 ENCOUNTER — Ambulatory Visit
Admission: RE | Admit: 2020-09-26 | Discharge: 2020-09-26 | Disposition: A | Discharge status: Home / Self Care | Payer: BC Managed Care – PPO | Source: Home / Self Care | Attending: Family Medicine | Admitting: Family Medicine

## 2020-09-26 ENCOUNTER — Ambulatory Visit (INDEPENDENT_AMBULATORY_CARE_PROVIDER_SITE_OTHER): Payer: BC Managed Care – PPO | Admitting: Family Medicine

## 2020-09-26 ENCOUNTER — Ambulatory Visit
Admission: RE | Admit: 2020-09-26 | Discharge: 2020-09-26 | Disposition: A | Discharge status: Home / Self Care | Payer: BC Managed Care – PPO | Source: Ambulatory Visit | Attending: Family Medicine | Admitting: Family Medicine

## 2020-09-26 VITALS — BP 127/85 | HR 102 | Temp 98.6°F | Wt 178.2 lb

## 2020-09-26 DIAGNOSIS — R079 Chest pain, unspecified: Secondary | ICD-10-CM

## 2020-09-26 DIAGNOSIS — R0602 Shortness of breath: Secondary | ICD-10-CM | POA: Diagnosis not present

## 2020-09-26 DIAGNOSIS — F901 Attention-deficit hyperactivity disorder, predominantly hyperactive type: Secondary | ICD-10-CM | POA: Diagnosis not present

## 2020-09-26 DIAGNOSIS — R059 Cough, unspecified: Secondary | ICD-10-CM | POA: Diagnosis not present

## 2020-09-26 MED ORDER — AMPHETAMINE-DEXTROAMPHET ER 10 MG PO CP24: 10.0000 mg | ORAL_CAPSULE | Freq: Every day | ORAL | 0 refills | Status: DC

## 2020-09-26 MED ORDER — AMPHETAMINE-DEXTROAMPHET ER 30 MG PO CP24: 30.0000 mg | ORAL_CAPSULE | ORAL | 0 refills | Status: DC

## 2020-09-26 NOTE — Progress Notes (Signed)
BP 127/85   Pulse (!) 102   Temp 98.6 F (37 C) (Oral)   Wt 178 lb 3.2 oz (80.8 kg)   SpO2 96%   BMI 34.08 kg/m    Subjective:    Patient ID: Pamela Avila, female    DOB: 29-Oct-1970, 50 y.o.   MRN: [id number]  HPI: Pamela Avila is a 50 y.o. female  Chief Complaint  Patient presents with  . ADHD    Pt states she having random headaches, she states they're not that bad just enough to be annoying. Pt denies having any dizziness.   . Medication Refill    Pt states she is here to discuss refill on medication.   Has recovered well from her COVID. She notes that she has had some continued congestion and some headaches. She is concerned about her heart. She notes that she has been having some upper back pain chronically, but seems to be worse in the last 3 weeks.   CHEST PAIN Duration: 3 weeks Onset: gradual Quality: pressure Severity: 2/10 Location: middle of upper back Radiation: left arm Episode duration: 3 days Frequency: intermittent Related to exertion: no Activity when pain started: was sick with COVID Trauma: no Anxiety/recent stressors: yes Status: stable Treatments attempted: antacids  Current pain status: pain free Shortness of breath: yes Cough: yes Nausea: no Diaphoresis: yes Heartburn: yes Palpitations: no  ADHD FOLLOW UP ADHD status: controlled Satisfied with current therapy: yes Medication compliance:  excellent compliance Controlled substance contract: yes Previous psychiatry evaluation: yes Previous medications: yes    Taking meds on weekends/vacations: yes Work/school performance:  excellent Difficulty sustaining attention/completing tasks: no Distracted by extraneous stimuli: no Does not listen when spoken to: no  Fidgets with hands or feet: no Unable to stay in seat: no Blurts out/interrupts others: no ADHD Medication Side Effects: no    Decreased appetite: no    Headache: no    Sleeping disturbance pattern: no    Irritability: no     Rebound effects (worse than baseline) off medication: no    Anxiousness: no    Dizziness: no    Tics: no  Relevant past medical, surgical, family and social history reviewed and updated as indicated. Interim medical history since our last visit reviewed. Allergies and medications reviewed and updated.  Review of Systems  Constitutional: Negative.   Respiratory: Positive for cough and shortness of breath. Negative for apnea, choking, chest tightness, wheezing and stridor.   Cardiovascular: Positive for chest pain. Negative for palpitations and leg swelling.  Gastrointestinal: Negative.   Musculoskeletal: Negative.   Neurological: Negative.   Psychiatric/Behavioral: Negative.     Per HPI unless specifically indicated above     Objective:    BP 127/85   Pulse (!) 102   Temp 98.6 F (37 C) (Oral)   Wt 178 lb 3.2 oz (80.8 kg)   SpO2 96%   BMI 34.08 kg/m   Wt Readings from Last 3 Encounters:  09/26/20 178 lb 3.2 oz (80.8 kg)  06/21/20 181 lb (82.1 kg)  12/21/19 170 lb 12.8 oz (77.5 kg)    Physical Exam Vitals and nursing note reviewed.  Constitutional:      General: She is not in acute distress.    Appearance: Normal appearance. She is not ill-appearing, toxic-appearing or diaphoretic.  HENT:     Head: Normocephalic and atraumatic.     Right Ear: External ear normal.     Left Ear: External ear normal.     Nose:  Nose normal.     Mouth/Throat:     Mouth: Mucous membranes are moist.     Pharynx: Oropharynx is clear.  Eyes:     General: No scleral icterus.       Right eye: No discharge.        Left eye: No discharge.     Extraocular Movements: Extraocular movements intact.     Conjunctiva/sclera: Conjunctivae normal.     Pupils: Pupils are equal, round, and reactive to light.  Cardiovascular:     Rate and Rhythm: Normal rate and regular rhythm.     Pulses: Normal pulses.     Heart sounds: Normal heart sounds. No murmur heard. No friction rub. No gallop.   Pulmonary:      Effort: Pulmonary effort is normal. No respiratory distress.     Breath sounds: Normal breath sounds. No stridor. No wheezing, rhonchi or rales.  Chest:     Chest wall: No tenderness.  Musculoskeletal:        General: Normal range of motion.     Cervical back: Normal range of motion and neck supple.  Skin:    General: Skin is warm and dry.     Capillary Refill: Capillary refill takes less than 2 seconds.     Coloration: Skin is not jaundiced or pale.     Findings: No bruising, erythema, lesion or rash.  Neurological:     General: No focal deficit present.     Mental Status: She is alert and oriented to person, place, and time. Mental status is at baseline.  Psychiatric:        Mood and Affect: Mood normal.        Behavior: Behavior normal.        Thought Content: Thought content normal.        Judgment: Judgment normal.     Results for orders placed or performed in visit on 08/30/20  Novel Coronavirus, NAA ([facility])   Specimen: Saline  Result Value Ref Range   SARS-CoV-2, NAA Detected (A) Not Detected  SARS-COV-2, NAA 2 DAY TAT  Result Value Ref Range   SARS-CoV-2, NAA 2 DAY TAT Performed       Assessment & Plan:   Problem List Items Addressed This Visit      Other   ADHD (attention deficit hyperactivity disorder)    Under good control on current regimen. Continue current regimen. Continue to monitor. Call with any concerns. Refills given for 3 months. Follow up 3 months.         Other Visit Diagnoses    Chest pain, unspecified type    -  Primary   EKG normal. Will obtain CXR. If not significantly better in 2 weeks from COVID- will refer to cardiology. Warning signs to go to ER discussed.    Relevant Orders   EKG 12-Lead (Completed)   DG Chest 2 View       Follow up plan: Return in about 3 months (around 12/24/2020).

## 2020-09-26 NOTE — Assessment & Plan Note (Signed)
Under good control on current regimen. Continue current regimen. Continue to monitor. Call with any concerns. Refills given for 3 months. Follow up 3 months.    

## 2020-10-02 NOTE — Progress Notes (Signed)
Interpreted by me on 09/26/20. NSR at 97bpm, no ST segment changes.

## 2020-10-03 ENCOUNTER — Telehealth: Payer: Self-pay | Admitting: Family Medicine

## 2020-10-03 DIAGNOSIS — R079 Chest pain, unspecified: Secondary | ICD-10-CM

## 2020-10-03 NOTE — Telephone Encounter (Signed)
-----   Message from Hyman Bible, CMA sent at 10/03/2020  9:21 AM EST ----- Patient notified.   Patient states that she is doing ok, that she still has a cough and feels like she should go ahead and do the stress test.

## 2020-10-10 ENCOUNTER — Other Ambulatory Visit: Payer: Self-pay

## 2020-10-10 ENCOUNTER — Encounter: Payer: Self-pay | Admitting: Cardiology

## 2020-10-10 ENCOUNTER — Ambulatory Visit (INDEPENDENT_AMBULATORY_CARE_PROVIDER_SITE_OTHER): Payer: BC Managed Care – PPO | Admitting: Cardiology

## 2020-10-10 VITALS — BP 120/90 | HR 115 | Ht 61.0 in | Wt 176.5 lb

## 2020-10-10 DIAGNOSIS — R072 Precordial pain: Secondary | ICD-10-CM | POA: Diagnosis not present

## 2020-10-10 DIAGNOSIS — E7800 Pure hypercholesterolemia, unspecified: Secondary | ICD-10-CM | POA: Diagnosis not present

## 2020-10-10 NOTE — Patient Instructions (Signed)
Medication Instructions:  Your physician recommends that you continue on your current medications as directed. Please refer to the Current Medication list given to you today.  *If you need a refill on your cardiac medications before your next appointment, please call your pharmacy*   Lab Work: None ordered If you have labs (blood work) drawn today and your tests are completely normal, you will receive your results only by: . MyChart Message (if you have MyChart) OR . A paper copy in the mail If you have any lab test that is abnormal or we need to change your treatment, we will call you to review the results.   Testing/Procedures:  1.  Your physician has requested that you have an echocardiogram. Echocardiography is a painless test that uses sound waves to create images of your heart. It provides your doctor with information about the size and shape of your heart and how well your heart's chambers and valves are working. This procedure takes approximately one hour. There are no restrictions for this procedure.     Follow-Up: At [facility], you and your health needs are our priority.  As part of our continuing mission to provide you with exceptional heart care, we have created designated Provider Care Teams.  These Care Teams include your primary Cardiologist (physician) and Advanced Practice Providers (APPs -  Physician Assistants and Nurse Practitioners) who all work together to provide you with the care you need, when you need it.  We recommend signing up for the patient portal called "MyChart".  Sign up information is provided on this After Visit Summary.  MyChart is used to connect with patients for Virtual Visits (Telemedicine).  Patients are able to view lab/test results, encounter notes, upcoming appointments, etc.  Non-urgent messages can be sent to your provider as well.   To learn more about what you can do with MyChart, go to [email or web address].    Your next appointment:    Follow up after echo    The format for your next appointment:   In Person  Provider:   Brian Agbor-Etang, MD   Other Instructions   

## 2020-10-10 NOTE — Progress Notes (Signed)
Cardiology Office Note:    Date:  10/10/2020   ID:  Pamela Avila, DOB 08/31/1970, MRN [id number]  PCP:  Dorcas Carrow DO   [facility]  Cardiologist:  Debbe Odea, MD  Advanced Practice Provider:  No care team member to display Electrophysiologist:  None       Referring MD: Dorcas Carrow, DO   Chief Complaint  Patient presents with  . Other    C/o Chest and back pain. Meds reviewed verbally with pt.   Pamela Avila is a 50 y.o. female who is being seen today for the evaluation of chest pain at the request of Dorcas Carrow, DO.   History of Present Illness:    Pamela Avila is a 50 y.o. female with a hx of anxiety, hypothyroidism who presents due to chest pain.  Patient states having symptoms of chest pain 2 weeks ago, she was sitting on her desk working from home at the time.  She states her symptoms of chest discomfort has been improving over the past several days, has not had any chest pain the past 2 days.  Symptoms of chest pain are not related with exertion.  Denies any trauma to the chest.  Moving her arms or palpation does not result in symptoms.  Denies any history of heart disease.  Past Medical History:  Diagnosis Date  . ADHD (attention deficit hyperactivity disorder)   . Anemia   . Anxiety   . Anxiety disorder   . Complication of anesthesia   . Depression   . Family history of adverse reaction to anesthesia   . Family history of diabetes mellitus   . GERD (gastroesophageal reflux disease)   . HSV (herpes simplex virus) infection   . Hypertension   . Hypothyroidism   . IBS (irritable bowel syndrome)   . Lab test positive for detection of COVID-19 virus   . PONV (postoperative nausea and vomiting)   . Sleep apnea   . Thyroid disease    hypothroidsim    Past Surgical History:  Procedure Laterality Date  . BREAST EXCISIONAL BIOPSY Right 2010   neg surgical breast bx    . BREAST SURGERY Right    lumpectomy  . CESAREAN  SECTION     x 2  . COLONOSCOPY WITH PROPOFOL N/A 12/28/2016   Procedure: COLONOSCOPY WITH PROPOFOL;  Surgeon: Wyline Mood, MD;  Location: [facility] ENDOSCOPY;  Service: Endoscopy;  Laterality: N/A;  . CRYOTHERAPY  20 + years ago   cervix  . DILITATION & CURRETTAGE/HYSTROSCOPY WITH NOVASURE ABLATION N/A 04/11/2015   Procedure: DILATATION & CURETTAGE/HYSTEROSCOPY WITH NOVASURE ABLATION;  Surgeon: Herold Harms, MD;  Location: [facility] ORS;  Service: Gynecology;  Laterality: N/A;  . ESOPHAGOGASTRODUODENOSCOPY (EGD) WITH PROPOFOL N/A 12/28/2016   Procedure: ESOPHAGOGASTRODUODENOSCOPY (EGD) WITH PROPOFOL;  Surgeon: Wyline Mood, MD;  Location: [facility] ENDOSCOPY;  Service: Endoscopy;  Laterality: N/A;  . ESSURE TUBAL LIGATION    . lumpectomy Right   . NASAL SEPTOPLASTY W/ TURBINOPLASTY Bilateral 10/07/2018   Procedure: SEPTOPLASTY WITH BILATERAL SUBMUCOUS RESECTION OF INFERIOR TURBINATES;  Surgeon: Linus Salmons, MD;  Location: [facility] ORS;  Service: ENT;  Laterality: Bilateral;  . TONSILLECTOMY    . TUBAL LIGATION      Current Medications: Current Meds  Medication Sig  . albuterol (VENTOLIN HFA) 108 (90 Base) MCG/ACT inhaler Inhale 2 puffs into the lungs every 6 (six) hours as needed for wheezing or shortness of breath.  . amphetamine-dextroamphetamine (ADDERALL XR) 10  MG 24 hr capsule Take 1 capsule (10 mg total) by mouth daily. To be taken with the 30mg  for 40mg  total daily  . [START ON 10/26/2020] amphetamine-dextroamphetamine (ADDERALL XR) 10 MG 24 hr capsule Take 1 capsule (10 mg total) by mouth daily. To be taken with the 30mg  for 40mg  total daily  . [START ON 11/25/2020] amphetamine-dextroamphetamine (ADDERALL XR) 10 MG 24 hr capsule Take 1 capsule (10 mg total) by mouth daily. To be taken with the 30mg  for 40mg  total daily  . amphetamine-dextroamphetamine (ADDERALL XR) 30 MG 24 hr capsule Take 1 capsule (30 mg total) by mouth every morning. To be taken with the 10mg  for 40mg  total daily  . [START ON  10/26/2020] amphetamine-dextroamphetamine (ADDERALL XR) 30 MG 24 hr capsule Take 1 capsule (30 mg total) by mouth every morning. To be taken with the 10mg  for 40mg  total daily  . [START ON 11/25/2020] amphetamine-dextroamphetamine (ADDERALL XR) 30 MG 24 hr capsule Take 1 capsule (30 mg total) by mouth every morning. To be taken with the 10mg  for 40mg  total daily  . atomoxetine (STRATTERA) 80 MG capsule Take 1 capsule (80 mg total) by mouth daily.  buPROPion (WELLBUTRIN XL) 150 MG 24 hr tablet TAKE 1 TABLET BY MOUTH EVERY DAY GENERIC EQUIVALENT FOR WELLBUTRIN XL  . busPIRone (BUSPAR) 5 MG tablet Take 1-2 tablets (5-10 mg total) by mouth 3 (three) times daily as needed (anxiety).  . chlorpheniramine-HYDROcodone (TUSSIONEX PENNKINETIC ER) 10-8 MG/5ML SUER Take 5 mLs by mouth every 12 (twelve) hours as needed.  . dicyclomine (BENTYL) 10 MG capsule Take 1 capsule (10 mg total) by mouth 3 (three) times daily as needed for spasms.  11/27/2020 escitalopram (LEXAPRO) 20 MG tablet Take 1 tablet (20 mg total) by mouth daily.  levothyroxine (SYNTHROID) 100 MCG tablet TAKE 1 TABLET BY MOUTH DAILY BEFORE BREAKFAST  . meclizine (ANTIVERT) 25 MG tablet Take 1 tablet (25 mg total) by mouth 3 (three) times daily as needed for dizziness.  . metoprolol succinate (TOPROL-XL) 25 MG 24 hr tablet TAKE ONE AND ONE-HALF TABLETS( 37.5 MG TOTAL) BY MOUTH DAILY GENERIC EQUIVALENT FOR TOPROL XL  . omeprazole (PRILOSEC) 20 MG capsule TAKE ONE CAPSULE BY MOUTH TWICE DAILY AS NEEDED FOR HEARTBURN/ INDIGESTION.     Allergies:   Erythromycin   Social History   Socioeconomic History  . Marital status: Significant Other    Spouse name: Not on file  . Number of children: Not on file  . Years of education: Not on file  . Highest education level: Not on file  Occupational History  . Not on file  Tobacco Use  . Smoking status: Former Smoker    Types: Cigarettes  . Smokeless tobacco: Never Used  . Tobacco comment: occasional  Vaping  Use  . Vaping Use: Former  Substance and Sexual Activity  . Alcohol use: Yes    Alcohol/week: 10.0 standard drinks    Types: 10 Shots of liquor per week    Comment: occasional  . Drug use: Yes    Types: Marijuana    Comment: Occasional, hx of cocaine in past  . Sexual activity: Yes  Other Topics Concern  . Not on file  Social History Narrative  . Not on file   Social Determinants of Health   Financial Resource Strain: Not on file  Food Insecurity: Not on file  Transportation Needs: Not on file  Physical Activity: Not on file  Stress: Not on file  Social Connections: Not on file  Family History: The patient's family history includes Breast cancer in her maternal grandmother, paternal aunt, paternal grandmother, and son; Cancer in her paternal aunt, paternal grandmother, and son; Diabetes in her father; Hyperlipidemia in her father and maternal grandfather; Hypertension in her father; Hypothyroidism in her mother and paternal grandfather; Obesity in her father; Ovarian cancer in her paternal aunt. There is no history of Colon cancer.  ROS:   Please see the history of present illness.     All other systems reviewed and are negative.  EKGs/Labs/Other Studies Reviewed:    The following studies were reviewed today:   EKG:  EKG is  ordered today.  The ekg ordered today demonstrates sinus tachycardia, otherwise normal ECG  Recent Labs: 12/21/2019: ALT 17; BUN 8; Creatinine, Ser 0.93; Hemoglobin 15.7; Platelets 288; Potassium 4.4; Sodium 139 01/28/2020: TSH 3.920  Recent Lipid Panel    Component Value Date/Time   CHOL 212 (H) 12/21/2019 1308   TRIG 128 12/21/2019 1308   HDL 59 12/21/2019 1308   LDLCALC 130 (H) 12/21/2019 1308     Risk Assessment/Calculations:      Physical Exam:    VS:  BP 120/90 (BP Location: Right Arm, Patient Position: Sitting, Cuff Size: Normal)   Pulse (!) 115   Ht 5\' 1"  (1.549 m)   Wt 176 lb 8 oz (80.1 kg)   SpO2 98%   BMI 33.35 kg/m      Wt Readings from Last 3 Encounters:  10/10/20 176 lb 8 oz (80.1 kg)  09/26/20 178 lb 3.2 oz (80.8 kg)  06/21/20 181 lb (82.1 kg)     GEN:  Well nourished, well developed in no acute distress HEENT: Normal NECK: No JVD; No carotid bruits LYMPHATICS: No lymphadenopathy CARDIAC: RRR, no murmurs, rubs, gallops RESPIRATORY:  Clear to auscultation without rales, wheezing or rhonchi  ABDOMEN: Soft, non-tender, non-distended MUSCULOSKELETAL:  No edema; No deformity  SKIN: Warm and dry NEUROLOGIC:  Alert and oriented x 3 PSYCHIATRIC:  Normal affect   ASSESSMENT:    1. Precordial pain   2. Pure hypercholesterolemia    PLAN:    In order of problems listed above:  1. Chest pain, symptoms not consistent with cardiac etiology.  Get echo to evaluate overall cardiac function.  May consider stress test if symptoms persist or become worse. 2. Hyperlipidemia, 10-year ASCVD risk 3.0%.  Patient not in statin benefit group.  Follow-up after echocardiogram      Medication Adjustments/Labs and Tests Ordered: Current medicines are reviewed at length with the patient today.  Concerns regarding medicines are outlined above.  Orders Placed This Encounter  Procedures  . EKG 12-Lead  . ECHOCARDIOGRAM COMPLETE   No orders of the defined types were placed in this encounter.   Patient Instructions  Medication Instructions:  Your physician recommends that you continue on your current medications as directed. Please refer to the Current Medication list given to you today.  *If you need a refill on your cardiac medications before your next appointment, please call your pharmacy*   Lab Work: None ordered If you have labs (blood work) drawn today and your tests are completely normal, you will receive your results only by: 13/09/21 MyChart Message (if you have MyChart) OR . A paper copy in the mail If you have any lab test that is abnormal or we need to change your treatment, we will call you to  review the results.   Testing/Procedures:  1.  Your physician has requested that you have an echocardiogram.  Echocardiography is a painless test that uses sound waves to create images of your heart. It provides your doctor with information about the size and shape of your heart and how well your heart's chambers and valves are working. This procedure takes approximately one hour. There are no restrictions for this procedure.     Follow-Up: At [facility], you and your health needs are our priority.  As part of our continuing mission to provide you with exceptional heart care, we have created designated Provider Care Teams.  These Care Teams include your primary Cardiologist (physician) and Advanced Practice Providers (APPs -  Physician Assistants and Nurse Practitioners) who all work together to provide you with the care you need, when you need it.  We recommend signing up for the patient portal called "MyChart".  Sign up information is provided on this After Visit Summary.  MyChart is used to connect with patients for Virtual Visits (Telemedicine).  Patients are able to view lab/test results, encounter notes, upcoming appointments, etc.  Non-urgent messages can be sent to your provider as well.   To learn more about what you can do with MyChart, go to [email or web address].    Your next appointment:   Follow up after echo   The format for your next appointment:   In Person  Provider:   Debbe OdeaBrian Agbor-Etang, MD   Other Instructions      Signed, Debbe OdeaBrian Agbor-Etang, MD  10/10/2020 5:06 PM    [facility]

## 2020-11-02 DIAGNOSIS — Z03818 Encounter for observation for suspected exposure to other biological agents ruled out: Secondary | ICD-10-CM | POA: Diagnosis not present

## 2020-11-02 DIAGNOSIS — Z20822 Contact with and (suspected) exposure to covid-19: Secondary | ICD-10-CM | POA: Diagnosis not present

## 2020-11-08 ENCOUNTER — Other Ambulatory Visit: Payer: BC Managed Care – PPO

## 2020-11-21 ENCOUNTER — Ambulatory Visit: Payer: BC Managed Care – PPO | Admitting: Cardiology

## 2020-12-24 ENCOUNTER — Encounter: Payer: Self-pay | Admitting: Family Medicine

## 2020-12-25 DIAGNOSIS — L237 Allergic contact dermatitis due to plants, except food: Secondary | ICD-10-CM | POA: Diagnosis not present

## 2020-12-29 ENCOUNTER — Ambulatory Visit (INDEPENDENT_AMBULATORY_CARE_PROVIDER_SITE_OTHER): Payer: BC Managed Care – PPO | Admitting: Family Medicine

## 2020-12-29 ENCOUNTER — Encounter: Payer: Self-pay | Admitting: Family Medicine

## 2020-12-29 ENCOUNTER — Other Ambulatory Visit: Payer: Self-pay

## 2020-12-29 VITALS — BP 146/97 | HR 83 | Temp 97.9°F | Wt 182.0 lb

## 2020-12-29 DIAGNOSIS — I10 Essential (primary) hypertension: Secondary | ICD-10-CM

## 2020-12-29 DIAGNOSIS — L247 Irritant contact dermatitis due to plants, except food: Secondary | ICD-10-CM

## 2020-12-29 DIAGNOSIS — K219 Gastro-esophageal reflux disease without esophagitis: Secondary | ICD-10-CM

## 2020-12-29 DIAGNOSIS — F901 Attention-deficit hyperactivity disorder, predominantly hyperactive type: Secondary | ICD-10-CM

## 2020-12-29 DIAGNOSIS — Z13220 Encounter for screening for lipoid disorders: Secondary | ICD-10-CM

## 2020-12-29 DIAGNOSIS — F32A Depression, unspecified: Secondary | ICD-10-CM

## 2020-12-29 DIAGNOSIS — F419 Anxiety disorder, unspecified: Secondary | ICD-10-CM | POA: Diagnosis not present

## 2020-12-29 DIAGNOSIS — E038 Other specified hypothyroidism: Secondary | ICD-10-CM | POA: Diagnosis not present

## 2020-12-29 LAB — MICROALBUMIN, URINE WAIVED
Creatinine, Urine Waived: 300 mg/dL (ref 10–300)
Microalb, Ur Waived: 30 mg/L — ABNORMAL HIGH (ref 0–19)
Microalb/Creat Ratio: <30 mg/g (ref <30)

## 2020-12-29 MED ORDER — ALBUTEROL SULFATE HFA 108 (90 BASE) MCG/ACT IN AERS: 2.0000 | INHALATION_SPRAY | Freq: Four times a day (QID) | RESPIRATORY_TRACT | 0 refills | Status: DC | PRN

## 2020-12-29 MED ORDER — ESCITALOPRAM OXALATE 20 MG PO TABS: 20.0000 mg | ORAL_TABLET | Freq: Every day | ORAL | 1 refills | Status: DC

## 2020-12-29 MED ORDER — LISINOPRIL 10 MG PO TABS: 10.0000 mg | ORAL_TABLET | Freq: Every day | ORAL | 3 refills | Status: DC

## 2020-12-29 MED ORDER — AMPHETAMINE-DEXTROAMPHET ER 10 MG PO CP24: 10.0000 mg | ORAL_CAPSULE | Freq: Every day | ORAL | 0 refills | Status: DC

## 2020-12-29 MED ORDER — AMPHETAMINE-DEXTROAMPHET ER 30 MG PO CP24: 30.0000 mg | ORAL_CAPSULE | ORAL | 0 refills | Status: DC

## 2020-12-29 MED ORDER — BUPROPION HCL ER (XL) 150 MG PO TB24: ORAL_TABLET | ORAL | 1 refills | Status: DC

## 2020-12-29 MED ORDER — OMEPRAZOLE 20 MG PO CPDR: DELAYED_RELEASE_CAPSULE | ORAL | 1 refills | Status: DC

## 2020-12-29 MED ORDER — METOPROLOL SUCCINATE ER 25 MG PO TB24: ORAL_TABLET | ORAL | 1 refills | Status: DC

## 2020-12-29 MED ORDER — DICYCLOMINE HCL 10 MG PO CAPS: 10.0000 mg | ORAL_CAPSULE | Freq: Three times a day (TID) | ORAL | 1 refills | Status: DC | PRN

## 2020-12-29 MED ORDER — ATOMOXETINE HCL 80 MG PO CAPS: 80.0000 mg | ORAL_CAPSULE | Freq: Every day | ORAL | 1 refills | Status: DC

## 2020-12-29 MED ORDER — BUSPIRONE HCL 5 MG PO TABS: 5.0000 mg | ORAL_TABLET | Freq: Three times a day (TID) | ORAL | 1 refills | Status: DC | PRN

## 2020-12-29 NOTE — Progress Notes (Signed)
BP (!) 146/97   Pulse 83   Temp 97.9 F (36.6 C)   Wt 182 lb (82.6 kg)   SpO2 99%   BMI 34.39 kg/m    Subjective:    Patient ID: Pamela Avila, female    DOB: 05-05-1971, 50 y.o.   MRN: [id number]  HPI: Pamela Avila is a 50 y.o. female  Chief Complaint  Patient presents with  . ADHD  . Hypertension  . Gastroesophageal Reflux  . Anxiety  . Depression  . Rash    Patient states she had a telehealth visit Sunday, was prescribed a steroid for poison ivy. Patient has had a rash for over a week.   . Nail Problem    Patient states she has a crack in her nail, on middle finger right hand.    ADHD FOLLOW UP ADHD status: controlled Satisfied with current therapy: yes Medication compliance:  excellent compliance Controlled substance contract: yes Previous psychiatry evaluation: no Previous medications: yes    Taking meds on weekends/vacations: yes Work/school performance:  good Difficulty sustaining attention/completing tasks: no Distracted by extraneous stimuli: no Does not listen when spoken to: no  Fidgets with hands or feet: no Unable to stay in seat: no Blurts out/interrupts others: no ADHD Medication Side Effects: no    Decreased appetite: no    Headache: no    Sleeping disturbance pattern: no    Irritability: no    Rebound effects (worse than baseline) off medication: no    Anxiousness: no    Dizziness: no    Tics: no  DEPRESSION- has been under a lot of stress. Her father died.  Mood status: stable Satisfied with current treatment?: yes Symptom severity: moderate  Duration of current treatment : chronic Side effects: no Medication compliance: excellent compliance Psychotherapy/counseling: no  Depressed mood: yes Anxious mood: yes Anhedonia: no Significant weight loss or gain: no Insomnia: no  Fatigue: yes Feelings of worthlessness or guilt: no Impaired concentration/indecisiveness: no Suicidal ideations: no Hopelessness: no Crying spells:  no Depression screen [facility] 2/9 12/29/2020 09/07/2020 03/23/2020 12/21/2019 06/29/2019  Decreased Interest 0 0 0 0 1  Down, Depressed, Hopeless 0 0 0 0 1  PHQ - 2 Score 0 0 0 0 2  Altered sleeping 2 0 0 1 1  Tired, decreased energy 1 0 0 1 2  Change in appetite 0 0 0 1 0  Feeling bad or failure about yourself  0 0 0 0 0  Trouble concentrating 1 0 3 2 2   Moving slowly or fidgety/restless 1 0 1 1 1   Suicidal thoughts 0 0 0 0 0  PHQ-9 Score 5 0 4 6 8   Difficult doing work/chores Somewhat difficult - - Somewhat difficult Somewhat difficult  Some recent data might be hidden   HYPOTHYROIDISM Thyroid control status:stable Satisfied with current treatment? yes Medication side effects: no Medication compliance: excellent compliance Recent dose adjustment:no Fatigue: no Cold intolerance: no Heat intolerance: no Weight gain: no Weight loss: no Constipation: no Diarrhea/loose stools: no Palpitations: no Lower extremity edema: no Anxiety/depressed mood: yes  HYPERTENSION Hypertension status: stable  Satisfied with current treatment? yes Duration of hypertension: chronic BP monitoring frequency:  Occasionally- in 140s/90s BP medication side effects:  no Medication compliance: excellent compliance Previous BP meds: metoprolol Aspirin: no Recurrent headaches: no Visual changes: no Palpitations: no Dyspnea: no Chest pain: no Lower extremity edema: no Dizzy/lightheaded: no  Relevant past medical, surgical, family and social history reviewed and updated as indicated. Interim medical history  since our last visit reviewed. Allergies and medications reviewed and updated.  Review of Systems  Constitutional: Negative.   Respiratory: Negative.   Cardiovascular: Negative.   Gastrointestinal: Negative.   Musculoskeletal: Negative.   Neurological: Negative.   Psychiatric/Behavioral: Negative.     Per HPI unless specifically indicated above     Objective:    BP (!) 146/97   Pulse 83    Temp 97.9 F (36.6 C)   Wt 182 lb (82.6 kg)   SpO2 99%   BMI 34.39 kg/m   Wt Readings from Last 3 Encounters:  12/29/20 182 lb (82.6 kg)  10/10/20 176 lb 8 oz (80.1 kg)  09/26/20 178 lb 3.2 oz (80.8 kg)    Physical Exam Vitals and nursing note reviewed.  Constitutional:      General: She is not in acute distress.    Appearance: Normal appearance. She is not ill-appearing, toxic-appearing or diaphoretic.  HENT:     Head: Normocephalic and atraumatic.     Right Ear: External ear normal.     Left Ear: External ear normal.     Nose: Nose normal.     Mouth/Throat:     Mouth: Mucous membranes are moist.     Pharynx: Oropharynx is clear.  Eyes:     General: No scleral icterus.       Right eye: No discharge.        Left eye: No discharge.     Extraocular Movements: Extraocular movements intact.     Conjunctiva/sclera: Conjunctivae normal.     Pupils: Pupils are equal, round, and reactive to light.  Cardiovascular:     Rate and Rhythm: Normal rate and regular rhythm.     Pulses: Normal pulses.     Heart sounds: Normal heart sounds. No murmur heard. No friction rub. No gallop.   Pulmonary:     Effort: Pulmonary effort is normal. No respiratory distress.     Breath sounds: Normal breath sounds. No stridor. No wheezing, rhonchi or rales.  Chest:     Chest wall: No tenderness.  Musculoskeletal:        General: Normal range of motion.     Cervical back: Normal range of motion and neck supple.  Skin:    General: Skin is warm and dry.     Capillary Refill: Capillary refill takes less than 2 seconds.     Coloration: Skin is not jaundiced or pale.     Findings: No bruising, erythema, lesion or rash.  Neurological:     General: No focal deficit present.     Mental Status: She is alert and oriented to person, place, and time. Mental status is at baseline.  Psychiatric:        Mood and Affect: Mood normal.        Behavior: Behavior normal.        Thought Content: Thought content  normal.        Judgment: Judgment normal.     Results for orders placed or performed in visit on 12/29/20  Microalbumin, Urine Waived  Result Value Ref Range   Microalb, Ur Waived 30 (H) 0 - 19 mg/L   Creatinine, Urine Waived 300 10 - 300 mg/dL   Microalb/Creat Ratio <30 <30 mg/g      Assessment & Plan:   Problem List Items Addressed This Visit      Cardiovascular and Mediastinum   Essential hypertension, benign - Primary    Not under good control. Will add lisinopril and continue  metoprolol. Call with any concerns. Continue to monitor. Call with any concerns.       Relevant Medications   metoprolol succinate (TOPROL-XL) 25 MG 24 hr tablet   lisinopril (ZESTRIL) 10 MG tablet   Other Relevant Orders   Comprehensive metabolic panel   Microalbumin, Urine Waived (Completed)     Digestive   GERD (gastroesophageal reflux disease)    Under good control on current regimen. Continue current regimen. Continue to monitor. Call with any concerns. Refills given. Labs drawn today.        Relevant Medications   omeprazole (PRILOSEC) 20 MG capsule   dicyclomine (BENTYL) 10 MG capsule   Other Relevant Orders   CBC with Differential/Platelet   Comprehensive metabolic panel     Endocrine   Hypothyroidism    Rechecking labs today. Await results. Treat as needed.       Relevant Medications   metoprolol succinate (TOPROL-XL) 25 MG 24 hr tablet   Other Relevant Orders   Comprehensive metabolic panel   TSH     Other   ADHD (attention deficit hyperactivity disorder)    Under good control on current regimen. Continue current regimen. Continue to monitor. Call with any concerns. Refills given for 3 months. Follow up 3 months.       Relevant Orders   Comprehensive metabolic panel   [id number] 11+Oxyco+Alc+Crt-Bund   Anxiety and depression    Under good control on current regimen. Continue current regimen. Continue to monitor. Call with any concerns. Refills given. Labs drawn today.         Relevant Medications   escitalopram (LEXAPRO) 20 MG tablet   busPIRone (BUSPAR) 5 MG tablet   buPROPion (WELLBUTRIN XL) 150 MG 24 hr tablet   Other Relevant Orders   Comprehensive metabolic panel    Other Visit Diagnoses    Screening for cholesterol level       Labs drawn today. Await results.    Relevant Orders   Lipid Panel w/o Chol/HDL Ratio   Irritant contact dermatitis due to plants, except food       Improving. Continue steroids. Call with any concerns.        Follow up plan: Return in about 3 months (around 03/31/2021).

## 2020-12-29 NOTE — Assessment & Plan Note (Signed)
Under good control on current regimen. Continue current regimen. Continue to monitor. Call with any concerns. Refills given for 3 months. Follow up 3 months.    

## 2020-12-29 NOTE — Assessment & Plan Note (Signed)
Not under good control. Will add lisinopril and continue metoprolol. Call with any concerns. Continue to monitor. Call with any concerns.

## 2020-12-29 NOTE — Assessment & Plan Note (Signed)
Rechecking labs today. Await results. Treat as needed.  °

## 2020-12-29 NOTE — Assessment & Plan Note (Signed)
Under good control on current regimen. Continue current regimen. Continue to monitor. Call with any concerns. Refills given. Labs drawn today.   

## 2020-12-30 ENCOUNTER — Other Ambulatory Visit: Payer: Self-pay | Admitting: Family Medicine

## 2020-12-30 DIAGNOSIS — E038 Other specified hypothyroidism: Secondary | ICD-10-CM

## 2020-12-30 LAB — CBC WITH DIFFERENTIAL/PLATELET
Basophils Absolute: 0 10*3/uL (ref 0.0–0.2)
Basos: 0 %
EOS (ABSOLUTE): 0.1 10*3/uL (ref 0.0–0.4)
Eos: 1 %
Hematocrit: 42.7 % (ref 34.0–46.6)
Hemoglobin: 14.4 g/dL (ref 11.1–15.9)
Immature Grans (Abs): 0.1 10*3/uL (ref 0.0–0.1)
Immature Granulocytes: 1 %
Lymphocytes Absolute: 3.6 10*3/uL — ABNORMAL HIGH (ref 0.7–3.1)
Lymphs: 34 %
MCH: 31.4 pg (ref 26.6–33.0)
MCHC: 33.7 g/dL (ref 31.5–35.7)
MCV: 93 fL (ref 79–97)
Monocytes Absolute: 0.7 10*3/uL (ref 0.1–0.9)
Monocytes: 7 %
Neutrophils Absolute: 6.1 10*3/uL (ref 1.4–7.0)
Neutrophils: 57 %
Platelets: 356 10*3/uL (ref 150–450)
RBC: 4.59 x10E6/uL (ref 3.77–5.28)
RDW: 12.5 % (ref 11.7–15.4)
WBC: 10.6 10*3/uL (ref 3.4–10.8)

## 2020-12-30 LAB — LIPID PANEL W/O CHOL/HDL RATIO
Cholesterol, Total: 261 mg/dL — ABNORMAL HIGH (ref 100–199)
HDL: 71 mg/dL (ref >39)
LDL Chol Calc (NIH): 170 mg/dL — ABNORMAL HIGH (ref 0–99)
Triglycerides: 117 mg/dL (ref 0–149)
VLDL Cholesterol Cal: 20 mg/dL (ref 5–40)

## 2020-12-30 LAB — COMPREHENSIVE METABOLIC PANEL
ALT: 24 IU/L (ref 0–32)
AST: 16 IU/L (ref 0–40)
Albumin/Globulin Ratio: 1.7 (ref 1.2–2.2)
Albumin: 4.7 g/dL (ref 3.8–4.8)
Alkaline Phosphatase: 72 IU/L (ref 44–121)
BUN/Creatinine Ratio: 19 (ref 9–23)
BUN: 16 mg/dL (ref 6–24)
Bilirubin Total: 0.4 mg/dL (ref 0.0–1.2)
CO2: 23 mmol/L (ref 20–29)
Calcium: 9.2 mg/dL (ref 8.7–10.2)
Chloride: 98 mmol/L (ref 96–106)
Creatinine, Ser: 0.85 mg/dL (ref 0.57–1.00)
Globulin, Total: 2.7 g/dL (ref 1.5–4.5)
Glucose: 93 mg/dL (ref 65–99)
Potassium: 3.6 mmol/L (ref 3.5–5.2)
Sodium: 138 mmol/L (ref 134–144)
Total Protein: 7.4 g/dL (ref 6.0–8.5)
eGFR: 84 mL/min/{1.73_m2} (ref >59)

## 2020-12-30 LAB — TSH: TSH: 27.4 u[IU]/mL — ABNORMAL HIGH (ref 0.450–4.500)

## 2021-01-12 LAB — DRUG SCREEN 764883 11+OXYCO+ALC+CRT-BUND
BENZODIAZ UR QL: NEGATIVE ng/mL
Barbiturate: NEGATIVE ng/mL
Cocaine (Metabolite): NEGATIVE ng/mL
Creatinine: 173.7 mg/dL (ref 20.0–300.0)
Ethanol: NEGATIVE %
Meperidine: NEGATIVE ng/mL
Methadone Screen, Urine: NEGATIVE ng/mL
OPIATE SCREEN URINE: NEGATIVE ng/mL
Oxycodone/Oxymorphone, Urine: NEGATIVE ng/mL
Phencyclidine: NEGATIVE ng/mL
Propoxyphene: NEGATIVE ng/mL
Tramadol: NEGATIVE ng/mL
pH, Urine: 5.5 (ref 4.5–8.9)

## 2021-01-12 LAB — CANNABINOID CONFIRMATION, UR
CANNABINOIDS: POSITIVE — AB
Carboxy THC GC/MS Conf: 51 ng/mL

## 2021-01-12 LAB — DRUG PROFILE 799016
Amphetamine GC/MS Conf: >3000 ng/mL
Amphetamine: POSITIVE — AB
Amphetamines: POSITIVE — AB
Methamphetamine: NEGATIVE

## 2021-01-30 ENCOUNTER — Other Ambulatory Visit: Payer: Self-pay

## 2021-01-30 ENCOUNTER — Other Ambulatory Visit: Payer: BC Managed Care – PPO

## 2021-01-30 DIAGNOSIS — E038 Other specified hypothyroidism: Secondary | ICD-10-CM | POA: Diagnosis not present

## 2021-01-31 ENCOUNTER — Encounter: Payer: Self-pay | Admitting: Family Medicine

## 2021-01-31 LAB — TSH: TSH: 12.1 u[IU]/mL — ABNORMAL HIGH (ref 0.450–4.500)

## 2021-02-02 ENCOUNTER — Other Ambulatory Visit: Payer: Self-pay | Admitting: Family Medicine

## 2021-02-02 MED ORDER — LEVOTHYROXINE SODIUM 125 MCG PO TABS: 125.0000 ug | ORAL_TABLET | Freq: Every day | ORAL | 2 refills | Status: DC

## 2021-03-31 ENCOUNTER — Ambulatory Visit: Payer: BC Managed Care – PPO | Admitting: Family Medicine

## 2021-04-14 IMAGING — DX DG CHEST 2V
2 series · 2 of 2 positions shown · non-contrast
Comparison: No priors.

CLINICAL DATA: 50-year-old female patient tested positive for COVID
presenting with cough, shortness of breath and wheezing. Evaluate
for pneumonia.

EXAM:
CHEST - 2 VIEW

[chest pa]
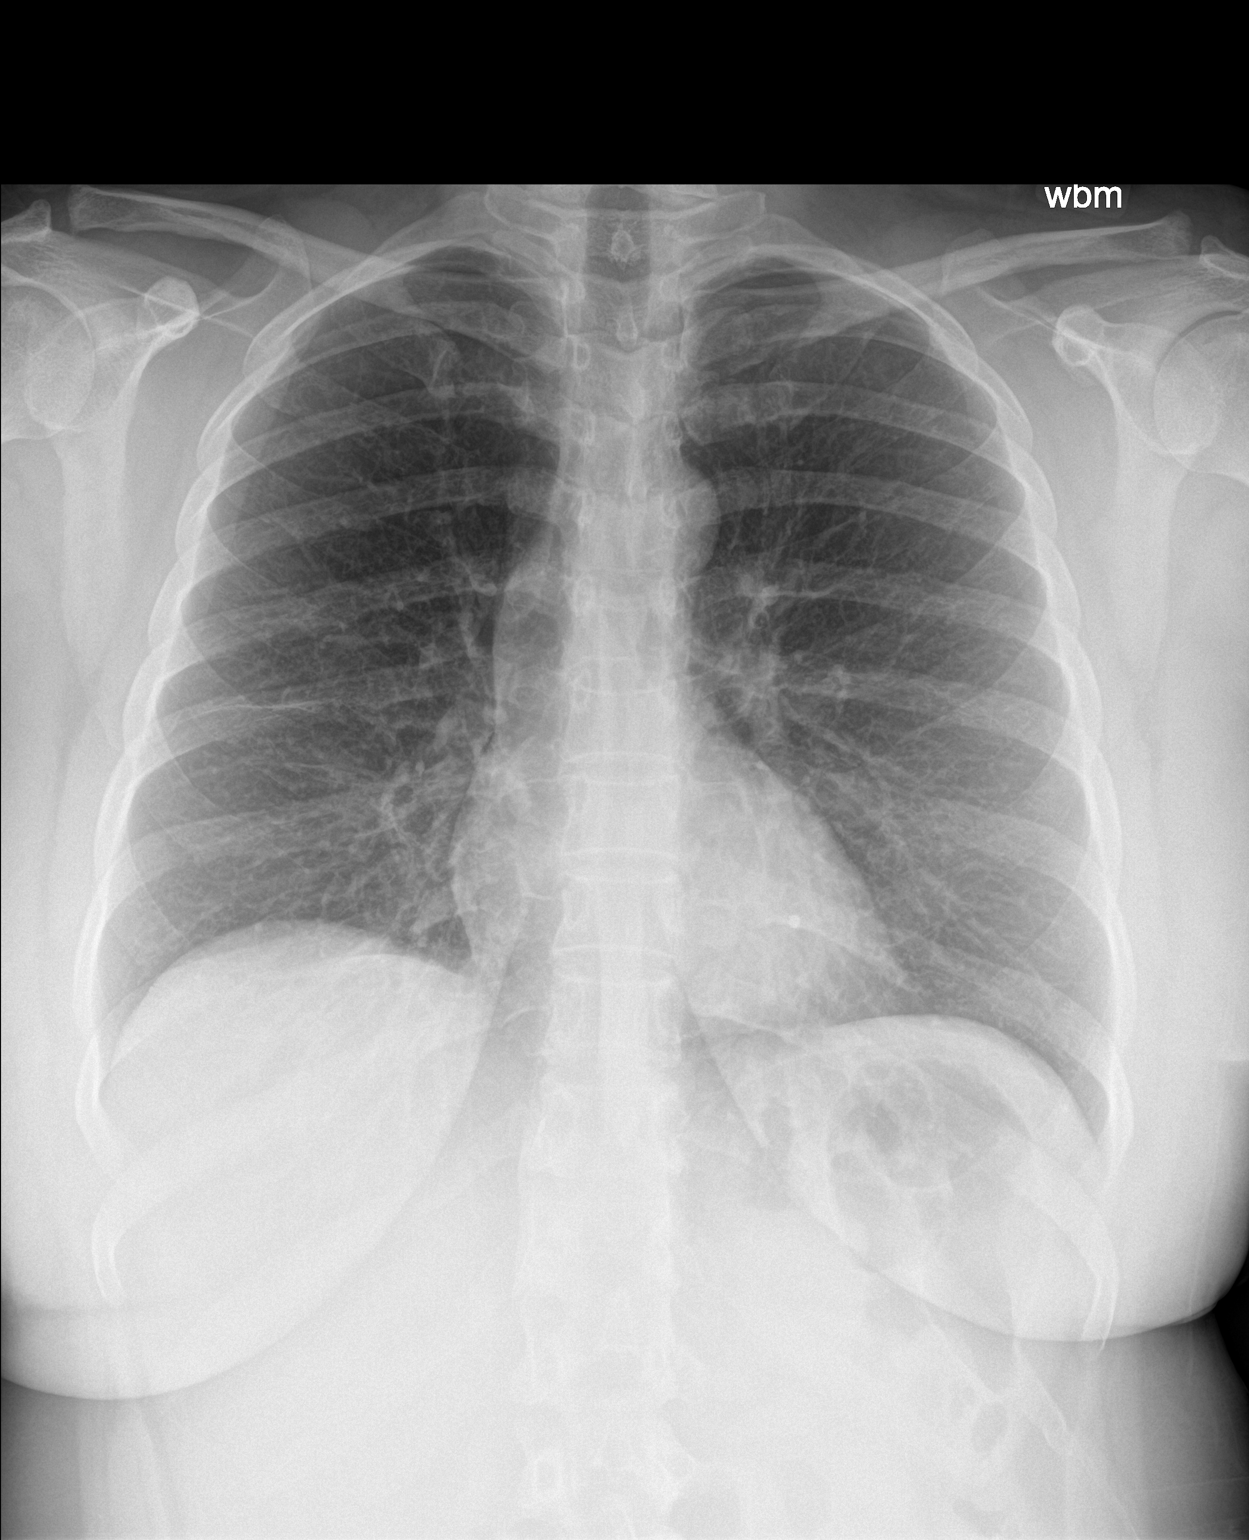

[chest lat]
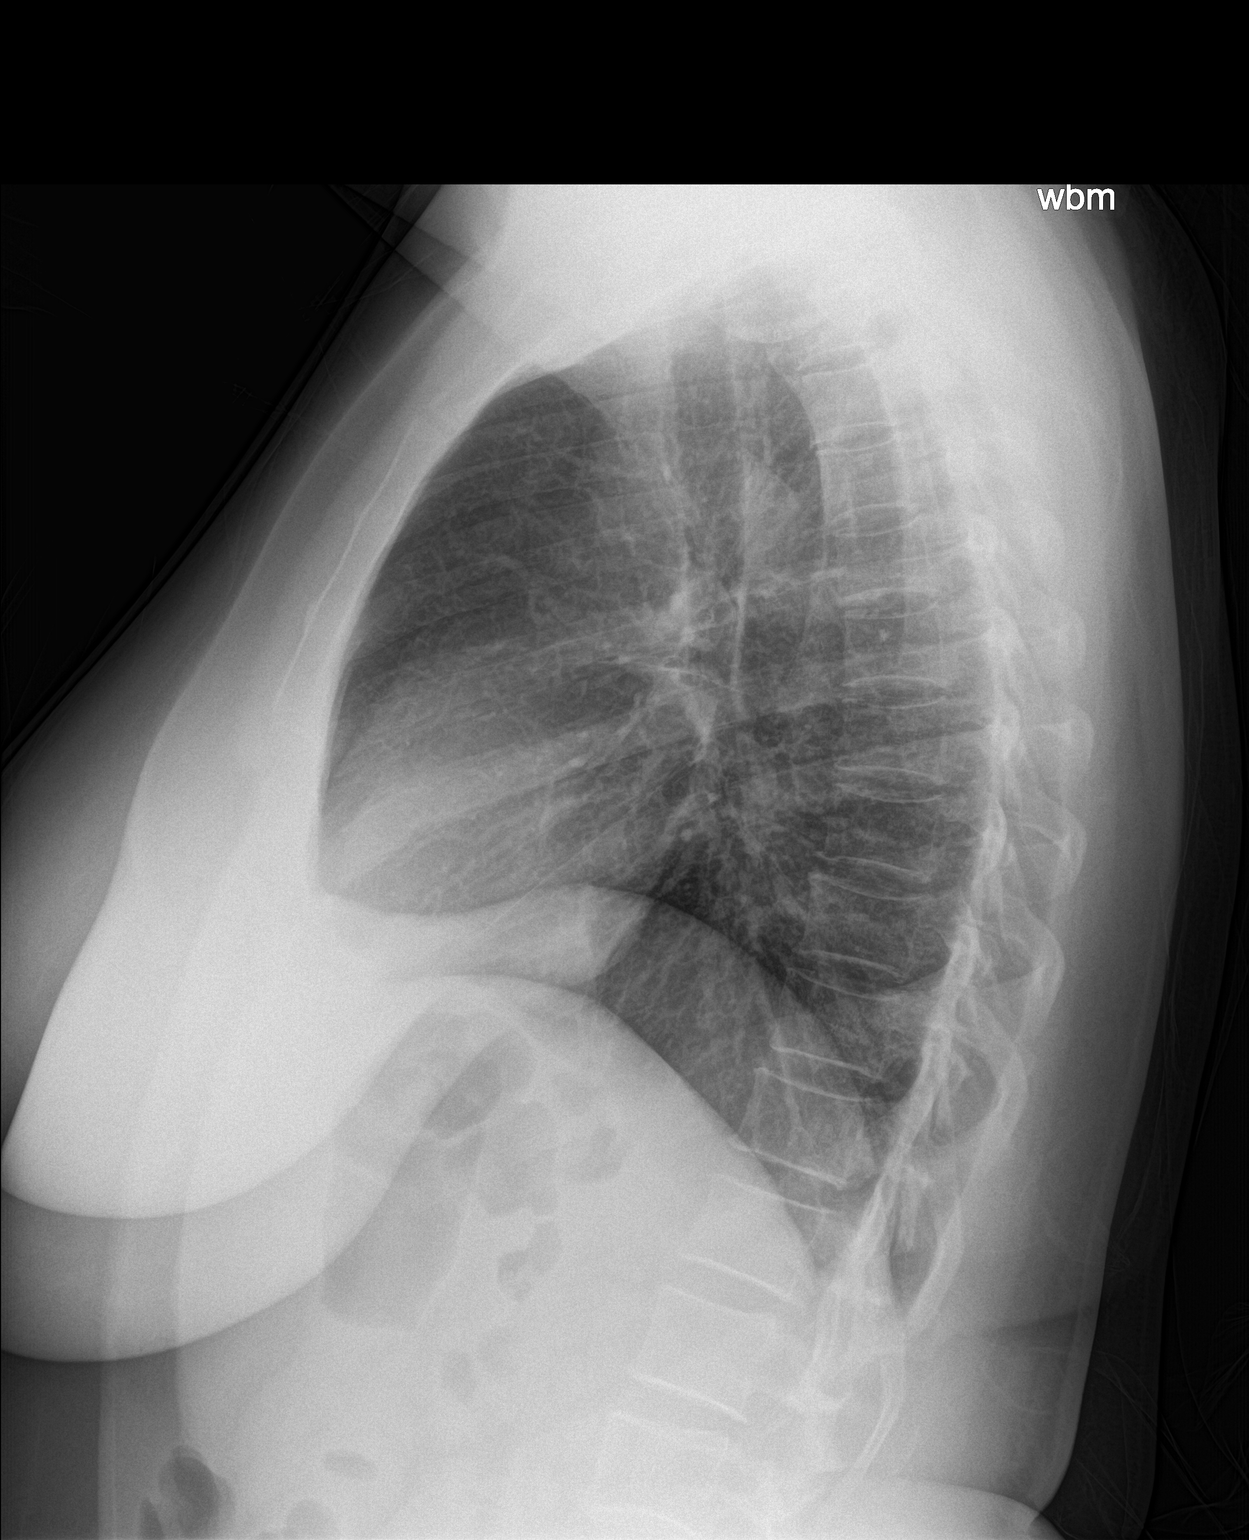

[2 of 2 positions shown; findings below may reference images not displayed]

FINDINGS: Lung volumes are normal. No consolidative airspace disease. No
pleural effusions. No pneumothorax. No pulmonary nodule or mass
noted. Pulmonary vasculature and the cardiomediastinal silhouette
are within normal limits.
IMPRESSION: No radiographic evidence of acute cardiopulmonary disease.

## 2021-04-18 ENCOUNTER — Ambulatory Visit: Payer: BC Managed Care – PPO | Admitting: Family Medicine

## 2021-04-25 ENCOUNTER — Encounter: Payer: Self-pay | Admitting: Family Medicine

## 2021-04-26 MED ORDER — ACYCLOVIR 400 MG PO TABS: 400.0000 mg | ORAL_TABLET | Freq: Three times a day (TID) | ORAL | 0 refills | Status: AC

## 2021-05-03 ENCOUNTER — Ambulatory Visit: Payer: BC Managed Care – PPO | Admitting: Family Medicine

## 2021-05-23 ENCOUNTER — Ambulatory Visit: Payer: BC Managed Care – PPO | Admitting: Family Medicine

## 2021-05-26 ENCOUNTER — Ambulatory Visit (INDEPENDENT_AMBULATORY_CARE_PROVIDER_SITE_OTHER): Payer: BC Managed Care – PPO | Admitting: Family Medicine

## 2021-05-26 ENCOUNTER — Other Ambulatory Visit: Payer: Self-pay

## 2021-05-26 ENCOUNTER — Encounter: Payer: Self-pay | Admitting: Family Medicine

## 2021-05-26 VITALS — BP 139/86 | HR 80 | Ht 61.0 in | Wt 180.0 lb

## 2021-05-26 DIAGNOSIS — K219 Gastro-esophageal reflux disease without esophagitis: Secondary | ICD-10-CM | POA: Diagnosis not present

## 2021-05-26 DIAGNOSIS — F419 Anxiety disorder, unspecified: Secondary | ICD-10-CM | POA: Diagnosis not present

## 2021-05-26 DIAGNOSIS — Z13220 Encounter for screening for lipoid disorders: Secondary | ICD-10-CM | POA: Diagnosis not present

## 2021-05-26 DIAGNOSIS — E038 Other specified hypothyroidism: Secondary | ICD-10-CM

## 2021-05-26 DIAGNOSIS — I10 Essential (primary) hypertension: Secondary | ICD-10-CM | POA: Diagnosis not present

## 2021-05-26 DIAGNOSIS — F901 Attention-deficit hyperactivity disorder, predominantly hyperactive type: Secondary | ICD-10-CM | POA: Diagnosis not present

## 2021-05-26 DIAGNOSIS — F32A Depression, unspecified: Secondary | ICD-10-CM

## 2021-05-26 LAB — URINALYSIS, ROUTINE W REFLEX MICROSCOPIC
Bilirubin, UA: NEGATIVE
Glucose, UA: NEGATIVE
Ketones, UA: NEGATIVE
Leukocytes,UA: NEGATIVE
Nitrite, UA: NEGATIVE
Protein,UA: NEGATIVE
RBC, UA: NEGATIVE
Specific Gravity, UA: 1.025 (ref 1.005–1.030)
Urobilinogen, Ur: 0.2 mg/dL (ref 0.2–1.0)
pH, UA: 6 (ref 5.0–7.5)

## 2021-05-26 LAB — MICROALBUMIN, URINE WAIVED
Creatinine, Urine Waived: 300 mg/dL (ref 10–300)
Microalb, Ur Waived: 30 mg/L — ABNORMAL HIGH (ref 0–19)
Microalb/Creat Ratio: <30 mg/g (ref <30)

## 2021-05-26 MED ORDER — AMPHETAMINE-DEXTROAMPHET ER 10 MG PO CP24: 10.0000 mg | ORAL_CAPSULE | Freq: Every day | ORAL | 0 refills | Status: DC

## 2021-05-26 MED ORDER — LISINOPRIL 10 MG PO TABS: 10.0000 mg | ORAL_TABLET | Freq: Every day | ORAL | 1 refills | Status: DC

## 2021-05-26 MED ORDER — AMPHETAMINE-DEXTROAMPHET ER 30 MG PO CP24: 30.0000 mg | ORAL_CAPSULE | ORAL | 0 refills | Status: DC

## 2021-05-26 MED ORDER — OMEPRAZOLE 20 MG PO CPDR: DELAYED_RELEASE_CAPSULE | ORAL | 1 refills | Status: DC

## 2021-05-26 MED ORDER — MELOXICAM 15 MG PO TABS: 15.0000 mg | ORAL_TABLET | Freq: Every day | ORAL | 1 refills | Status: DC

## 2021-05-26 MED ORDER — ESCITALOPRAM OXALATE 20 MG PO TABS: 20.0000 mg | ORAL_TABLET | Freq: Every day | ORAL | 1 refills | Status: DC

## 2021-05-26 MED ORDER — ALBUTEROL SULFATE HFA 108 (90 BASE) MCG/ACT IN AERS
2.0000 | INHALATION_SPRAY | Freq: Four times a day (QID) | RESPIRATORY_TRACT | 6 refills | Status: DC | PRN
Start: 2021-05-26 — End: 2021-12-05

## 2021-05-26 MED ORDER — AMPHETAMINE-DEXTROAMPHET ER 30 MG PO CP24
30.0000 mg | ORAL_CAPSULE | ORAL | 0 refills | Status: DC
Start: 2021-06-25 — End: 2021-09-21

## 2021-05-26 MED ORDER — BUSPIRONE HCL 5 MG PO TABS: 5.0000 mg | ORAL_TABLET | Freq: Three times a day (TID) | ORAL | 1 refills | Status: DC | PRN

## 2021-05-26 MED ORDER — BUPROPION HCL ER (XL) 150 MG PO TB24: ORAL_TABLET | ORAL | 1 refills | Status: DC

## 2021-05-26 MED ORDER — ATOMOXETINE HCL 80 MG PO CAPS: 80.0000 mg | ORAL_CAPSULE | Freq: Every day | ORAL | 1 refills | Status: DC

## 2021-05-26 MED ORDER — METOPROLOL SUCCINATE ER 25 MG PO TB24: ORAL_TABLET | ORAL | 1 refills | Status: DC

## 2021-05-26 MED ORDER — AMPHETAMINE-DEXTROAMPHETAMINE 5 MG PO TABS: 5.0000 mg | ORAL_TABLET | Freq: Every day | ORAL | 0 refills | Status: DC

## 2021-05-26 NOTE — Assessment & Plan Note (Signed)
Under good control on current regimen. Continue current regimen. Continue to monitor. Call with any concerns. Refills given. Labs drawn today.   

## 2021-05-26 NOTE — Progress Notes (Signed)
BP 139/86   Pulse 80   Ht 5\' 1"  (1.549 m)   Wt 180 lb (81.6 kg)   BMI 34.01 kg/m    Subjective:    Patient ID: , female    DOB: 08-06-1971, 50 y.o.   MRN: [id number]  HPI: Pamela Avila is a 50 y.o. female  Chief Complaint  Patient presents with   ADHD   ADHD FOLLOW UP ADHD status: stable- working about 2 hours longer a day, having some trouble trying to stay focused at the end of the day Satisfied with current therapy: no Medication compliance:  excellent compliance Controlled substance contract: yes Previous psychiatry evaluation: yes Previous medications: yes    Taking meds on weekends/vacations: yes Work/school performance:  good Difficulty sustaining attention/completing tasks: yes Distracted by extraneous stimuli: yes Does not listen when spoken to: no  Fidgets with hands or feet: no Unable to stay in seat: no Blurts out/interrupts others: no ADHD Medication Side Effects: no    Decreased appetite: no    Headache: no    Sleeping disturbance pattern: no    Irritability: no    Rebound effects (worse than baseline) off medication: no    Anxiousness: no    Dizziness: no    Tics: no  HYPOTHYROIDISM Thyroid control status:controlled Satisfied with current treatment? yes Medication side effects: no Medication compliance: excellent compliance Etiology of hypothyroidism:  Recent dose adjustment:yes Fatigue: yes Cold intolerance: no Heat intolerance: no Weight gain: no Weight loss: no Constipation: no Diarrhea/loose stools: no Palpitations: no Lower extremity edema: no Anxiety/depressed mood: no  HYPERTENSION Hypertension status: controlled  Satisfied with current treatment? yes Duration of hypertension: chronic BP monitoring frequency:  not checking BP medication side effects:  no Medication compliance: excellent compliance Previous BP meds:lisinopril, metoprolol Aspirin: no Recurrent headaches: no Visual changes: no Palpitations:  no Dyspnea: no Chest pain: no Lower extremity edema: no Dizzy/lightheaded: no  DEPRESSION Mood status: stable Satisfied with current treatment?: yes Symptom severity: mild  Duration of current treatment : chronic Side effects: no Medication compliance: excellent compliance Psychotherapy/counseling: no  Previous psychiatric medications: lexapro Depressed mood: yes Anxious mood: no Anhedonia: no Significant weight loss or gain: no Insomnia: no  Fatigue: yes Feelings of worthlessness or guilt: no Impaired concentration/indecisiveness: no Suicidal ideations: no Hopelessness: no Crying spells: no Depression screen [facility] 2/9 05/26/2021 12/29/2020 09/07/2020 03/23/2020 12/21/2019  Decreased Interest 0 0 0 0 0  Down, Depressed, Hopeless 0 0 0 0 0  PHQ - 2 Score 0 0 0 0 0  Altered sleeping 2 2 0 0 1  Tired, decreased energy 1 1 0 0 1  Change in appetite 0 0 0 0 1  Feeling bad or failure about yourself  0 0 0 0 0  Trouble concentrating 2 1 0 3 2  Moving slowly or fidgety/restless 0 1 0 1 1  Suicidal thoughts 0 0 0 0 0  PHQ-9 Score 5 5 0 4 6  Difficult doing work/chores - Somewhat difficult - - Somewhat difficult  Some recent data might be hidden    Relevant past medical, surgical, family and social history reviewed and updated as indicated. Interim medical history since our last visit reviewed. Allergies and medications reviewed and updated.  Review of Systems  Constitutional: Negative.   Respiratory: Negative.    Cardiovascular: Negative.   Gastrointestinal: Negative.   Musculoskeletal: Negative.   Neurological: Negative.   Psychiatric/Behavioral: Negative.     Per HPI unless specifically indicated above  Objective:    BP 139/86   Pulse 80   Ht 5\' 1"  (1.549 m)   Wt 180 lb (81.6 kg)   BMI 34.01 kg/m   Wt Readings from Last 3 Encounters:  05/26/21 180 lb (81.6 kg)  12/29/20 182 lb (82.6 kg)  10/10/20 176 lb 8 oz (80.1 kg)    Physical Exam Vitals and nursing  note reviewed.  Constitutional:      General: She is not in acute distress.    Appearance: Normal appearance. She is not ill-appearing, toxic-appearing or diaphoretic.  HENT:     Head: Normocephalic and atraumatic.     Right Ear: External ear normal.     Left Ear: External ear normal.     Nose: Nose normal.     Mouth/Throat:     Mouth: Mucous membranes are moist.     Pharynx: Oropharynx is clear.  Eyes:     General: No scleral icterus.       Right eye: No discharge.        Left eye: No discharge.     Extraocular Movements: Extraocular movements intact.     Conjunctiva/sclera: Conjunctivae normal.     Pupils: Pupils are equal, round, and reactive to light.  Cardiovascular:     Rate and Rhythm: Normal rate and regular rhythm.     Pulses: Normal pulses.     Heart sounds: Normal heart sounds. No murmur heard.   No friction rub. No gallop.  Pulmonary:     Effort: Pulmonary effort is normal. No respiratory distress.     Breath sounds: Normal breath sounds. No stridor. No wheezing, rhonchi or rales.  Chest:     Chest wall: No tenderness.  Musculoskeletal:        General: Normal range of motion.     Cervical back: Normal range of motion and neck supple.  Skin:    General: Skin is warm and dry.     Capillary Refill: Capillary refill takes less than 2 seconds.     Coloration: Skin is not jaundiced or pale.     Findings: No bruising, erythema, lesion or rash.  Neurological:     General: No focal deficit present.     Mental Status: She is alert and oriented to person, place, and time. Mental status is at baseline.  Psychiatric:        Mood and Affect: Mood normal.        Behavior: Behavior normal.        Thought Content: Thought content normal.        Judgment: Judgment normal.    Results for orders placed or performed in visit on 01/30/21  TSH  Result Value Ref Range   TSH 12.100 (H) 0.450 - 4.500 uIU/mL      Assessment & Plan:   Problem List Items Addressed This Visit        Cardiovascular and Mediastinum   Essential hypertension, benign    Under good control on current regimen. Continue current regimen. Continue to monitor. Call with any concerns. Refills given. Labs drawn today.       Relevant Medications   lisinopril (ZESTRIL) 10 MG tablet   metoprolol succinate (TOPROL-XL) 25 MG 24 hr tablet   Other Relevant Orders   CBC with Differential/Platelet   Comprehensive metabolic panel   Microalbumin, Urine Waived   Urinalysis, Routine w reflex microscopic     Digestive   GERD (gastroesophageal reflux disease)    Under good control on current regimen. Continue  current regimen. Continue to monitor. Call with any concerns. Refills given. Labs drawn today.       Relevant Medications   omeprazole (PRILOSEC) 20 MG capsule   Other Relevant Orders   CBC with Differential/Platelet   Comprehensive metabolic panel     Endocrine   Hypothyroidism    Labs drawn today. Await results. Treat as needed.       Relevant Medications   metoprolol succinate (TOPROL-XL) 25 MG 24 hr tablet   Other Relevant Orders   CBC with Differential/Platelet   Comprehensive metabolic panel   TSH     Other   ADHD (attention deficit hyperactivity disorder) - Primary    Not doing great at the end of the day. Will add 5mg  adderall short acting PRN for when she is having to work late. 3 months of meds sent through to her pharmacy. Continue to monitor.       Relevant Orders   CBC with Differential/Platelet   Comprehensive metabolic panel   Anxiety and depression    Under good control on current regimen. Continue current regimen. Continue to monitor. Call with any concerns. Refills given. Labs drawn today.       Relevant Medications   buPROPion (WELLBUTRIN XL) 150 MG 24 hr tablet   busPIRone (BUSPAR) 5 MG tablet   escitalopram (LEXAPRO) 20 MG tablet   Other Relevant Orders   CBC with Differential/Platelet   Comprehensive metabolic panel   Other Visit Diagnoses      Screening for cholesterol level       Labs drawn today. Await results. Treat as needed.    Relevant Orders   Lipid Panel w/o Chol/HDL Ratio        Follow up plan: Return in about 3 months (around 08/26/2021).

## 2021-05-26 NOTE — Assessment & Plan Note (Signed)
Not doing great at the end of the day. Will add 5mg  adderall short acting PRN for when she is having to work late. 3 months of meds sent through to her pharmacy. Continue to monitor.

## 2021-05-26 NOTE — Assessment & Plan Note (Signed)
Labs drawn today. Await results. Treat as needed.  

## 2021-05-27 LAB — CBC WITH DIFFERENTIAL/PLATELET
Basophils Absolute: 0.1 10*3/uL (ref 0.0–0.2)
Basos: 1 %
EOS (ABSOLUTE): 0.2 10*3/uL (ref 0.0–0.4)
Eos: 5 %
Hematocrit: 40.3 % (ref 34.0–46.6)
Hemoglobin: 13.3 g/dL (ref 11.1–15.9)
Immature Grans (Abs): 0 10*3/uL (ref 0.0–0.1)
Immature Granulocytes: 0 %
Lymphocytes Absolute: 1.9 10*3/uL (ref 0.7–3.1)
Lymphs: 40 %
MCH: 30.9 pg (ref 26.6–33.0)
MCHC: 33 g/dL (ref 31.5–35.7)
MCV: 94 fL (ref 79–97)
Monocytes Absolute: 0.4 10*3/uL (ref 0.1–0.9)
Monocytes: 9 %
Neutrophils Absolute: 2.3 10*3/uL (ref 1.4–7.0)
Neutrophils: 45 %
Platelets: 241 10*3/uL (ref 150–450)
RBC: 4.3 x10E6/uL (ref 3.77–5.28)
RDW: 12.8 % (ref 11.7–15.4)
WBC: 4.9 10*3/uL (ref 3.4–10.8)

## 2021-05-27 LAB — COMPREHENSIVE METABOLIC PANEL
ALT: 67 IU/L — ABNORMAL HIGH (ref 0–32)
AST: 48 IU/L — ABNORMAL HIGH (ref 0–40)
Albumin/Globulin Ratio: 2.2 (ref 1.2–2.2)
Albumin: 4.4 g/dL (ref 3.8–4.8)
Alkaline Phosphatase: 65 IU/L (ref 44–121)
BUN/Creatinine Ratio: 18 (ref 9–23)
BUN: 13 mg/dL (ref 6–24)
Bilirubin Total: 0.2 mg/dL (ref 0.0–1.2)
CO2: 23 mmol/L (ref 20–29)
Calcium: 9.1 mg/dL (ref 8.7–10.2)
Chloride: 102 mmol/L (ref 96–106)
Creatinine, Ser: 0.72 mg/dL (ref 0.57–1.00)
Globulin, Total: 2 g/dL (ref 1.5–4.5)
Glucose: 92 mg/dL (ref 70–99)
Potassium: 4.2 mmol/L (ref 3.5–5.2)
Sodium: 140 mmol/L (ref 134–144)
Total Protein: 6.4 g/dL (ref 6.0–8.5)
eGFR: 102 mL/min/{1.73_m2} (ref >59)

## 2021-05-27 LAB — TSH: TSH: 0.399 u[IU]/mL — ABNORMAL LOW (ref 0.450–4.500)

## 2021-05-27 LAB — LIPID PANEL W/O CHOL/HDL RATIO
Cholesterol, Total: 165 mg/dL (ref 100–199)
HDL: 53 mg/dL (ref >39)
LDL Chol Calc (NIH): 94 mg/dL (ref 0–99)
Triglycerides: 99 mg/dL (ref 0–149)
VLDL Cholesterol Cal: 18 mg/dL (ref 5–40)

## 2021-05-29 ENCOUNTER — Other Ambulatory Visit: Payer: Self-pay | Admitting: Family Medicine

## 2021-05-29 DIAGNOSIS — E038 Other specified hypothyroidism: Secondary | ICD-10-CM

## 2021-06-08 ENCOUNTER — Other Ambulatory Visit: Payer: Self-pay | Admitting: Family Medicine

## 2021-06-08 ENCOUNTER — Encounter: Payer: Self-pay | Admitting: Family Medicine

## 2021-06-08 MED ORDER — LEVOTHYROXINE SODIUM 125 MCG PO TABS: 125.0000 ug | ORAL_TABLET | Freq: Every day | ORAL | 2 refills | Status: DC

## 2021-08-08 ENCOUNTER — Telehealth: Payer: Self-pay

## 2021-08-08 ENCOUNTER — Telehealth: Payer: BC Managed Care – PPO | Admitting: Nurse Practitioner

## 2021-08-08 NOTE — Telephone Encounter (Signed)
Noted. Dr. Laural Benes made aware as well as she is her PCP.

## 2021-08-08 NOTE — Telephone Encounter (Signed)
Patient spoke with [facility] and got scheduled for an virtual appointment today with provider Rodman Pickle, NP. Patient was told to come to our office and wait outside for a swab. Per Leotis Shames, there was no triage note from [facility] as to what patient was coming to be swabbed for. Per Leotis Shames, asked clinical staff to reach out to patient to come in for a visit to have orders for swabs to be ordered in patient's chart. Called patient and patient rudely answered the phone with "what" and while speaking with patient, I apologized to patient for the miscommunication as we did not know what she was coming in for other than a "swab." Patient was very rude and aggressive and states she was not coming in to be seen and left the parking lot and patient stated "have a nice day and hung up the phone."

## 2021-08-09 DIAGNOSIS — Z20822 Contact with and (suspected) exposure to covid-19: Secondary | ICD-10-CM | POA: Diagnosis not present

## 2021-08-09 DIAGNOSIS — Z03818 Encounter for observation for suspected exposure to other biological agents ruled out: Secondary | ICD-10-CM | POA: Diagnosis not present

## 2021-08-14 DIAGNOSIS — Z03818 Encounter for observation for suspected exposure to other biological agents ruled out: Secondary | ICD-10-CM | POA: Diagnosis not present

## 2021-08-14 DIAGNOSIS — Z20822 Contact with and (suspected) exposure to covid-19: Secondary | ICD-10-CM | POA: Diagnosis not present

## 2021-08-29 ENCOUNTER — Ambulatory Visit: Payer: BC Managed Care – PPO | Admitting: Family Medicine

## 2021-09-04 ENCOUNTER — Encounter: Payer: Self-pay | Admitting: Family Medicine

## 2021-09-12 ENCOUNTER — Other Ambulatory Visit: Payer: Self-pay | Admitting: Family Medicine

## 2021-09-12 NOTE — Telephone Encounter (Signed)
Requested medications are due for refill today.  yes  Requested medications are on the active medications list.  yes  Last refill. 06/08/2021  Future visit scheduled.   yes  Notes to clinic.  Filed protocol d/t abnormal labs.    Requested Prescriptions  Pending Prescriptions Disp Refills   levothyroxine (SYNTHROID) 125 MCG tablet [Pharmacy Med Name: LEVOTHYROXINE 125 MCG TABLET] 30 tablet 2    Sig: TAKE 1 TABLET BY MOUTH EVERY DAY     Endocrinology:  Hypothyroid Agents Failed - 09/12/2021  1:37 AM      Failed - TSH needs to be rechecked within 3 months after an abnormal result. Refill until TSH is due.      Failed - TSH in normal range and within 360 days    TSH  Date Value Ref Range Status  05/26/2021 0.399 (L) 0.450 - 4.500 uIU/mL Final          Passed - Valid encounter within last 12 months    Recent Outpatient Visits           3 months ago Attention deficit hyperactivity disorder (ADHD), predominantly hyperactive type   [facility] [place], Megan P, DO   8 months ago Essential hypertension, benign   [facility] [place], Megan P, DO   11 months ago Chest pain, unspecified type   [facility] [place], Megan P, DO   1 year ago Lab test positive for detection of COVID-19 virus   [facility] [place], Summers T, NP   1 year ago Suspected COVID-19 virus infection   [facility] [place], Sherman, DO       Future Appointments             In 1 week Laural Benes, Oralia Rud, DO [facility], [facility]

## 2021-09-14 NOTE — Telephone Encounter (Signed)
Lmom asking pt to call back to schedule lab appt 

## 2021-09-14 NOTE — Telephone Encounter (Signed)
Overdue for labs. Order is in. She needs to come get it drawn.

## 2021-09-15 NOTE — Telephone Encounter (Signed)
Pt states she thinks she is good to wait until her appt on 2/09 and if she isnt then she will call back to schedule lab appt.

## 2021-09-21 ENCOUNTER — Other Ambulatory Visit: Payer: Self-pay

## 2021-09-21 ENCOUNTER — Encounter: Payer: Self-pay | Admitting: Family Medicine

## 2021-09-21 ENCOUNTER — Ambulatory Visit (INDEPENDENT_AMBULATORY_CARE_PROVIDER_SITE_OTHER): Payer: BC Managed Care – PPO | Admitting: Family Medicine

## 2021-09-21 VITALS — BP 123/82 | HR 96 | Ht 61.0 in | Wt 181.2 lb

## 2021-09-21 DIAGNOSIS — E038 Other specified hypothyroidism: Secondary | ICD-10-CM

## 2021-09-21 DIAGNOSIS — E049 Nontoxic goiter, unspecified: Secondary | ICD-10-CM

## 2021-09-21 DIAGNOSIS — F901 Attention-deficit hyperactivity disorder, predominantly hyperactive type: Secondary | ICD-10-CM

## 2021-09-21 MED ORDER — AMPHETAMINE-DEXTROAMPHET ER 10 MG PO CP24: 10.0000 mg | ORAL_CAPSULE | Freq: Every day | ORAL | 0 refills | Status: DC

## 2021-09-21 MED ORDER — AMPHETAMINE-DEXTROAMPHETAMINE 5 MG PO TABS: 5.0000 mg | ORAL_TABLET | Freq: Every day | ORAL | 0 refills | Status: DC

## 2021-09-21 MED ORDER — AMPHETAMINE-DEXTROAMPHET ER 30 MG PO CP24: 30.0000 mg | ORAL_CAPSULE | ORAL | 0 refills | Status: DC

## 2021-09-21 NOTE — Progress Notes (Signed)
BP 123/82    Pulse 96    Ht '5\' 1"'  (1.549 m)    Wt 181 lb 3.2 oz (82.2 kg)    SpO2 97%    BMI 34.24 kg/m    Subjective:    Patient ID: Pamela Avila, female    DOB: 1971-01-20, 51 y.o.   MRN: [id number]  HPI: LONNETTE Avila is a 51 y.o. female  Chief Complaint  Patient presents with   ADHD   ADHD FOLLOW UP ADHD status: stable- not working for the full 12 hours Satisfied with current therapy: yes Medication compliance:  excellent compliance Controlled substance contract: yes Previous psychiatry evaluation: no Previous medications: yes adderall, adderall XR, and stratera (atomoxetine)   Taking meds on weekends/vacations: yes Work/school performance:  excellent Difficulty sustaining attention/completing tasks: no Distracted by extraneous stimuli: no Does not listen when spoken to: no  Fidgets with hands or feet: no Unable to stay in seat: no Blurts out/interrupts others: no ADHD Medication Side Effects: no    Decreased appetite: no    Headache: no    Sleeping disturbance pattern: no    Irritability: no    Rebound effects (worse than baseline) off medication: no    Anxiousness: yes    Dizziness: no    Tics: no  HYPOTHYROIDISM Thyroid control status: unsure Satisfied with current treatment? unsure Medication side effects: no Medication compliance: excellent compliance Recent dose adjustment:yes Fatigue: yes Cold intolerance: no Heat intolerance: no Weight gain: no Weight loss: no Constipation: no Diarrhea/loose stools: no Palpitations: no Lower extremity edema: no Anxiety/depressed mood: yes   Relevant past medical, surgical, family and social history reviewed and updated as indicated. Interim medical history since our last visit reviewed. Allergies and medications reviewed and updated.  Review of Systems  Constitutional: Negative.   HENT:  Positive for trouble swallowing. Negative for congestion, dental problem, drooling, ear discharge, ear pain, facial  swelling, hearing loss, mouth sores, nosebleeds, postnasal drip, rhinorrhea, sinus pressure, sinus pain, sneezing, sore throat, tinnitus and voice change.   Respiratory: Negative.    Cardiovascular: Negative.   Gastrointestinal: Negative.   Musculoskeletal: Negative.   Skin: Negative.   Neurological: Negative.   Psychiatric/Behavioral:  Positive for agitation. Negative for behavioral problems, confusion, decreased concentration, dysphoric mood, hallucinations, self-injury, sleep disturbance and suicidal ideas. The patient is nervous/anxious. The patient is not hyperactive.    Per HPI unless specifically indicated above     Objective:    BP 123/82    Pulse 96    Ht '5\' 1"'  (1.549 m)    Wt 181 lb 3.2 oz (82.2 kg)    SpO2 97%    BMI 34.24 kg/m   Wt Readings from Last 3 Encounters:  09/21/21 181 lb 3.2 oz (82.2 kg)  05/26/21 180 lb (81.6 kg)  12/29/20 182 lb (82.6 kg)    Physical Exam Vitals and nursing note reviewed.  Constitutional:      General: She is not in acute distress.    Appearance: Normal appearance. She is not ill-appearing, toxic-appearing or diaphoretic.  HENT:     Head: Normocephalic and atraumatic.     Right Ear: External ear normal.     Left Ear: External ear normal.     Nose: Nose normal.     Mouth/Throat:     Mouth: Mucous membranes are moist.     Pharynx: Oropharynx is clear.  Eyes:     General: No scleral icterus.       Right eye: No discharge.  Left eye: No discharge.     Extraocular Movements: Extraocular movements intact.     Conjunctiva/sclera: Conjunctivae normal.     Pupils: Pupils are equal, round, and reactive to light.  Neck:   Cardiovascular:     Rate and Rhythm: Normal rate and regular rhythm.     Pulses: Normal pulses.     Heart sounds: Normal heart sounds. No murmur heard.   No friction rub. No gallop.  Pulmonary:     Effort: Pulmonary effort is normal. No respiratory distress.     Breath sounds: Normal breath sounds. No stridor. No  wheezing, rhonchi or rales.  Chest:     Chest wall: No tenderness.  Musculoskeletal:        General: Normal range of motion.     Cervical back: Normal range of motion and neck supple.  Skin:    General: Skin is warm and dry.     Capillary Refill: Capillary refill takes less than 2 seconds.     Coloration: Skin is not jaundiced or pale.     Findings: No bruising, erythema, lesion or rash.  Neurological:     General: No focal deficit present.     Mental Status: She is alert and oriented to person, place, and time. Mental status is at baseline.  Psychiatric:        Mood and Affect: Mood normal.        Behavior: Behavior normal.        Thought Content: Thought content normal.        Judgment: Judgment normal.    Results for orders placed or performed in visit on 05/26/21  CBC with Differential/Platelet  Result Value Ref Range   WBC 4.9 3.4 - 10.8 x10E3/uL   RBC 4.30 3.77 - 5.28 x10E6/uL   Hemoglobin 13.3 11.1 - 15.9 g/dL   Hematocrit 40.3 34.0 - 46.6 %   MCV 94 79 - 97 fL   MCH 30.9 26.6 - 33.0 pg   MCHC 33.0 31.5 - 35.7 g/dL   RDW 12.8 11.7 - 15.4 %   Platelets 241 150 - 450 x10E3/uL   Neutrophils 45 Not Estab. %   Lymphs 40 Not Estab. %   Monocytes 9 Not Estab. %   Eos 5 Not Estab. %   Basos 1 Not Estab. %   Neutrophils Absolute 2.3 1.4 - 7.0 x10E3/uL   Lymphocytes Absolute 1.9 0.7 - 3.1 x10E3/uL   Monocytes Absolute 0.4 0.1 - 0.9 x10E3/uL   EOS (ABSOLUTE) 0.2 0.0 - 0.4 x10E3/uL   Basophils Absolute 0.1 0.0 - 0.2 x10E3/uL   Immature Granulocytes 0 Not Estab. %   Immature Grans (Abs) 0.0 0.0 - 0.1 x10E3/uL  Comprehensive metabolic panel  Result Value Ref Range   Glucose 92 70 - 99 mg/dL   BUN 13 6 - 24 mg/dL   Creatinine, Ser 0.72 0.57 - 1.00 mg/dL   eGFR 102 >59 mL/min/1.73   BUN/Creatinine Ratio 18 9 - 23   Sodium 140 134 - 144 mmol/L   Potassium 4.2 3.5 - 5.2 mmol/L   Chloride 102 96 - 106 mmol/L   CO2 23 20 - 29 mmol/L   Calcium 9.1 8.7 - 10.2 mg/dL   Total  Protein 6.4 6.0 - 8.5 g/dL   Albumin 4.4 3.8 - 4.8 g/dL   Globulin, Total 2.0 1.5 - 4.5 g/dL   Albumin/Globulin Ratio 2.2 1.2 - 2.2   Bilirubin Total 0.2 0.0 - 1.2 mg/dL   Alkaline Phosphatase 65 44 - 121  IU/L   AST 48 (H) 0 - 40 IU/L   ALT 67 (H) 0 - 32 IU/L  TSH  Result Value Ref Range   TSH 0.399 (L) 0.450 - 4.500 uIU/mL  Lipid Panel w/o Chol/HDL Ratio  Result Value Ref Range   Cholesterol, Total 165 100 - 199 mg/dL   Triglycerides 99 0 - 149 mg/dL   HDL 53 >39 mg/dL   VLDL Cholesterol Cal 18 5 - 40 mg/dL   LDL Chol Calc (NIH) 94 0 - 99 mg/dL  Microalbumin, Urine Waived  Result Value Ref Range   Microalb, Ur Waived 30 (H) 0 - 19 mg/L   Creatinine, Urine Waived 300 10 - 300 mg/dL   Microalb/Creat Ratio <30 <30 mg/g  Urinalysis, Routine w reflex microscopic  Result Value Ref Range   Specific Gravity, UA 1.025 1.005 - 1.030   pH, UA 6.0 5.0 - 7.5   Color, UA Yellow Yellow   Appearance Ur Clear Clear   Leukocytes,UA Negative Negative   Protein,UA Negative Negative/Trace   Glucose, UA Negative Negative   Ketones, UA Negative Negative   RBC, UA Negative Negative   Bilirubin, UA Negative Negative   Urobilinogen, Ur 0.2 0.2 - 1.0 mg/dL   Nitrite, UA Negative Negative      Assessment & Plan:   Problem List Items Addressed This Visit       Endocrine   Hypothyroidism    Due for recheck on her labs. Drawn today. Await results.       Relevant Orders   US THYROID     Other   ADHD (attention deficit hyperactivity disorder)    Under good control on current regimen. Continue current regimen. Continue to monitor. Call with any concerns. Refills given given for 3 months. Follow up 3 months.        Other Visit Diagnoses     Goiter    -  Primary   Will get her set up for thyroid US. Await results. Treat as needed.    Relevant Orders   US THYROID        Follow up plan: Return in about 3 months (around 12/19/2021) for physical (before 12/20/21 so she doesn't run out of  her medicine).

## 2021-09-21 NOTE — Assessment & Plan Note (Signed)
Due for recheck on her labs. Drawn today. Await results.  

## 2021-09-21 NOTE — Assessment & Plan Note (Signed)
Under good control on current regimen. Continue current regimen. Continue to monitor. Call with any concerns. Refills given given for 3 months. Follow up 3 months.

## 2021-09-22 ENCOUNTER — Other Ambulatory Visit: Payer: Self-pay | Admitting: Family Medicine

## 2021-09-22 DIAGNOSIS — E038 Other specified hypothyroidism: Secondary | ICD-10-CM

## 2021-09-22 LAB — TSH: TSH: 0.38 u[IU]/mL — ABNORMAL LOW (ref 0.450–4.500)

## 2021-09-22 MED ORDER — LEVOTHYROXINE SODIUM 112 MCG PO TABS: 112.0000 ug | ORAL_TABLET | Freq: Every day | ORAL | 1 refills | Status: DC

## 2021-09-28 ENCOUNTER — Ambulatory Visit: Payer: BC Managed Care – PPO

## 2021-10-05 ENCOUNTER — Ambulatory Visit: Payer: BC Managed Care – PPO

## 2021-10-06 ENCOUNTER — Encounter: Payer: Self-pay | Admitting: Family Medicine

## 2021-10-09 NOTE — Telephone Encounter (Signed)
Can we call [facility] and see if they'd cover 2 of the 20s?

## 2021-10-09 NOTE — Telephone Encounter (Unsigned)
Copied from CRM [id number]. Topic: Quick Communication - Rx Refill/Question >> Oct 09, 2021  4:20 PM Pawlus, Pamela Avila wrote: Pt was calling to follow up on MyChart messages sent on 2/24, pt stated she has not been able to find [facility] that has amphetamine-dextroamphetamine (ADDERALL XR) 10 MG 24 hr capsule in stock, please advise.

## 2021-10-12 ENCOUNTER — Ambulatory Visit: Payer: Self-pay | Admitting: *Deleted

## 2021-10-12 MED ORDER — AMPHETAMINE-DEXTROAMPHET ER 20 MG PO CP24: 40.0000 mg | ORAL_CAPSULE | ORAL | 0 refills | Status: DC

## 2021-10-12 NOTE — Telephone Encounter (Signed)
Got a message back this AM that [facility] has the 20mg  XR in stock. Rx sent to her pharmacy ?

## 2021-10-12 NOTE — Telephone Encounter (Signed)
Routing to provider to advise.  

## 2021-10-12 NOTE — Addendum Note (Signed)
Addended by: Dorcas Carrow on: 10/12/2021 05:04 PM ? ? Modules accepted: Orders ? ?

## 2021-10-12 NOTE — Telephone Encounter (Signed)
Summary: Patient medication not available  ? Pt was calling to follow up her message left on 10/09/21 when pt stated she has not been able to find a pharmacy that has amphetamine-dextroamphetamine (ADDERALL XR) 10 MG 24 hr capsule /  amphetamine-dextroamphetamine (ADDERALL XR) 30 MG 24 hr capsule in stock, please advise. 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 Please call Ph# [phone]   ?  ? ?Reason for Disposition ? [1] Caller has URGENT medicine question about med that PCP or specialist prescribed AND [2] triager unable to answer question ? ?Answer Assessment - Initial Assessment Questions ?1. NAME of MEDICATION: "What medicine are you calling about?" ?    Adderall ?2. QUESTION: "What is your question?" (e.g., double dose of medicine, side effect) ?    Medication is on back order and patient has not been able to find a pharmacy that has it in stock ?3. PRESCRIBING HCP: "Who prescribed it?" Reason: if prescribed by specialist, call should be referred to that group. ?    PCP ?Patient is looking for advice on how to get medication or does she need alternative medication prescribed ? ?Protocols used: Medication Question Call-A-AH ? ?

## 2021-10-13 ENCOUNTER — Other Ambulatory Visit: Payer: Self-pay | Admitting: Family Medicine

## 2021-10-13 ENCOUNTER — Telehealth: Payer: Self-pay

## 2021-10-13 MED ORDER — AMPHETAMINE-DEXTROAMPHET ER 10 MG PO CP24: 10.0000 mg | ORAL_CAPSULE | Freq: Every day | ORAL | 0 refills | Status: DC

## 2021-10-13 MED ORDER — AMPHETAMINE-DEXTROAMPHET ER 30 MG PO CP24: 30.0000 mg | ORAL_CAPSULE | ORAL | 0 refills | Status: DC

## 2021-10-13 NOTE — Telephone Encounter (Signed)
Pt calling in to check to see if Adderall 10mg  was sent to [facility]. At first I advised pt that I didn't see the rx but then pt's chart updated and advised her that Adderall 10mg  was sent to [facility] in [place].  ?

## 2021-10-17 ENCOUNTER — Other Ambulatory Visit: Payer: Self-pay | Admitting: Family Medicine

## 2021-10-18 NOTE — Telephone Encounter (Signed)
Requested Prescriptions  ?Pending Prescriptions Disp Refills  ?? levothyroxine (SYNTHROID) 112 MCG tablet [Pharmacy Med Name: LEVOTHYROXINE 112 MCG TABLET] 30 tablet 0  ?  Sig: TAKE 1 TABLET BY MOUTH EVERY DAY  ?  ? Endocrinology:  Hypothyroid Agents Failed - 10/17/2021 11:33 AM  ?  ?  Failed - TSH in normal range and within 360 days  ?  TSH  ?Date Value Ref Range Status  ?09/21/2021 0.380 (L) 0.450 - 4.500 uIU/mL Final  ?   ?  ?  Passed - Valid encounter within last 12 months  ?  Recent Outpatient Visits   ?      ? 3 weeks ago Goiter  ? [place], Megan P, DO  ? 4 months ago Attention deficit hyperactivity disorder (ADHD), predominantly hyperactive type  ? [place], Megan P, DO  ? 9 months ago Essential hypertension, benign  ? [place] P, DO  ? 1 year ago Chest pain, unspecified type  ? [place], Megan P, DO  ? 1 year ago Lab test positive for detection of COVID-19 virus  ? [facility] [place], Henrine Screws T, NP  ?  ?  ?Future Appointments   ?        ? In 1 month Johnson, Barb Merino, DO [facility]  ?  ? ?  ?  ?  ? ?

## 2021-11-01 ENCOUNTER — Other Ambulatory Visit: Payer: Self-pay | Admitting: Family Medicine

## 2021-11-02 NOTE — Telephone Encounter (Signed)
Requested medication (s) are due for refill today: Yes ? ?Requested medication (s) are on the active medication list: Yes ? ?Last refill:  10/18/21 ? ?Future visit scheduled: Yes ? ?Notes to clinic:  Pharmacy requesting 90 day supply, awaiting repeat labs, unsure if provider wants to refill, routing for approval ? ? ? ? ?Requested Prescriptions  ?Pending Prescriptions Disp Refills  ? levothyroxine (SYNTHROID) 112 MCG tablet [Pharmacy Med Name: LEVOTHYROXINE 112 MCG TABLET] 90 tablet 1  ?  Sig: TAKE 1 TABLET BY MOUTH EVERY DAY  ?  ? Endocrinology:  Hypothyroid Agents Failed - 11/01/2021  9:10 AM  ?  ?  Failed - TSH in normal range and within 360 days  ?  TSH  ?Date Value Ref Range Status  ?09/21/2021 0.380 (L) 0.450 - 4.500 uIU/mL Final  ?  ?  ?  ?  Passed - Valid encounter within last 12 months  ?  Recent Outpatient Visits   ? ?      ? 1 month ago Goiter  ? [facility], Megan P, DO  ? 5 months ago Attention deficit hyperactivity disorder (ADHD), predominantly hyperactive type  ? [facility], Megan P, DO  ? 10 months ago Essential hypertension, benign  ? [facility], Megan P, DO  ? 1 year ago Chest pain, unspecified type  ? [facility] [place], Megan P, DO  ? 1 year ago Lab test positive for detection of COVID-19 virus  ? [facility], Corrie Dandy T, NP  ? ?  ?  ?Future Appointments   ? ?        ? In 1 month Johnson, Oralia Rud, DO [facility]  ? ?  ? ?  ?  ?  ? ? ? ? ?

## 2021-11-05 NOTE — Telephone Encounter (Signed)
She needs to come in and get her labs drawn. Order is already in. ?

## 2021-11-06 ENCOUNTER — Other Ambulatory Visit: Payer: Self-pay

## 2021-11-06 ENCOUNTER — Ambulatory Visit
Admission: RE | Admit: 2021-11-06 | Discharge: 2021-11-06 | Disposition: A | Discharge status: Home / Self Care | Payer: BC Managed Care – PPO | Source: Ambulatory Visit | Attending: Family Medicine | Admitting: Family Medicine

## 2021-11-06 DIAGNOSIS — E038 Other specified hypothyroidism: Secondary | ICD-10-CM | POA: Diagnosis not present

## 2021-11-06 DIAGNOSIS — E049 Nontoxic goiter, unspecified: Secondary | ICD-10-CM | POA: Diagnosis not present

## 2021-11-06 DIAGNOSIS — E039 Hypothyroidism, unspecified: Secondary | ICD-10-CM | POA: Diagnosis not present

## 2021-11-07 ENCOUNTER — Other Ambulatory Visit: Payer: BC Managed Care – PPO

## 2021-11-07 NOTE — Telephone Encounter (Signed)
Patient coming this afternoon for labs. Routing to provider for refill after labs.  ?

## 2021-11-10 NOTE — Telephone Encounter (Signed)
Has not come in for labs.  ?

## 2021-11-14 ENCOUNTER — Other Ambulatory Visit: Payer: Self-pay | Admitting: Family Medicine

## 2021-11-17 ENCOUNTER — Encounter: Payer: Self-pay | Admitting: Family Medicine

## 2021-11-20 MED ORDER — AMPHETAMINE-DEXTROAMPHET ER 10 MG PO CP24: 10.0000 mg | ORAL_CAPSULE | Freq: Every day | ORAL | 0 refills | Status: DC

## 2021-12-05 ENCOUNTER — Ambulatory Visit (INDEPENDENT_AMBULATORY_CARE_PROVIDER_SITE_OTHER): Payer: BC Managed Care – PPO | Admitting: Family Medicine

## 2021-12-05 ENCOUNTER — Encounter: Payer: Self-pay | Admitting: Family Medicine

## 2021-12-05 VITALS — BP 120/82 | HR 105 | Temp 98.0°F | Ht 60.25 in | Wt 187.4 lb

## 2021-12-05 DIAGNOSIS — Z0000 Encounter for general adult medical examination without abnormal findings: Secondary | ICD-10-CM

## 2021-12-05 DIAGNOSIS — F419 Anxiety disorder, unspecified: Secondary | ICD-10-CM

## 2021-12-05 DIAGNOSIS — E038 Other specified hypothyroidism: Secondary | ICD-10-CM | POA: Diagnosis not present

## 2021-12-05 DIAGNOSIS — K219 Gastro-esophageal reflux disease without esophagitis: Secondary | ICD-10-CM | POA: Diagnosis not present

## 2021-12-05 DIAGNOSIS — I10 Essential (primary) hypertension: Secondary | ICD-10-CM | POA: Diagnosis not present

## 2021-12-05 DIAGNOSIS — Z833 Family history of diabetes mellitus: Secondary | ICD-10-CM | POA: Diagnosis not present

## 2021-12-05 DIAGNOSIS — F901 Attention-deficit hyperactivity disorder, predominantly hyperactive type: Secondary | ICD-10-CM | POA: Diagnosis not present

## 2021-12-05 DIAGNOSIS — F32A Depression, unspecified: Secondary | ICD-10-CM

## 2021-12-05 DIAGNOSIS — Z1231 Encounter for screening mammogram for malignant neoplasm of breast: Secondary | ICD-10-CM

## 2021-12-05 MED ORDER — LISINOPRIL 10 MG PO TABS: 10.0000 mg | ORAL_TABLET | Freq: Every day | ORAL | 1 refills | Status: DC

## 2021-12-05 MED ORDER — METOPROLOL SUCCINATE ER 25 MG PO TB24: ORAL_TABLET | ORAL | 1 refills | Status: DC

## 2021-12-05 MED ORDER — AMPHETAMINE-DEXTROAMPHET ER 10 MG PO CP24: 10.0000 mg | ORAL_CAPSULE | Freq: Every day | ORAL | 0 refills | Status: DC

## 2021-12-05 MED ORDER — MELOXICAM 15 MG PO TABS: 15.0000 mg | ORAL_TABLET | Freq: Every day | ORAL | 1 refills | Status: DC

## 2021-12-05 MED ORDER — ATOMOXETINE HCL 80 MG PO CAPS: 80.0000 mg | ORAL_CAPSULE | Freq: Every day | ORAL | 1 refills | Status: DC

## 2021-12-05 MED ORDER — ALBUTEROL SULFATE HFA 108 (90 BASE) MCG/ACT IN AERS
2.0000 | INHALATION_SPRAY | Freq: Four times a day (QID) | RESPIRATORY_TRACT | 6 refills | Status: DC | PRN
Start: 2021-12-05 — End: 2022-03-12

## 2021-12-05 MED ORDER — BUPROPION HCL ER (XL) 150 MG PO TB24: ORAL_TABLET | ORAL | 1 refills | Status: DC

## 2021-12-05 MED ORDER — OMEPRAZOLE 20 MG PO CPDR: DELAYED_RELEASE_CAPSULE | ORAL | 1 refills | Status: DC

## 2021-12-05 MED ORDER — BUSPIRONE HCL 5 MG PO TABS: 5.0000 mg | ORAL_TABLET | Freq: Three times a day (TID) | ORAL | 1 refills | Status: DC | PRN

## 2021-12-05 MED ORDER — ESCITALOPRAM OXALATE 20 MG PO TABS: 20.0000 mg | ORAL_TABLET | Freq: Every day | ORAL | 1 refills | Status: DC

## 2021-12-05 MED ORDER — AMPHETAMINE-DEXTROAMPHETAMINE 5 MG PO TABS: 5.0000 mg | ORAL_TABLET | Freq: Every day | ORAL | 0 refills | Status: DC

## 2021-12-05 MED ORDER — DICYCLOMINE HCL 10 MG PO CAPS: 10.0000 mg | ORAL_CAPSULE | Freq: Three times a day (TID) | ORAL | 1 refills | Status: DC | PRN

## 2021-12-05 MED ORDER — AMPHETAMINE-DEXTROAMPHET ER 30 MG PO CP24: 30.0000 mg | ORAL_CAPSULE | ORAL | 0 refills | Status: DC

## 2021-12-05 NOTE — Progress Notes (Signed)
? ?BP 120/82   Pulse (!) 105   Temp 98 ?F (36.7 ?C)   Ht 5' 0.25" (1.53 m)   Wt 187 lb 6.4 oz (85 kg)   SpO2 99%   BMI 36.30 kg/m?   ? ?Subjective:  ? ? Patient ID: Pamela Avila, female    DOB: November 28, 1970, 51 y.o.   MRN: [id number] ? ?HPI: ?Pamela Avila is a 51 y.o. female presenting on 12/05/2021 for comprehensive medical examination. Current medical complaints include: ? ?HYPERTENSION ?Hypertension status: controlled  ?Satisfied with current treatment? yes ?Duration of hypertension: chronic ?BP monitoring frequency:  not checking ?BP medication side effects:  no ?Medication compliance: excellent compliance ?Previous BP meds: lisinopril, metoprolol ?Aspirin: no ?Recurrent headaches: no ?Visual changes: no ?Palpitations: no ?Dyspnea: no ?Chest pain: no ?Lower extremity edema: no ?Dizzy/lightheaded: no ? ?HYPOTHYROIDISM ?Thyroid control status:controlled ?Satisfied with current treatment? yes ?Medication side effects: no ?Medication compliance: fair compliance- missed a few days recently ?Recent dose adjustment:yes ?Fatigue: no ?Cold intolerance: no ?Heat intolerance: no ?Weight gain: no ?Weight loss: no ?Constipation: no ?Diarrhea/loose stools: no ?Palpitations: no ?Lower extremity edema: no ?Anxiety/depressed mood: no ? ?DEPRESSION ?Mood status: stable ?Satisfied with current treatment?: yes ?Symptom severity: mild  ?Duration of current treatment : chronic ?Side effects: no ?Medication compliance: excellent compliance ?Psychotherapy/counseling: no  ?Previous psychiatric medications:  ?Depressed mood: yes ?Anxious mood: yes ?Anhedonia: no ?Significant weight loss or gain: no ?Insomnia: no  ?Fatigue: yes ?Feelings of worthlessness or guilt: no ?Impaired concentration/indecisiveness: no ?Suicidal ideations: no ?Hopelessness: no ?Crying spells: no ? ?  12/05/2021  ?  4:19 PM 09/21/2021  ?  3:59 PM 05/26/2021  ? 11:16 AM 12/29/2020  ?  1:51 PM 09/07/2020  ?  4:02 PM  ?Depression screen PHQ 2/9  ?Decreased Interest 1 0 0  0 0  ?Down, Depressed, Hopeless 1 0 0 0 0  ?PHQ - 2 Score 2 0 0 0 0  ?Altered sleeping 1 1 2 2  0  ?Tired, decreased energy 1 1 1 1  0  ?Change in appetite 1 0 0 0 0  ?Feeling bad or failure about yourself  1 0 0 0 0  ?Trouble concentrating 0 3 2 1  0  ?Moving slowly or fidgety/restless 0 1 0 1 0  ?Suicidal thoughts 0 0 0 0 0  ?PHQ-9 Score 6 6 5 5  0  ?Difficult doing work/chores  Not difficult at all  Somewhat difficult   ? ? ?  12/05/2021  ?  4:20 PM 09/21/2021  ?  4:01 PM 05/26/2021  ? 11:17 AM 12/29/2020  ?  1:52 PM  ?GAD 7 : Generalized Anxiety Score  ?Nervous, Anxious, on Edge 1 1 0 1  ?Control/stop worrying 1 0 0 0  ?Worry too much - different things 1 1 0 0  ?Trouble relaxing 1 0 0 1  ?Restless 0 0 0 0  ?Easily annoyed or irritable 0 1 1 0  ?Afraid - awful might happen 1 0 0 0  ?Total GAD 7 Score 5 3 1 2   ?Anxiety Difficulty Somewhat difficult Not difficult at all  Not difficult at all  ? ? ?ADHD FOLLOW UP ?ADHD status: controlled ?Satisfied with current therapy: yes ?Medication compliance:  excellent compliance ?Controlled substance contract: yes ?Previous psychiatry evaluation: no ?Previous medications: no adderall   ?Taking meds on weekends/vacations: yes ?Work/school performance:  good ?Difficulty sustaining attention/completing tasks: no ?Distracted by extraneous stimuli: no ?Does not listen when spoken to: no  ?Fidgets with hands or feet: no ?  Unable to stay in seat: no ?Blurts out/interrupts others: no ?ADHD Medication Side Effects: no ?   Decreased appetite: no ?   Headache: no ?   Sleeping disturbance pattern: no ?   Irritability: no ?   Rebound effects (worse than baseline) off medication: no ?   Anxiousness: no ?   Dizziness: no ?   Tics: no ? ? ? ?Menopausal Symptoms: no ? ?Depression Screen done today and results listed below:  ? ?  12/05/2021  ?  4:19 PM 09/21/2021  ?  3:59 PM 05/26/2021  ? 11:16 AM 12/29/2020  ?  1:51 PM 09/07/2020  ?  4:02 PM  ?Depression screen PHQ 2/9  ?Decreased Interest 1 0 0 0 0   ?Down, Depressed, Hopeless 1 0 0 0 0  ?PHQ - 2 Score 2 0 0 0 0  ?Altered sleeping 1 1 2 2  0  ?Tired, decreased energy 1 1 1 1  0  ?Change in appetite 1 0 0 0 0  ?Feeling bad or failure about yourself  1 0 0 0 0  ?Trouble concentrating 0 3 2 1  0  ?Moving slowly or fidgety/restless 0 1 0 1 0  ?Suicidal thoughts 0 0 0 0 0  ?PHQ-9 Score 6 6 5 5  0  ?Difficult doing work/chores  Not difficult at all  Somewhat difficult   ? ? ?Past Medical History:  ?Past Medical History:  ?Diagnosis Date  ? ADHD (attention deficit hyperactivity disorder)   ? Anemia   ? Anxiety   ? Anxiety disorder   ? Complication of anesthesia   ? Depression   ? Family history of adverse reaction to anesthesia   ? Family history of diabetes mellitus   ? GERD (gastroesophageal reflux disease)   ? HSV (herpes simplex virus) infection   ? Hypertension   ? Hypothyroidism   ? IBS (irritable bowel syndrome)   ? Lab test positive for detection of COVID-19 virus   ? PONV (postoperative nausea and vomiting)   ? Sleep apnea   ? Thyroid disease   ? hypothroidsim  ? ? ?Surgical History:  ?Past Surgical History:  ?Procedure Laterality Date  ? BREAST EXCISIONAL BIOPSY Right 2010  ? neg surgical breast bx    ? BREAST SURGERY Right   ? lumpectomy  ? CESAREAN SECTION    ? x 2  ? COLONOSCOPY WITH PROPOFOL N/A 12/28/2016  ? Procedure: COLONOSCOPY WITH PROPOFOL;  Surgeon: Wyline MoodAnna, Kiran, MD;  [facility];  Service: Endoscopy;  Laterality: N/A;  ? CRYOTHERAPY  20 + years ago  ? cervix  ? DILITATION & CURRETTAGE/HYSTROSCOPY WITH NOVASURE ABLATION N/A 04/11/2015  ? Procedure: DILATATION & CURETTAGE/HYSTEROSCOPY WITH NOVASURE ABLATION;  Surgeon: Herold HarmsMartin A Defrancesco, MD;  Location: [facility] ORS;  Service: Gynecology;  Laterality: N/A;  ? ESOPHAGOGASTRODUODENOSCOPY (EGD) WITH PROPOFOL N/A 12/28/2016  ? Procedure: ESOPHAGOGASTRODUODENOSCOPY (EGD) WITH PROPOFOL;  Surgeon: Wyline MoodAnna, Kiran, MD;  [facility];  Service: Endoscopy;  Laterality: N/A;  ? ESSURE TUBAL LIGATION     ? lumpectomy Right   ? NASAL SEPTOPLASTY W/ TURBINOPLASTY Bilateral 10/07/2018  ? Procedure: SEPTOPLASTY WITH BILATERAL SUBMUCOUS RESECTION OF INFERIOR TURBINATES;  Surgeon: Linus SalmonsMcQueen, Chapman, MD;  Location: [facility] ORS;  Service: ENT;  Laterality: Bilateral;  ? TONSILLECTOMY    ? TUBAL LIGATION    ? ? ?Medications:  ?Current Outpatient Medications on File Prior to Visit  ?Medication Sig  ? meclizine (ANTIVERT) 25 MG tablet Take 1 tablet (25 mg total) by mouth 3 (three) times daily as  needed for dizziness.  ? ?No current facility-administered medications on file prior to visit.  ? ? ?Allergies:  ?Allergies  ?Allergen Reactions  ? Erythromycin Itching  ? ? ?Social History:  ?Social History  ? ?Socioeconomic History  ? Marital status: Significant Other  ?  Spouse name: Not on file  ? Number of children: Not on file  ? Years of education: Not on file  ? Highest education level: Not on file  ?Occupational History  ? Not on file  ?Tobacco Use  ? Smoking status: Former  ?  Types: Cigarettes  ? Smokeless tobacco: Never  ? Tobacco comments:  ?  occasional  ?Vaping Use  ? Vaping Use: Former  ?Substance and Sexual Activity  ? Alcohol use: Yes  ?  Alcohol/week: 10.0 standard drinks  ?  Types: 10 Shots of liquor per week  ? Drug use: Yes  ?  Types: Marijuana  ?  Comment: Occasional, hx of cocaine in past  ? Sexual activity: Yes  ?Other Topics Concern  ? Not on file  ?Social History Narrative  ? Not on file  ? ?Social Determinants of Health  ? ?Financial Resource Strain: Not on file  ?Food Insecurity: Not on file  ?Transportation Needs: Not on file  ?Physical Activity: Not on file  ?Stress: Not on file  ?Social Connections: Not on file  ?Intimate Partner Violence: Not on file  ? ?Social History  ? ?Tobacco Use  ?Smoking Status Former  ? Types: Cigarettes  ?Smokeless Tobacco Never  ?Tobacco Comments  ? occasional  ? ?Social History  ? ?Substance and Sexual Activity  ?Alcohol Use Yes  ? Alcohol/week: 10.0 standard drinks  ? Types:  10 Shots of liquor per week  ? ? ?Family History:  ?Family History  ?Problem Relation Age of Onset  ? Hypothyroidism Paternal Grandfather   ? Breast cancer Paternal Grandmother   ? Cancer Paternal Grandmother

## 2021-12-05 NOTE — Patient Instructions (Signed)
Please call to schedule your mammogram and/or bone density: ?[facility]  ?Address: [address], [place], [place] [address] ?Phone: [phone]  ?

## 2021-12-06 LAB — COMPREHENSIVE METABOLIC PANEL
ALT: 48 IU/L — ABNORMAL HIGH (ref 0–32)
AST: 29 IU/L (ref 0–40)
Albumin/Globulin Ratio: 1.8 (ref 1.2–2.2)
Albumin: 4.5 g/dL (ref 3.8–4.8)
Alkaline Phosphatase: 86 IU/L (ref 44–121)
BUN/Creatinine Ratio: 16 (ref 9–23)
BUN: 16 mg/dL (ref 6–24)
Bilirubin Total: 0.4 mg/dL (ref 0.0–1.2)
CO2: 25 mmol/L (ref 20–29)
Calcium: 9.4 mg/dL (ref 8.7–10.2)
Chloride: 103 mmol/L (ref 96–106)
Creatinine, Ser: 1.01 mg/dL — ABNORMAL HIGH (ref 0.57–1.00)
Globulin, Total: 2.5 g/dL (ref 1.5–4.5)
Glucose: 77 mg/dL (ref 70–99)
Potassium: 3.8 mmol/L (ref 3.5–5.2)
Sodium: 142 mmol/L (ref 134–144)
Total Protein: 7 g/dL (ref 6.0–8.5)
eGFR: 68 mL/min/{1.73_m2} (ref >59)

## 2021-12-06 LAB — URINALYSIS, ROUTINE W REFLEX MICROSCOPIC
Bilirubin, UA: NEGATIVE
Ketones, UA: NEGATIVE
Leukocytes,UA: NEGATIVE
Nitrite, UA: NEGATIVE
RBC, UA: NEGATIVE
Specific Gravity, UA: >1.03 — ABNORMAL HIGH (ref 1.005–1.030)
Urobilinogen, Ur: 0.2 mg/dL (ref 0.2–1.0)
pH, UA: 5.5 (ref 5.0–7.5)

## 2021-12-06 LAB — CBC WITH DIFFERENTIAL/PLATELET
Basophils Absolute: 0.1 10*3/uL (ref 0.0–0.2)
Basos: 1 %
EOS (ABSOLUTE): 0.2 10*3/uL (ref 0.0–0.4)
Eos: 2 %
Hematocrit: 40.8 % (ref 34.0–46.6)
Hemoglobin: 13.6 g/dL (ref 11.1–15.9)
Immature Grans (Abs): 0 10*3/uL (ref 0.0–0.1)
Immature Granulocytes: 0 %
Lymphocytes Absolute: 2.8 10*3/uL (ref 0.7–3.1)
Lymphs: 40 %
MCH: 30.3 pg (ref 26.6–33.0)
MCHC: 33.3 g/dL (ref 31.5–35.7)
MCV: 91 fL (ref 79–97)
Monocytes Absolute: 0.6 10*3/uL (ref 0.1–0.9)
Monocytes: 9 %
Neutrophils Absolute: 3.4 10*3/uL (ref 1.4–7.0)
Neutrophils: 48 %
Platelets: 304 10*3/uL (ref 150–450)
RBC: 4.49 x10E6/uL (ref 3.77–5.28)
RDW: 12.9 % (ref 11.7–15.4)
WBC: 7.1 10*3/uL (ref 3.4–10.8)

## 2021-12-06 LAB — BAYER DCA HB A1C WAIVED: HB A1C (BAYER DCA - WAIVED): 5.2 % (ref 4.8–5.6)

## 2021-12-06 LAB — LIPID PANEL W/O CHOL/HDL RATIO
Cholesterol, Total: 202 mg/dL — ABNORMAL HIGH (ref 100–199)
HDL: 56 mg/dL (ref >39)
LDL Chol Calc (NIH): 119 mg/dL — ABNORMAL HIGH (ref 0–99)
Triglycerides: 155 mg/dL — ABNORMAL HIGH (ref 0–149)
VLDL Cholesterol Cal: 27 mg/dL (ref 5–40)

## 2021-12-06 LAB — MICROALBUMIN, URINE WAIVED
Creatinine, Urine Waived: 300 mg/dL (ref 10–300)
Microalb, Ur Waived: 30 mg/L — ABNORMAL HIGH (ref 0–19)
Microalb/Creat Ratio: <30 mg/g (ref <30)

## 2021-12-06 LAB — TSH: TSH: 4.1 u[IU]/mL (ref 0.450–4.500)

## 2021-12-07 ENCOUNTER — Other Ambulatory Visit: Payer: Self-pay | Admitting: Family Medicine

## 2021-12-07 MED ORDER — LEVOTHYROXINE SODIUM 112 MCG PO TABS: 112.0000 ug | ORAL_TABLET | Freq: Every day | ORAL | 3 refills | Status: DC

## 2021-12-07 MED ORDER — AMPHETAMINE-DEXTROAMPHET ER 10 MG PO CP24: 10.0000 mg | ORAL_CAPSULE | Freq: Every day | ORAL | 0 refills | Status: DC

## 2021-12-07 MED ORDER — AMPHETAMINE-DEXTROAMPHET ER 30 MG PO CP24
30.0000 mg | ORAL_CAPSULE | ORAL | 0 refills | Status: DC
Start: 2022-01-19 — End: 2022-03-12

## 2021-12-07 MED ORDER — AMPHETAMINE-DEXTROAMPHET ER 30 MG PO CP24: 30.0000 mg | ORAL_CAPSULE | ORAL | 0 refills | Status: DC

## 2021-12-07 MED ORDER — AMPHETAMINE-DEXTROAMPHETAMINE 5 MG PO TABS: 5.0000 mg | ORAL_TABLET | Freq: Every day | ORAL | 0 refills | Status: DC

## 2021-12-07 MED ORDER — AMPHETAMINE-DEXTROAMPHETAMINE 5 MG PO TABS
5.0000 mg | ORAL_TABLET | Freq: Every day | ORAL | 0 refills | Status: DC
Start: 2022-02-18 — End: 2022-03-12

## 2021-12-07 NOTE — Assessment & Plan Note (Signed)
Under good control on current regimen. Continue current regimen. Continue to monitor. Call with any concerns. Refills given for 3 months. Follow up 3 months.    

## 2021-12-07 NOTE — Assessment & Plan Note (Signed)
Labs drawn today. Await results. Treat as needed.  

## 2021-12-07 NOTE — Assessment & Plan Note (Signed)
Under good control on current regimen. Continue current regimen. Continue to monitor. Call with any concerns. Refills given. Labs drawn today.   

## 2021-12-07 NOTE — Assessment & Plan Note (Signed)
A1c checked today. Await results. ?

## 2021-12-11 DIAGNOSIS — R7309 Other abnormal glucose: Secondary | ICD-10-CM | POA: Diagnosis not present

## 2021-12-15 ENCOUNTER — Encounter: Payer: BC Managed Care – PPO | Admitting: Family Medicine

## 2022-01-11 DIAGNOSIS — R7309 Other abnormal glucose: Secondary | ICD-10-CM | POA: Diagnosis not present

## 2022-02-09 ENCOUNTER — Ambulatory Visit: Payer: Self-pay

## 2022-02-09 DIAGNOSIS — F17210 Nicotine dependence, cigarettes, uncomplicated: Secondary | ICD-10-CM | POA: Diagnosis not present

## 2022-02-09 DIAGNOSIS — Y9289 Other specified places as the place of occurrence of the external cause: Secondary | ICD-10-CM | POA: Diagnosis not present

## 2022-02-09 DIAGNOSIS — R110 Nausea: Secondary | ICD-10-CM | POA: Diagnosis not present

## 2022-02-09 DIAGNOSIS — G44309 Post-traumatic headache, unspecified, not intractable: Secondary | ICD-10-CM | POA: Diagnosis not present

## 2022-02-09 DIAGNOSIS — W01198A Fall on same level from slipping, tripping and stumbling with subsequent striking against other object, initial encounter: Secondary | ICD-10-CM | POA: Diagnosis not present

## 2022-02-09 DIAGNOSIS — S0990XA Unspecified injury of head, initial encounter: Secondary | ICD-10-CM | POA: Diagnosis not present

## 2022-02-09 NOTE — Telephone Encounter (Signed)
  Chief Complaint: head injury Symptoms: fall, hit back of head at base and R shoulder, HA, nausea, dizziness intermittent Frequency: fall occurred on 01/30/22 Pertinent Negatives: NA Disposition: [x] ED /[] Urgent Care (no appt availability in office) / [] Appointment(In office/virtual)/ []  [facility]/ [] Home Care/ [] Refused Recommended Disposition /[] [facility]/ []  Follow-up with PCP Additional Notes: pt states she fell over a cinderblock and hit her posterior head on the rail of their food truck pretty hard. This happened on 01/30/22. She says that she has had HA and dizziness since then but not that bad but HA has started making her feel nauseous. I advised her that she would need to go to ED and prior to sending her spoke with Val, CMA to make sure advice was correct and pt wouldn't be able to be seen in office. Val, CMA spoke with pt.   Reason for Disposition  Sounds like a serious injury to the triager  Answer Assessment - Initial Assessment Questions 1. MECHANISM: "How did the fall happen?"     Tripped over concrete block, hit head on rail on back of food truck 3. ONSET: "When did the fall happen?" (e.g., minutes, hours, or days ago)     Fall happened on 01/30/22 4. LOCATION: "What part of the body hit the ground?" (e.g., back, buttocks, head, hips, knees, hands, head, stomach)     Hit base of head 5. INJURY: "Did you hurt (injure) yourself when you fell?" If Yes, ask: "What did you injure? Tell me more about this?" (e.g., body area; type of injury; pain severity)"     R shoulder and base of head  6. PAIN: "Is there any pain?" If Yes, ask: "How bad is the pain?" (e.g., Scale 1-10; or mild,  moderate, severe)   - NONE (0): No pain   - MILD (1-3): Doesn't interfere with normal activities    - MODERATE (4-7): Interferes with normal activities or awakens from sleep    - SEVERE (8-10): Excruciating pain, unable to do any normal activities      3 9. OTHER SYMPTOMS: "Do  you have any other symptoms?" (e.g., dizziness, fever, weakness; new onset or worsening).      HA and nausea  Protocols used: Falls and Falling-A-AH, Head Injury-A-AH

## 2022-02-09 NOTE — Telephone Encounter (Signed)
Pt has been advised to ED due to symptoms s/p fall.

## 2022-02-10 DIAGNOSIS — R7309 Other abnormal glucose: Secondary | ICD-10-CM | POA: Diagnosis not present

## 2022-02-12 ENCOUNTER — Telehealth: Payer: Self-pay | Admitting: *Deleted

## 2022-02-12 NOTE — Telephone Encounter (Signed)
Transition Care Management Follow-up Telephone  Date of discharge and from where: [facility] 6-30 How have you been since you were released from the hospital? Still having a headache nausea  Any questions or concerns? No  Items Reviewed: Did the pt receive and understand the discharge instructions provided? Yes  Medications obtained and verified? No  Other? No  Any new allergies since your discharge? Yes  Dietary orders reviewed? No Do you have support at home? Yes   Home Care and Equipment/Supplies: Were home health services ordered?  If so, what is the name of the agency?   Has the agency set up a time to come to the patient's home?  Were any new equipment or medical supplies ordered?   What is the name of the medical supply agency?  Were you able to get the supplies/equipment?  Do you have any questions related to the use of the equipment or supplies?   Functional Questionnaire: (I = Independent and D = Dependent) ADLs: i  Bathing/Dressing- i  Meal Prep- i  Eating- i  Maintaining continence- i  Transferring/Ambulation- i  Managing Meds- i  Follow up appointments reviewed:  PCP Hospital f/u appt confirmed? Yes  Scheduled to see 02-14-2022 [place] @ 9:00 on  @ [facility] no Are transportation arrangements needed? No  If their condition worsens, is the pt aware to call PCP or go to the Emergency Dept.? Yes Was the patient provided with contact information for the PCP's office or ED? Yes Was to pt encouraged to call back with questions or concerns? Yes

## 2022-02-14 ENCOUNTER — Encounter: Payer: Self-pay | Admitting: Physician Assistant

## 2022-02-14 ENCOUNTER — Ambulatory Visit (INDEPENDENT_AMBULATORY_CARE_PROVIDER_SITE_OTHER): Payer: BC Managed Care – PPO | Admitting: Physician Assistant

## 2022-02-14 VITALS — BP 116/76 | HR 75 | Temp 97.9°F | Wt 186.8 lb

## 2022-02-14 DIAGNOSIS — S060XAD Concussion with loss of consciousness status unknown, subsequent encounter: Secondary | ICD-10-CM

## 2022-02-14 DIAGNOSIS — S060XAA Concussion with loss of consciousness status unknown, initial encounter: Secondary | ICD-10-CM | POA: Insufficient documentation

## 2022-02-14 DIAGNOSIS — F0781 Postconcussional syndrome: Secondary | ICD-10-CM | POA: Insufficient documentation

## 2022-02-14 MED ORDER — TIZANIDINE HCL 2 MG PO TABS
2.0000 mg | ORAL_TABLET | Freq: Four times a day (QID) | ORAL | 0 refills | Status: DC | PRN
Start: 2022-02-14 — End: 2022-10-12

## 2022-02-14 NOTE — Patient Instructions (Addendum)
Concussions can take some time to resolve While you are recovering I recommend the folllowing  Rest as much as possible Reduce your stimulation from outside sources as much as possible- reduced screen time, reduced social interaction, decrease chores and other tasks that require concentration You can gradually increase your activity and cognitive tasks as you are feeling more capable of completing them.  I am sending in a script for a muscle relaxer to help with the neck pain - this can make you drowsy so take at night first to see how it affects you I have sent in a referral to Neurology, the referral team should contact you soon to set this up.   I do not recommend driving until you are feeling less dizzy and able to concentrate better.

## 2022-02-14 NOTE — Assessment & Plan Note (Addendum)
Acute, ongoing problem Reports she fell and hit head on 01/30/22 and was seen in ED on 02/09/22- head CT was normal She thinks she may have lost consciousness but is not sure for how long  Ongoing concerns for headaches, photophobia, phonophobia, cognitive fatigue, memory and concentration issues Has tried Tylenol, Meloxicam for headaches Will add Tizanidine for MSK concerns in neck and to assist with sleep Reviewed cognitive rest, rest, sleep recommendations to assist with recovery Will place neurology referral to assist with eval and monitoring Discussed with patient that we are amenable to assisting with work accommodations/ recommendations but she will need to speak with employer for clarification first.  May need to add Nortriptyline or Amitriptyline if headaches persist  Follow up in 6 weeks to monitor recovery.

## 2022-02-14 NOTE — Progress Notes (Signed)
Established Patient Office Visit  Name: Pamela Avila   MRN: [id number]    DOB: 25-Jul-1971   Date:02/14/2022  Today's Provider: Talitha Givens, MHS, PA-C Introduced myself to the patient as a PA-C and provided education on APPs in clinical practice.         Subjective  Chief Complaint  Chief Complaint  Patient presents with   ER Follow Up    States she had a fall 01/30/22. States she was feeling dizzy, not sleeping well, sore and achy from the fall. States she was also having some headaches and was feeling worse rather than better after the fall. States she went to the ER at [facility] 02/09/22 due to worsening symptoms and not feeling better.     HPI  Appears to have fallen and hit head on the 20th - was still having nausea and headaches on 30th and was instructed to go to ED CT scan clear there Given Zofran for nausea in ED   Reports she feels better than she did when she went to ED States she is still having headaches and notes increased dizziness with rapid head movement Reports headaches are exacerbated with sound and she is having trouble sleeping States she is having some trouble with cognition and memory. States this has increased her anxiety a bit Reports she is more emotional than she usually is- states she had a small family get together yesterday and she cried several times She states she has tried taking Tylenol for pain. 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    Patient Active Problem List   Diagnosis Date Noted   Concussion with unknown loss of consciousness status 02/14/2022   Obesity (BMI 30.0-34.9)  06/29/2019   Obstructive sleep apnea treated with continuous positive airway pressure (CPAP) 10/16/2018   Excessive daytime sleepiness 07/26/2018   Inadequate sleep hygiene 07/26/2018   Dysthymia 07/26/2018   Circadian rhythm sleep disorder, shift work type 07/26/2018   Status post endometrial ablation 01/09/2018   Right atrial enlargement 05/26/2015   Family history of diabetes mellitus    ADHD (attention deficit hyperactivity disorder) 02/01/2015   Genital herpes 02/01/2015   Irritable bowel syndrome 02/01/2015   Hypothyroidism 02/01/2015   Anxiety and depression 02/01/2015   Essential hypertension, benign 02/01/2015   GERD (gastroesophageal reflux disease) 02/01/2015   Tobacco user 02/01/2015    Past Surgical History:  Procedure Laterality Date   BREAST EXCISIONAL BIOPSY Right 2010   neg surgical breast bx     BREAST SURGERY Right    lumpectomy   CESAREAN SECTION     x 2   COLONOSCOPY WITH PROPOFOL N/A 12/28/2016   Procedure: COLONOSCOPY WITH PROPOFOL;  Surgeon: Jonathon Bellows, MD;  Location: [facility] ENDOSCOPY;  Service: Endoscopy;  Laterality: N/A;   CRYOTHERAPY  20 + years ago   cervix   [place] N/A 04/11/2015   Procedure: DILATATION & CURETTAGE/HYSTEROSCOPY WITH NOVASURE ABLATION;  Surgeon: Brayton Mars, MD;  Location: [facility] ORS;  Service: Gynecology;  Laterality: N/A;   ESOPHAGOGASTRODUODENOSCOPY (EGD) WITH PROPOFOL N/A 12/28/2016  Procedure: ESOPHAGOGASTRODUODENOSCOPY (EGD) WITH PROPOFOL;  Surgeon: Jonathon Bellows, MD;  Location: [facility] ENDOSCOPY;  Service: Endoscopy;  Laterality: N/A;   ESSURE TUBAL LIGATION     lumpectomy Right    NASAL SEPTOPLASTY W/ TURBINOPLASTY Bilateral 10/07/2018   Procedure: SEPTOPLASTY WITH BILATERAL SUBMUCOUS RESECTION OF INFERIOR TURBINATES;  Surgeon: Beverly Gust, MD;  Location: [facility] ORS;  Service: ENT;  Laterality: Bilateral;   TONSILLECTOMY     TUBAL LIGATION      Family History  Problem  Relation Age of Onset   Hypothyroidism Paternal Grandfather    Breast cancer Paternal Grandmother    Cancer Paternal Grandmother        breast   Breast cancer Maternal Grandmother    Hyperlipidemia Maternal Grandfather    Diabetes Father    Obesity Father    Hypertension Father    Hyperlipidemia Father    COPD Father    Ovarian cancer Paternal Aunt        ?   Cancer Paternal Aunt        breast and cervical   Breast cancer Paternal Aunt    Hypothyroidism Mother    Breast cancer Son    Cancer Son        breast   Colon cancer Neg Hx     Social History   Tobacco Use   Smoking status: Former    Types: Cigarettes   Smokeless tobacco: Never   Tobacco comments:    occasional  Substance Use Topics   Alcohol use: Yes    Alcohol/week: 12.0 standard drinks of alcohol    Types: 12 Standard drinks or equivalent per week     Current Outpatient Medications:    acetaminophen (TYLENOL) 500 MG tablet, Take 500 mg by mouth every 6 (six) hours as needed., Disp: , Rfl:    albuterol (VENTOLIN HFA) 108 (90 Base) MCG/ACT inhaler, Inhale 2 puffs into the lungs every 6 (six) hours as needed for wheezing or shortness of breath., Disp: 18 g, Rfl: 6   [START ON 02/18/2022] amphetamine-dextroamphetamine (ADDERALL XR) 10 MG 24 hr capsule, Take 1 capsule (10 mg total) by mouth daily. To be taken with 58m XR for 447mtotal, Disp: 30 capsule, Rfl: 0   amphetamine-dextroamphetamine (ADDERALL XR) 10 MG 24 hr capsule, Take 1 capsule (10 mg total) by mouth daily. To be taken with 3038mR for 73m54mtal, Disp: 30 capsule, Rfl: 0   [START ON 02/18/2022] amphetamine-dextroamphetamine (ADDERALL XR) 30 MG 24 hr capsule, Take 1 capsule (30 mg total) by mouth every morning. To be taken with 10mg25mfor 73mg 43ml, Disp: 30 capsule, Rfl: 0   amphetamine-dextroamphetamine (ADDERALL XR) 30 MG 24 hr capsule, Take 1 capsule (30 mg total) by mouth every morning. To be taken with 10mg X11mr 73mg to5m Disp: 30 capsule, Rfl:  0   [START ON 02/18/2022] amphetamine-dextroamphetamine (ADDERALL) 5 MG tablet, Take 1 tablet (5 mg total) by mouth daily., Disp: 30 tablet, Rfl: 0   amphetamine-dextroamphetamine (ADDERALL) 5 MG tablet, Take 1 tablet (5 mg total) by mouth daily., Disp: 30 tablet, Rfl: 0   buPROPion (WELLBUTRIN XL) 150 MG 24 hr tablet, TAKE 1 TABLET BY MOUTH EVERY DAY GENERIC EQUIVALENT FOR WELLBUTRIN XL, Disp: 90 tablet, Rfl: 1   busPIRone (BUSPAR) 5 MG tablet, Take 1-2 tablets (5-10 mg total) by mouth 3 (three) times daily as needed (anxiety)., Disp: 540 tablet, Rfl: 1   dicyclomine (BENTYL) 10 MG capsule, Take 1 capsule (10 mg total) by mouth 3 (three)  times daily as needed for spasms., Disp: 270 capsule, Rfl: 1   escitalopram (LEXAPRO) 20 MG tablet, Take 1 tablet (20 mg total) by mouth daily., Disp: 90 tablet, Rfl: 1   levothyroxine (SYNTHROID) 112 MCG tablet, Take 1 tablet (112 mcg total) by mouth daily., Disp: 90 tablet, Rfl: 3   lisinopril (ZESTRIL) 10 MG tablet, Take 1 tablet (10 mg total) by mouth daily., Disp: 90 tablet, Rfl: 1   meclizine (ANTIVERT) 25 MG tablet, Take 1 tablet (25 mg total) by mouth 3 (three) times daily as needed for dizziness., Disp: 30 tablet, Rfl: 0   meloxicam (MOBIC) 15 MG tablet, Take 1 tablet (15 mg total) by mouth daily., Disp: 90 tablet, Rfl: 1   metoprolol succinate (TOPROL-XL) 25 MG 24 hr tablet, TAKE ONE AND ONE-HALF TABLETS( 37.5 MG TOTAL) BY MOUTH DAILY GENERIC EQUIVALENT FOR TOPROL XL, Disp: 135 tablet, Rfl: 1   omeprazole (PRILOSEC) 20 MG capsule, TAKE ONE CAPSULE BY MOUTH TWICE DAILY AS NEEDED FOR HEARTBURN/ INDIGESTION., Disp: 180 capsule, Rfl: 1   ondansetron (ZOFRAN-ODT) 4 MG disintegrating tablet, Take 4 mg by mouth every 8 (eight) hours as needed., Disp: , Rfl:    tiZANidine (ZANAFLEX) 2 MG tablet, Take 1 tablet (2 mg total) by mouth every 6 (six) hours as needed for muscle spasms., Disp: 30 tablet, Rfl: 0   amphetamine-dextroamphetamine (ADDERALL XR) 10 MG 24 hr  capsule, Take 1 capsule (10 mg total) by mouth daily. To be taken with 59m XR for 432mtotal, Disp: 30 capsule, Rfl: 0   amphetamine-dextroamphetamine (ADDERALL XR) 30 MG 24 hr capsule, Take 1 capsule (30 mg total) by mouth every morning. To be taken with 1062mR for 30m62mtal, Disp: 30 capsule, Rfl: 0   amphetamine-dextroamphetamine (ADDERALL) 5 MG tablet, Take 1 tablet (5 mg total) by mouth daily. PRN on longer days when you need to focus, Disp: 30 tablet, Rfl: 0  Allergies  Allergen Reactions   Erythromycin Itching    I personally reviewed active problem list, medication list, allergies, notes from last encounter, lab results, imaging with the patient/caregiver today.   Review of Systems  Eyes:  Positive for photophobia.  Gastrointestinal:  Positive for nausea. Negative for vomiting.  Musculoskeletal:  Positive for neck pain.  Neurological:  Positive for dizziness and headaches. Negative for speech change.      Objective  Vitals:   02/14/22 0916  BP: 116/76  Pulse: 75  Temp: 97.9 F (36.6 C)  TempSrc: Oral  SpO2: 98%  Weight: 186 lb 12.8 oz (84.7 kg)    Body mass index is 36.18 kg/m.  Physical Exam Vitals reviewed.  Constitutional:      General: She is awake.     Appearance: Normal appearance. She is well-developed and well-groomed. She is obese.  HENT:     Head: Normocephalic and atraumatic. No raccoon eyes.  Eyes:     General: Lids are normal. Vision grossly intact.     Extraocular Movements: Extraocular movements intact.     Right eye: Normal extraocular motion and no nystagmus.     Left eye: Normal extraocular motion and no nystagmus.     Conjunctiva/sclera: Conjunctivae normal.     Pupils: Pupils are equal, round, and reactive to light.  Pulmonary:     Effort: Pulmonary effort is normal.  Musculoskeletal:     Cervical back: Normal range of motion and neck supple.  Neurological:     Mental Status: She is alert.  Psychiatric:  Attention and  Perception: Attention and perception normal.        Mood and Affect: Mood and affect normal.        Speech: Speech normal.        Behavior: Behavior normal. Behavior is cooperative.        Cognition and Memory: Cognition normal.      Recent Results (from the past 2160 hour(s))  Urinalysis, Routine w reflex microscopic     Status: Abnormal   Collection Time: 12/05/21  4:21 PM  Result Value Ref Range   Specific Gravity, UA >1.030 (H) 1.005 - 1.030   pH, UA 5.5 5.0 - 7.5   Color, UA Yellow Yellow   Appearance Ur Clear Clear   Leukocytes,UA Negative Negative   Protein,UA Trace (A) Negative/Trace   Glucose, UA 1+ (A) Negative   Ketones, UA Negative Negative   RBC, UA Negative Negative   Bilirubin, UA Negative Negative   Urobilinogen, Ur 0.2 0.2 - 1.0 mg/dL   Nitrite, UA Negative Negative  Microalbumin, Urine Waived     Status: Abnormal   Collection Time: 12/05/21  4:21 PM  Result Value Ref Range   Microalb, Ur Waived 30 (H) 0 - 19 mg/L   Creatinine, Urine Waived 300 10 - 300 mg/dL   Microalb/Creat Ratio <30 <30 mg/g    Comment:                              Abnormal:       30 - 300                         High Abnormal:           >300   Bayer DCA Hb A1c Waived     Status: None   Collection Time: 12/05/21  4:21 PM  Result Value Ref Range   HB A1C (BAYER DCA - WAIVED) 5.2 4.8 - 5.6 %    Comment:          Prediabetes: 5.7 - 6.4          Diabetes: >6.4          Glycemic control for adults with diabetes: <7.0   CBC with Differential/Platelet     Status: None   Collection Time: 12/05/21  4:23 PM  Result Value Ref Range   WBC 7.1 3.4 - 10.8 x10E3/uL   RBC 4.49 3.77 - 5.28 x10E6/uL   Hemoglobin 13.6 11.1 - 15.9 g/dL   Hematocrit 40.8 34.0 - 46.6 %   MCV 91 79 - 97 fL   MCH 30.3 26.6 - 33.0 pg   MCHC 33.3 31.5 - 35.7 g/dL   RDW 12.9 11.7 - 15.4 %   Platelets 304 150 - 450 x10E3/uL   Neutrophils 48 Not Estab. %   Lymphs 40 Not Estab. %   Monocytes 9 Not Estab. %   Eos 2 Not  Estab. %   Basos 1 Not Estab. %   Neutrophils Absolute 3.4 1.4 - 7.0 x10E3/uL   Lymphocytes Absolute 2.8 0.7 - 3.1 x10E3/uL   Monocytes Absolute 0.6 0.1 - 0.9 x10E3/uL   EOS (ABSOLUTE) 0.2 0.0 - 0.4 x10E3/uL   Basophils Absolute 0.1 0.0 - 0.2 x10E3/uL   Immature Granulocytes 0 Not Estab. %   Immature Grans (Abs) 0.0 0.0 - 0.1 x10E3/uL  Comprehensive metabolic panel     Status: Abnormal   Collection Time: 12/05/21  4:23 PM  Result Value Ref Range   Glucose 77 70 - 99 mg/dL   BUN 16 6 - 24 mg/dL   Creatinine, Ser 1.01 (H) 0.57 - 1.00 mg/dL   eGFR 68 >59 mL/min/1.73   BUN/Creatinine Ratio 16 9 - 23   Sodium 142 134 - 144 mmol/L   Potassium 3.8 3.5 - 5.2 mmol/L   Chloride 103 96 - 106 mmol/L   CO2 25 20 - 29 mmol/L   Calcium 9.4 8.7 - 10.2 mg/dL   Total Protein 7.0 6.0 - 8.5 g/dL   Albumin 4.5 3.8 - 4.8 g/dL   Globulin, Total 2.5 1.5 - 4.5 g/dL   Albumin/Globulin Ratio 1.8 1.2 - 2.2   Bilirubin Total 0.4 0.0 - 1.2 mg/dL   Alkaline Phosphatase 86 44 - 121 IU/L   AST 29 0 - 40 IU/L   ALT 48 (H) 0 - 32 IU/L  Lipid Panel w/o Chol/HDL Ratio     Status: Abnormal   Collection Time: 12/05/21  4:23 PM  Result Value Ref Range   Cholesterol, Total 202 (H) 100 - 199 mg/dL   Triglycerides 155 (H) 0 - 149 mg/dL   HDL 56 >39 mg/dL   VLDL Cholesterol Cal 27 5 - 40 mg/dL   LDL Chol Calc (NIH) 119 (H) 0 - 99 mg/dL  TSH     Status: None   Collection Time: 12/05/21  4:23 PM  Result Value Ref Range   TSH 4.100 0.450 - 4.500 uIU/mL     PHQ2/9:    02/14/2022    9:27 AM 12/05/2021    4:19 PM 09/21/2021    3:59 PM 05/26/2021   11:16 AM 12/29/2020    1:51 PM  Depression screen PHQ 2/9  Decreased Interest 2 1 0 0 0  Down, Depressed, Hopeless 0 1 0 0 0  PHQ - 2 Score 2 2 0 0 0  Altered sleeping '3 1 1 2 2  ' Tired, decreased energy '3 1 1 1 1  ' Change in appetite 3 1 0 0 0  Feeling bad or failure about yourself  0 1 0 0 0  Trouble concentrating 3 0 '3 2 1  ' Moving slowly or fidgety/restless 3 0 1 0  1  Suicidal thoughts 0 0 0 0 0  PHQ-9 Score '17 6 6 5 5  ' Difficult doing work/chores Somewhat difficult  Not difficult at all  Somewhat difficult      Fall Risk:    02/14/2022    9:27 AM 12/05/2021    4:19 PM 09/21/2021    3:59 PM 09/07/2020    4:02 PM 12/21/2019    2:18 PM  [place] in the past year? 0 0 0 0 0  Number falls in past yr: 0 0 0  0  Injury with Fall? 0 0 0  0  Risk for fall due to : No Fall Risks No Fall Risks No Fall Risks    Follow up Falls evaluation completed Falls evaluation completed Falls evaluation completed        Functional Status Survey:      Assessment & Plan  Problem List Items Addressed This Visit       Nervous and Auditory   Concussion with unknown loss of consciousness status - Primary    Acute, ongoing problem Reports she fell and hit head on 01/30/22 and was seen in ED on 02/09/22- head CT was normal She thinks she may have lost consciousness but is not sure  for how long  Ongoing concerns for headaches, photophobia, phonophobia, cognitive fatigue, memory and concentration issues Has tried Tylenol, Meloxicam for headaches Will add Tizanidine for MSK concerns in neck and to assist with sleep Reviewed cognitive rest, rest, sleep recommendations to assist with recovery Will place neurology referral to assist with eval and monitoring Discussed with patient that we are amenable to assisting with work accommodations/ recommendations but she will need to speak with employer for clarification first.  May need to add Nortriptyline or Amitriptyline if headaches persist  Follow up in 6 weeks to monitor recovery.       Relevant Medications   tiZANidine (ZANAFLEX) 2 MG tablet   Other Relevant Orders   Ambulatory referral to Neurology     Return in about 6 weeks (around 03/28/2022) for concussion follow up .   I, Corrin Hingle E Adelynne Joerger, PA-C, have reviewed all documentation for this visit. The documentation on 02/14/22 for the exam, diagnosis,  procedures, and orders are all accurate and complete.   Talitha Givens, MHS, PA-C [facility]

## 2022-03-01 DIAGNOSIS — H53149 Visual discomfort, unspecified: Secondary | ICD-10-CM | POA: Diagnosis not present

## 2022-03-01 DIAGNOSIS — R519 Headache, unspecified: Secondary | ICD-10-CM | POA: Diagnosis not present

## 2022-03-01 DIAGNOSIS — H538 Other visual disturbances: Secondary | ICD-10-CM | POA: Diagnosis not present

## 2022-03-01 DIAGNOSIS — F0781 Postconcussional syndrome: Secondary | ICD-10-CM | POA: Diagnosis not present

## 2022-03-12 ENCOUNTER — Ambulatory Visit (INDEPENDENT_AMBULATORY_CARE_PROVIDER_SITE_OTHER): Payer: BC Managed Care – PPO | Admitting: Family Medicine

## 2022-03-12 ENCOUNTER — Encounter: Payer: Self-pay | Admitting: Family Medicine

## 2022-03-12 VITALS — BP 102/68 | HR 82 | Temp 98.2°F | Wt 189.2 lb

## 2022-03-12 DIAGNOSIS — L03032 Cellulitis of left toe: Secondary | ICD-10-CM

## 2022-03-12 DIAGNOSIS — F901 Attention-deficit hyperactivity disorder, predominantly hyperactive type: Secondary | ICD-10-CM | POA: Diagnosis not present

## 2022-03-12 MED ORDER — AMPHETAMINE-DEXTROAMPHETAMINE 5 MG PO TABS: 5.0000 mg | ORAL_TABLET | Freq: Every day | ORAL | 0 refills | Status: DC

## 2022-03-12 MED ORDER — AMPHETAMINE-DEXTROAMPHET ER 10 MG PO CP24: 10.0000 mg | ORAL_CAPSULE | Freq: Every day | ORAL | 0 refills | Status: DC

## 2022-03-12 MED ORDER — SULFAMETHOXAZOLE-TRIMETHOPRIM 800-160 MG PO TABS
1.0000 | ORAL_TABLET | Freq: Two times a day (BID) | ORAL | 0 refills | Status: DC
Start: 2022-03-12 — End: 2022-05-31

## 2022-03-12 MED ORDER — AMPHETAMINE-DEXTROAMPHET ER 30 MG PO CP24: 30.0000 mg | ORAL_CAPSULE | ORAL | 0 refills | Status: DC

## 2022-03-12 NOTE — Progress Notes (Signed)
BP 102/68   Pulse 82   Temp 98.2 F (36.8 C)   Wt 189 lb 3.2 oz (85.8 kg)   SpO2 98%   BMI 36.64 kg/m    Subjective:    Patient ID: Pamela Avila, female    DOB: 25-Jul-1971, 51 y.o.   MRN: [id number]  HPI: Pamela Avila is a 51 y.o. female  Chief Complaint  Patient presents with   ADHD   Toe Injury    Patient states she hurt her right big toe over a week ago, toe is swollen and painful    ADHD FOLLOW UP ADHD status: controlled Satisfied with current therapy: yes Medication compliance:  excellent compliance Controlled substance contract: yes Previous psychiatry evaluation: no Previous medications: yes    Taking meds on weekends/vacations: yes Work/school performance:  fair Difficulty sustaining attention/completing tasks: no Distracted by extraneous stimuli: no Does not listen when spoken to: no  Fidgets with hands or feet: no Unable to stay in seat: no Blurts out/interrupts others: no ADHD Medication Side Effects: no    Decreased appetite: no    Headache: yes    Sleeping disturbance pattern: no    Irritability: no    Rebound effects (worse than baseline) off medication: no    Anxiousness: no    Dizziness: no    Tics: no  SKIN INFECTION Duration: over a week Location: L great toe History of trauma in area: yes Pain: yes Quality: aching and hot Severity: moderate Redness: yes Swelling: yes Oozing: yes Pus: yes Fevers: no Nausea/vomiting: no Status: stable Treatments attempted:tired to pull the nail out with a crochet hook  Tetanus: UTD  Relevant past medical, surgical, family and social history reviewed and updated as indicated. Interim medical history since our last visit reviewed. Allergies and medications reviewed and updated.  Review of Systems  Constitutional: Negative.   Respiratory: Negative.    Cardiovascular: Negative.   Musculoskeletal: Negative.   Skin: Negative.   Psychiatric/Behavioral: Negative.      Per HPI unless specifically  indicated above     Objective:    BP 102/68   Pulse 82   Temp 98.2 F (36.8 C)   Wt 189 lb 3.2 oz (85.8 kg)   SpO2 98%   BMI 36.64 kg/m   Wt Readings from Last 3 Encounters:  03/12/22 189 lb 3.2 oz (85.8 kg)  02/14/22 186 lb 12.8 oz (84.7 kg)  12/05/21 187 lb 6.4 oz (85 kg)    Physical Exam Vitals and nursing note reviewed.  Constitutional:      General: She is not in acute distress.    Appearance: Normal appearance. She is obese. She is not ill-appearing, toxic-appearing or diaphoretic.  HENT:     Head: Normocephalic and atraumatic.     Right Ear: External ear normal.     Left Ear: External ear normal.     Nose: Nose normal.     Mouth/Throat:     Mouth: Mucous membranes are moist.     Pharynx: Oropharynx is clear.  Eyes:     General: No scleral icterus.       Right eye: No discharge.        Left eye: No discharge.     Extraocular Movements: Extraocular movements intact.     Conjunctiva/sclera: Conjunctivae normal.     Pupils: Pupils are equal, round, and reactive to light.  Cardiovascular:     Rate and Rhythm: Normal rate and regular rhythm.     Pulses: Normal pulses.  Heart sounds: Normal heart sounds. No murmur heard.    No friction rub. No gallop.  Pulmonary:     Effort: Pulmonary effort is normal. No respiratory distress.     Breath sounds: Normal breath sounds. No stridor. No wheezing, rhonchi or rales.  Chest:     Chest wall: No tenderness.  Musculoskeletal:        General: Normal range of motion.     Cervical back: Normal range of motion and neck supple.  Skin:    General: Skin is warm and dry.     Capillary Refill: Capillary refill takes less than 2 seconds.     Coloration: Skin is not jaundiced or pale.     Findings: No bruising, erythema, lesion or rash.     Comments: L great toe paronychia  Neurological:     General: No focal deficit present.     Mental Status: She is alert and oriented to person, place, and time. Mental status is at baseline.   Psychiatric:        Mood and Affect: Mood normal.        Behavior: Behavior normal.        Thought Content: Thought content normal.        Judgment: Judgment normal.     Results for orders placed or performed in visit on 12/05/21  CBC with Differential/Platelet  Result Value Ref Range   WBC 7.1 3.4 - 10.8 x10E3/uL   RBC 4.49 3.77 - 5.28 x10E6/uL   Hemoglobin 13.6 11.1 - 15.9 g/dL   Hematocrit 40.8 34.0 - 46.6 %   MCV 91 79 - 97 fL   MCH 30.3 26.6 - 33.0 pg   MCHC 33.3 31.5 - 35.7 g/dL   RDW 12.9 11.7 - 15.4 %   Platelets 304 150 - 450 x10E3/uL   Neutrophils 48 Not Estab. %   Lymphs 40 Not Estab. %   Monocytes 9 Not Estab. %   Eos 2 Not Estab. %   Basos 1 Not Estab. %   Neutrophils Absolute 3.4 1.4 - 7.0 x10E3/uL   Lymphocytes Absolute 2.8 0.7 - 3.1 x10E3/uL   Monocytes Absolute 0.6 0.1 - 0.9 x10E3/uL   EOS (ABSOLUTE) 0.2 0.0 - 0.4 x10E3/uL   Basophils Absolute 0.1 0.0 - 0.2 x10E3/uL   Immature Granulocytes 0 Not Estab. %   Immature Grans (Abs) 0.0 0.0 - 0.1 x10E3/uL  Comprehensive metabolic panel  Result Value Ref Range   Glucose 77 70 - 99 mg/dL   BUN 16 6 - 24 mg/dL   Creatinine, Ser 1.01 (H) 0.57 - 1.00 mg/dL   eGFR 68 >59 mL/min/1.73   BUN/Creatinine Ratio 16 9 - 23   Sodium 142 134 - 144 mmol/L   Potassium 3.8 3.5 - 5.2 mmol/L   Chloride 103 96 - 106 mmol/L   CO2 25 20 - 29 mmol/L   Calcium 9.4 8.7 - 10.2 mg/dL   Total Protein 7.0 6.0 - 8.5 g/dL   Albumin 4.5 3.8 - 4.8 g/dL   Globulin, Total 2.5 1.5 - 4.5 g/dL   Albumin/Globulin Ratio 1.8 1.2 - 2.2   Bilirubin Total 0.4 0.0 - 1.2 mg/dL   Alkaline Phosphatase 86 44 - 121 IU/L   AST 29 0 - 40 IU/L   ALT 48 (H) 0 - 32 IU/L  Lipid Panel w/o Chol/HDL Ratio  Result Value Ref Range   Cholesterol, Total 202 (H) 100 - 199 mg/dL   Triglycerides 155 (H) 0 - 149 mg/dL  HDL 56 >39 mg/dL   VLDL Cholesterol Cal 27 5 - 40 mg/dL   LDL Chol Calc (NIH) 119 (H) 0 - 99 mg/dL  Urinalysis, Routine w reflex microscopic   Result Value Ref Range   Specific Gravity, UA >1.030 (H) 1.005 - 1.030   pH, UA 5.5 5.0 - 7.5   Color, UA Yellow Yellow   Appearance Ur Clear Clear   Leukocytes,UA Negative Negative   Protein,UA Trace (A) Negative/Trace   Glucose, UA 1+ (A) Negative   Ketones, UA Negative Negative   RBC, UA Negative Negative   Bilirubin, UA Negative Negative   Urobilinogen, Ur 0.2 0.2 - 1.0 mg/dL   Nitrite, UA Negative Negative  TSH  Result Value Ref Range   TSH 4.100 0.450 - 4.500 uIU/mL  Microalbumin, Urine Waived  Result Value Ref Range   Microalb, Ur Waived 30 (H) 0 - 19 mg/L   Creatinine, Urine Waived 300 10 - 300 mg/dL   Microalb/Creat Ratio <30 <30 mg/g  Bayer DCA Hb A1c Waived  Result Value Ref Range   HB A1C (BAYER DCA - WAIVED) 5.2 4.8 - 5.6 %      Assessment & Plan:   Problem List Items Addressed This Visit       Other   ADHD (attention deficit hyperactivity disorder) - Primary    Under good control on current regimen. Continue current regimen. Continue to monitor. Call with any concerns. Refills given for 3 months. Follow up 3 months.        Other Visit Diagnoses     Paronychia of great toe, left       Unfotunately I don't have any availability for a toenail removal- will get her into podiatry and treat with bactrim. Call with concerns.    Relevant Medications   sulfamethoxazole-trimethoprim (BACTRIM DS) 800-160 MG tablet   Other Relevant Orders   Ambulatory referral to Podiatry        Follow up plan: Return in about 3 months (around 06/10/2022).

## 2022-03-12 NOTE — Assessment & Plan Note (Signed)
Under good control on current regimen. Continue current regimen. Continue to monitor. Call with any concerns. Refills given for 3 months. Follow up 3 months.    

## 2022-03-13 DIAGNOSIS — R7309 Other abnormal glucose: Secondary | ICD-10-CM | POA: Diagnosis not present

## 2022-03-20 ENCOUNTER — Ambulatory Visit (INDEPENDENT_AMBULATORY_CARE_PROVIDER_SITE_OTHER): Payer: BC Managed Care – PPO | Admitting: Podiatry

## 2022-03-20 DIAGNOSIS — L600 Ingrowing nail: Secondary | ICD-10-CM | POA: Diagnosis not present

## 2022-03-20 MED ORDER — GENTAMICIN SULFATE 0.1 % EX CREA: 1.0000 | TOPICAL_CREAM | Freq: Two times a day (BID) | CUTANEOUS | 1 refills | Status: DC

## 2022-03-20 NOTE — Progress Notes (Signed)
   Chief Complaint  Patient presents with   Paronychia     Paronychia of great toe, Right    Subjective: Patient presents today for evaluation of pain to the medial border right great toe. Patient is concerned for possible ingrown nail.  It is very sensitive to touch.  Patient presents today for further treatment and evaluation.  Past Medical History:  Diagnosis Date   ADHD (attention deficit hyperactivity disorder)    Anemia    Anxiety    Anxiety disorder    Complication of anesthesia    Depression    Family history of adverse reaction to anesthesia    Family history of diabetes mellitus    GERD (gastroesophageal reflux disease)    HSV (herpes simplex virus) infection    Hypertension    Hypothyroidism    IBS (irritable bowel syndrome)    Lab test positive for detection of COVID-19 virus    PONV (postoperative nausea and vomiting)    Sleep apnea    Thyroid disease    hypothroidsim    Objective:  General: Well developed, nourished, in no acute distress, alert and oriented x3   Dermatology: Skin is warm, dry and supple bilateral.  Medial border right great toe is tender with evidence of an ingrowing nail. Pain on palpation noted to the border of the nail fold. The remaining nails appear unremarkable at this time. There are no open sores, lesions.  Vascular: DP and PT pulses palpable.  No clinical evidence of vascular compromise  Neruologic: Grossly intact via light touch bilateral.  Musculoskeletal: No pedal deformity noted  Assesement: #1 Paronychia with ingrowing nail medial border right great toe  Plan of Care:  1. Patient evaluated.  2. Discussed treatment alternatives and plan of care. Explained nail avulsion procedure and post procedure course to patient. 3. Patient opted for permanent partial nail avulsion of the ingrown portion of the nail.  4. Prior to procedure, local anesthesia infiltration utilized using 3 ml of a 50:50 mixture of 2% plain lidocaine and  0.5% plain marcaine in a normal hallux block fashion and a betadine prep performed.  5. Partial permanent nail avulsion with chemical matrixectomy performed using 3x30sec applications of phenol followed by alcohol flush.  6. Light dressing applied.  Post care instructions provided 7.  Prescription for gentamicin 2% cream  8.  Patient was prescribed Bactrim DS from her PCP however she developed a rash skin reaction from the antibiotic and discontinued it.  No additional oral antibiotics prescribed today  9.  Return to clinic 2 weeks.   Felecia Shelling, DPM [facility]  Dr. Felecia Shelling, DPM    [address] [place], [place] [address]                Office [phone]  Fax [phone]

## 2022-03-30 ENCOUNTER — Ambulatory Visit: Payer: BC Managed Care – PPO | Admitting: Family Medicine

## 2022-04-05 DIAGNOSIS — H53149 Visual discomfort, unspecified: Secondary | ICD-10-CM | POA: Diagnosis not present

## 2022-04-05 DIAGNOSIS — R519 Headache, unspecified: Secondary | ICD-10-CM | POA: Diagnosis not present

## 2022-04-05 DIAGNOSIS — H538 Other visual disturbances: Secondary | ICD-10-CM | POA: Diagnosis not present

## 2022-04-05 DIAGNOSIS — F0781 Postconcussional syndrome: Secondary | ICD-10-CM | POA: Diagnosis not present

## 2022-04-10 ENCOUNTER — Telehealth: Payer: Self-pay | Admitting: *Deleted

## 2022-04-10 ENCOUNTER — Ambulatory Visit: Payer: BC Managed Care – PPO | Admitting: Podiatry

## 2022-04-10 NOTE — Telephone Encounter (Signed)
"  I am calling to cancel my appointment on Tuesday.  I just want to make sure this gets canceled in time so that I'm not charged.  If you would, give me a call back."  Patient called this morning.  Pamela Avila canceled the appointment for today.

## 2022-04-13 DIAGNOSIS — R7309 Other abnormal glucose: Secondary | ICD-10-CM | POA: Diagnosis not present

## 2022-05-13 DIAGNOSIS — R7309 Other abnormal glucose: Secondary | ICD-10-CM | POA: Diagnosis not present

## 2022-05-16 DIAGNOSIS — G479 Sleep disorder, unspecified: Secondary | ICD-10-CM | POA: Diagnosis not present

## 2022-05-16 DIAGNOSIS — R519 Headache, unspecified: Secondary | ICD-10-CM | POA: Diagnosis not present

## 2022-05-16 DIAGNOSIS — H53149 Visual discomfort, unspecified: Secondary | ICD-10-CM | POA: Diagnosis not present

## 2022-05-16 DIAGNOSIS — H538 Other visual disturbances: Secondary | ICD-10-CM | POA: Diagnosis not present

## 2022-05-30 ENCOUNTER — Other Ambulatory Visit: Payer: Self-pay | Admitting: Family Medicine

## 2022-05-30 NOTE — Telephone Encounter (Signed)
Requested medication (s) are due for refill today: no has enough until 06/06/22  Requested medication (s) are on the active medication list: yes  Last refill:  12/05/21 #90 1 RF  Future visit scheduled: tomorrow  Notes to clinic:  to soon to refill- pt has appt tomorrow   Requested Prescriptions  Pending Prescriptions Disp Refills   meloxicam (MOBIC) 15 MG tablet [Pharmacy Med Name: MELOXICAM TABS 15MG] 90 tablet 3    Sig: TAKE 1 TABLET DAILY     Analgesics:  COX2 Inhibitors Failed - 05/30/2022 12:19 AM      Failed - Manual Review: Labs are only required if the patient has taken medication for more than 8 weeks.      Failed - Cr in normal range and within 360 days    Creatinine  Date Value Ref Range Status  12/29/2020 173.7 20.0 - 300.0 mg/dL Final   Creatinine, Ser  Date Value Ref Range Status  12/05/2021 1.01 (H) 0.57 - 1.00 mg/dL Final         Failed - ALT in normal range and within 360 days    ALT  Date Value Ref Range Status  12/05/2021 48 (H) 0 - 32 IU/L Final         Passed - HGB in normal range and within 360 days    Hemoglobin  Date Value Ref Range Status  12/05/2021 13.6 11.1 - 15.9 g/dL Final         Passed - HCT in normal range and within 360 days    Hematocrit  Date Value Ref Range Status  12/05/2021 40.8 34.0 - 46.6 % Final         Passed - AST in normal range and within 360 days    AST  Date Value Ref Range Status  12/05/2021 29 0 - 40 IU/L Final         Passed - eGFR is 30 or above and within 360 days    GFR calc Af Amer  Date Value Ref Range Status  12/21/2019 84 >59 mL/min/1.73 Final    Comment:    **[facility] currently reports eGFR in compliance with the current**   recommendations of the [employer]. [facility] will   update reporting as new guidelines are published from the NKF-ASN   Task force.    GFR calc non Af Amer  Date Value Ref Range Status  12/21/2019 73 >59 mL/min/1.73 Final   eGFR  Date Value Ref Range  Status  12/05/2021 68 >59 mL/min/1.73 Final         Passed - Patient is not pregnant      Passed - Valid encounter within last 12 months    Recent Outpatient Visits           2 months ago Attention deficit hyperactivity disorder (ADHD), predominantly hyperactive type   [facility], Megan P, DO   3 months ago Concussion with unknown loss of consciousness status, subsequent encounter   [place], Dani Gobble, PA-C   5 months ago Routine general medical examination at a health care facility   [facility], [place], DO   8 months ago [place], [place], DO   1 year ago Attention deficit hyperactivity disorder (ADHD), predominantly hyperactive type   [facility] Valerie Roys, DO       Future Appointments             Tomorrow  Johnson, Megan P, DO [facility]

## 2022-05-31 ENCOUNTER — Ambulatory Visit (INDEPENDENT_AMBULATORY_CARE_PROVIDER_SITE_OTHER): Payer: BC Managed Care – PPO | Admitting: Family Medicine

## 2022-05-31 ENCOUNTER — Encounter: Payer: Self-pay | Admitting: Family Medicine

## 2022-05-31 VITALS — BP 108/74 | HR 97 | Temp 98.1°F | Wt 183.7 lb

## 2022-05-31 DIAGNOSIS — I10 Essential (primary) hypertension: Secondary | ICD-10-CM

## 2022-05-31 DIAGNOSIS — E038 Other specified hypothyroidism: Secondary | ICD-10-CM | POA: Diagnosis not present

## 2022-05-31 DIAGNOSIS — Z1231 Encounter for screening mammogram for malignant neoplasm of breast: Secondary | ICD-10-CM

## 2022-05-31 DIAGNOSIS — F419 Anxiety disorder, unspecified: Secondary | ICD-10-CM | POA: Diagnosis not present

## 2022-05-31 DIAGNOSIS — F32A Depression, unspecified: Secondary | ICD-10-CM

## 2022-05-31 DIAGNOSIS — F901 Attention-deficit hyperactivity disorder, predominantly hyperactive type: Secondary | ICD-10-CM | POA: Diagnosis not present

## 2022-05-31 MED ORDER — AMPHETAMINE-DEXTROAMPHET ER 30 MG PO CP24: 30.0000 mg | ORAL_CAPSULE | ORAL | 0 refills | Status: DC

## 2022-05-31 MED ORDER — AMPHETAMINE-DEXTROAMPHETAMINE 5 MG PO TABS: 5.0000 mg | ORAL_TABLET | Freq: Every day | ORAL | 0 refills | Status: DC

## 2022-05-31 MED ORDER — AMPHETAMINE-DEXTROAMPHET ER 10 MG PO CP24: 10.0000 mg | ORAL_CAPSULE | Freq: Every day | ORAL | 0 refills | Status: DC

## 2022-05-31 MED ORDER — BUPROPION HCL ER (XL) 150 MG PO TB24: ORAL_TABLET | ORAL | 1 refills | Status: DC

## 2022-05-31 MED ORDER — ATOMOXETINE HCL 40 MG PO CAPS: 40.0000 mg | ORAL_CAPSULE | Freq: Two times a day (BID) | ORAL | 1 refills | Status: DC

## 2022-05-31 MED ORDER — DICYCLOMINE HCL 10 MG PO CAPS: 10.0000 mg | ORAL_CAPSULE | Freq: Three times a day (TID) | ORAL | 1 refills | Status: DC | PRN

## 2022-05-31 MED ORDER — LISINOPRIL 10 MG PO TABS: 10.0000 mg | ORAL_TABLET | Freq: Every day | ORAL | 1 refills | Status: DC

## 2022-05-31 MED ORDER — METOPROLOL SUCCINATE ER 25 MG PO TB24: ORAL_TABLET | ORAL | 1 refills | Status: DC

## 2022-05-31 MED ORDER — BUSPIRONE HCL 5 MG PO TABS: 5.0000 mg | ORAL_TABLET | Freq: Three times a day (TID) | ORAL | 1 refills | Status: DC | PRN

## 2022-05-31 MED ORDER — OMEPRAZOLE 20 MG PO CPDR: DELAYED_RELEASE_CAPSULE | ORAL | 1 refills | Status: DC

## 2022-05-31 NOTE — Assessment & Plan Note (Signed)
Under good control on current regimen. Continue current regimen. Continue to monitor. Call with any concerns. Refills given. Labs drawn today.   

## 2022-05-31 NOTE — Progress Notes (Signed)
BP 108/74   Pulse 97   Temp 98.1 F (36.7 C)   Wt 183 lb 11.2 oz (83.3 kg)   SpO2 95%   BMI 35.58 kg/m    Subjective:    Patient ID: Pamela Avila, female    DOB: [birth year], 51 y.o.   MRN: [id number]  HPI: Pamela Avila is a 51 y.o. female  Chief Complaint  Patient presents with   Hypothyroidism   Hypertension   ADHD   Anxiety    Patient states neurologist took her off of lexapro and put her on venlafaxine.    HYPERTENSION  Hypertension status: controlled  Satisfied with current treatment? yes Duration of hypertension: chronic BP medication side effects:  no Medication compliance: excellent compliance Previous BP meds: lisinopril, metoprolol Aspirin: no Recurrent headaches: no Visual changes: no Palpitations: no Dyspnea: no Chest pain: no Lower extremity edema: no Dizzy/lightheaded: no  HYPOTHYROIDISM Thyroid control status:controlled Satisfied with current treatment? yes Medication side effects: no Medication compliance: excellent compliance Recent dose adjustment:no Fatigue: no Cold intolerance: no Heat intolerance: no Weight gain: no Weight loss: no Constipation: no Diarrhea/loose stools: no Palpitations: no Lower extremity edema: no Anxiety/depressed mood: no  ANXIETY/STRESS Duration:controlled Anxious mood: yes  Excessive worrying: yes Irritability: yes  Sweating: no Nausea: no Palpitations:no Hyperventilation: no Panic attacks: no Agoraphobia: no  Obscessions/compulsions: no Depressed mood: yes    05/31/2022    3:38 PM 03/12/2022    2:30 PM 02/14/2022    9:27 AM 12/05/2021    4:19 PM 09/21/2021    3:59 PM  Depression screen PHQ 2/9  Decreased Interest 2 0 2 1 0  Down, Depressed, Hopeless 1 1 0 1 0  PHQ - 2 Score _0 0  Altered sleeping _1 Tired, decreased energy _2 Change in appetite 0 0 3 1 0  Feeling bad or failure about yourself  0 0 0 1 0  Trouble concentrating _3 0 3  Moving slowly or fidgety/restless 3  0 3 0 1  Suicidal thoughts 0 0 0 0 0  PHQ-9 Score _4 Difficult doing work/chores Extremely dIfficult  Somewhat difficult  Not difficult at all   Anhedonia: no Weight changes: no Insomnia: no   Hypersomnia: no Fatigue/loss of energy: no Feelings of worthlessness: no Feelings of guilt: no Impaired concentration/indecisiveness: no Suicidal ideations: no  Crying spells: no Recent Stressors/Life Changes: no   Relationship problems: no   Family stress: no     Financial stress: no    Job stress: no    Recent death/loss: no  ADHD FOLLOW UP ADHD status: stable Satisfied with current therapy: yes Medication compliance:  excellent compliance Controlled substance contract: yes Previous psychiatry evaluation: yes Previous medications: yes    Taking meds on weekends/vacations: yes Work/school performance:   recently lost her job Difficulty sustaining attention/completing tasks: yes Distracted by extraneous stimuli: yes Does not listen when spoken to: no  Fidgets with hands or feet: no Unable to stay in seat: no Blurts out/interrupts others: no ADHD Medication Side Effects: no    Decreased appetite: no    Headache: no    Sleeping disturbance pattern: no    Irritability: no    Rebound effects (worse than baseline) off medication: no    Anxiousness: no    Dizziness: no    Tics: no   Relevant past medical, surgical, family and social history reviewed and  updated as indicated. Interim medical history since our last visit reviewed. Allergies and medications reviewed and updated.  Review of Systems  Constitutional: Negative.   Respiratory: Negative.    Cardiovascular: Negative.   Gastrointestinal: Negative.   Musculoskeletal: Negative.   Neurological: Negative.   Psychiatric/Behavioral: Negative.      Per HPI unless specifically indicated above     Objective:    BP 108/74   Pulse 97   Temp 98.1 F (36.7 C)   Wt 183 lb 11.2 oz (83.3 kg)   SpO2 95%   BMI  35.58 kg/m   Wt Readings from Last 3 Encounters:  05/31/22 183 lb 11.2 oz (83.3 kg)  03/12/22 189 lb 3.2 oz (85.8 kg)  02/14/22 186 lb 12.8 oz (84.7 kg)    Physical Exam Vitals and nursing note reviewed.  Constitutional:      General: She is not in acute distress.    Appearance: Normal appearance. She is not ill-appearing, toxic-appearing or diaphoretic.  HENT:     Head: Normocephalic and atraumatic.     Right Ear: External ear normal.     Left Ear: External ear normal.     Nose: Nose normal.     Mouth/Throat:     Mouth: Mucous membranes are moist.     Pharynx: Oropharynx is clear.  Eyes:     General: No scleral icterus.       Right eye: No discharge.        Left eye: No discharge.     Extraocular Movements: Extraocular movements intact.     Conjunctiva/sclera: Conjunctivae normal.     Pupils: Pupils are equal, round, and reactive to light.  Cardiovascular:     Rate and Rhythm: Normal rate and regular rhythm.     Pulses: Normal pulses.     Heart sounds: Normal heart sounds. No murmur heard.    No friction rub. No gallop.  Pulmonary:     Effort: Pulmonary effort is normal. No respiratory distress.     Breath sounds: Normal breath sounds. No stridor. No wheezing, rhonchi or rales.  Chest:     Chest wall: No tenderness.  Musculoskeletal:        General: Normal range of motion.     Cervical back: Normal range of motion and neck supple.  Skin:    General: Skin is warm and dry.     Capillary Refill: Capillary refill takes less than 2 seconds.     Coloration: Skin is not jaundiced or pale.     Findings: No bruising, erythema, lesion or rash.  Neurological:     General: No focal deficit present.     Mental Status: She is alert and oriented to person, place, and time. Mental status is at baseline.  Psychiatric:        Mood and Affect: Mood normal.        Behavior: Behavior normal.        Thought Content: Thought content normal.        Judgment: Judgment normal.      Results for orders placed or performed in visit on 12/05/21  CBC with Differential/Platelet  Result Value Ref Range   WBC 7.1 3.4 - 10.8 x10E3/uL   RBC 4.49 3.77 - 5.28 x10E6/uL   Hemoglobin 13.6 11.1 - 15.9 g/dL   Hematocrit 40.8 34.0 - 46.6 %   MCV 91 79 - 97 fL   MCH 30.3 26.6 - 33.0 pg   MCHC 33.3 31.5 - 35.7 g/dL  RDW 12.9 11.7 - 15.4 %   Platelets 304 150 - 450 x10E3/uL   Neutrophils 48 Not Estab. %   Lymphs 40 Not Estab. %   Monocytes 9 Not Estab. %   Eos 2 Not Estab. %   Basos 1 Not Estab. %   Neutrophils Absolute 3.4 1.4 - 7.0 x10E3/uL   Lymphocytes Absolute 2.8 0.7 - 3.1 x10E3/uL   Monocytes Absolute 0.6 0.1 - 0.9 x10E3/uL   EOS (ABSOLUTE) 0.2 0.0 - 0.4 x10E3/uL   Basophils Absolute 0.1 0.0 - 0.2 x10E3/uL   Immature Granulocytes 0 Not Estab. %   Immature Grans (Abs) 0.0 0.0 - 0.1 x10E3/uL  Comprehensive metabolic panel  Result Value Ref Range   Glucose 77 70 - 99 mg/dL   BUN 16 6 - 24 mg/dL   Creatinine, Ser 1.01 (H) 0.57 - 1.00 mg/dL   eGFR 68 >59 mL/min/1.73   BUN/Creatinine Ratio 16 9 - 23   Sodium 142 134 - 144 mmol/L   Potassium 3.8 3.5 - 5.2 mmol/L   Chloride 103 96 - 106 mmol/L   CO2 25 20 - 29 mmol/L   Calcium 9.4 8.7 - 10.2 mg/dL   Total Protein 7.0 6.0 - 8.5 g/dL   Albumin 4.5 3.8 - 4.8 g/dL   Globulin, Total 2.5 1.5 - 4.5 g/dL   Albumin/Globulin Ratio 1.8 1.2 - 2.2   Bilirubin Total 0.4 0.0 - 1.2 mg/dL   Alkaline Phosphatase 86 44 - 121 IU/L   AST 29 0 - 40 IU/L   ALT 48 (H) 0 - 32 IU/L  Lipid Panel w/o Chol/HDL Ratio  Result Value Ref Range   Cholesterol, Total 202 (H) 100 - 199 mg/dL   Triglycerides 155 (H) 0 - 149 mg/dL   HDL 56 >39 mg/dL   VLDL Cholesterol Cal 27 5 - 40 mg/dL   LDL Chol Calc (NIH) 119 (H) 0 - 99 mg/dL  Urinalysis, Routine w reflex microscopic  Result Value Ref Range   Specific Gravity, UA >1.030 (H) 1.005 - 1.030   pH, UA 5.5 5.0 - 7.5   Color, UA Yellow Yellow   Appearance Ur Clear Clear   Leukocytes,UA  Negative Negative   Protein,UA Trace (A) Negative/Trace   Glucose, UA 1+ (A) Negative   Ketones, UA Negative Negative   RBC, UA Negative Negative   Bilirubin, UA Negative Negative   Urobilinogen, Ur 0.2 0.2 - 1.0 mg/dL   Nitrite, UA Negative Negative  TSH  Result Value Ref Range   TSH 4.100 0.450 - 4.500 uIU/mL  Microalbumin, Urine Waived  Result Value Ref Range   Microalb, Ur Waived 30 (H) 0 - 19 mg/L   Creatinine, Urine Waived 300 10 - 300 mg/dL   Microalb/Creat Ratio <30 <30 mg/g  Bayer DCA Hb A1c Waived  Result Value Ref Range   HB A1C (BAYER DCA - WAIVED) 5.2 4.8 - 5.6 %      Assessment & Plan:   Problem List Items Addressed This Visit       Cardiovascular and Mediastinum   Essential hypertension, benign    Under good control on current regimen. Continue current regimen. Continue to monitor. Call with any concerns. Refills given. Labs drawn today.        Relevant Medications   metoprolol succinate (TOPROL-XL) 25 MG 24 hr tablet   lisinopril (ZESTRIL) 10 MG tablet   Other Relevant Orders   Basic metabolic panel     Endocrine   Hypothyroidism - Primary  Rechecking labs today. Await results. Treat as needed.       Relevant Medications   metoprolol succinate (TOPROL-XL) 25 MG 24 hr tablet   Other Relevant Orders   TSH     Other   ADHD (attention deficit hyperactivity disorder)    Under good control on current regimen. Continue current regimen. Continue to monitor. Call with any concerns. Refills given for 3 months. Follow up 3 months.      Relevant Orders   Ambulatory referral to Neuropsychology   Anxiety and depression    Under good control on current regimen. Continue current regimen. Continue to monitor. Call with any concerns. Refills given for 3 months. Follow up 3 months.      Relevant Medications   nortriptyline (PAMELOR) 10 MG capsule   venlafaxine XR (EFFEXOR-XR) 75 MG 24 hr capsule   buPROPion (WELLBUTRIN XL) 150 MG 24 hr tablet   busPIRone  (BUSPAR) 5 MG tablet   Other Relevant Orders   Ambulatory referral to Neuropsychology   Other Visit Diagnoses     Encounter for screening mammogram for malignant neoplasm of breast       Mammogram ordered today.   Relevant Orders   MM 3D SCREEN BREAST BILATERAL        Follow up plan: Return before 09/16/22.

## 2022-05-31 NOTE — Assessment & Plan Note (Signed)
Under good control on current regimen. Continue current regimen. Continue to monitor. Call with any concerns. Refills given for 3 months. Follow up 3 months.    

## 2022-05-31 NOTE — Assessment & Plan Note (Signed)
Rechecking labs today. Await results. Treat as needed.  °

## 2022-06-01 ENCOUNTER — Other Ambulatory Visit: Payer: Self-pay | Admitting: Family Medicine

## 2022-06-01 LAB — BASIC METABOLIC PANEL
BUN/Creatinine Ratio: 17 (ref 9–23)
BUN: 14 mg/dL (ref 6–24)
CO2: 24 mmol/L (ref 20–29)
Calcium: 9.5 mg/dL (ref 8.7–10.2)
Chloride: 98 mmol/L (ref 96–106)
Creatinine, Ser: 0.81 mg/dL (ref 0.57–1.00)
Glucose: 102 mg/dL — ABNORMAL HIGH (ref 70–99)
Potassium: 4 mmol/L (ref 3.5–5.2)
Sodium: 136 mmol/L (ref 134–144)
eGFR: 88 mL/min/{1.73_m2} (ref >59)

## 2022-06-01 LAB — TSH: TSH: 1.07 u[IU]/mL (ref 0.450–4.500)

## 2022-06-01 MED ORDER — LEVOTHYROXINE SODIUM 112 MCG PO TABS: 112.0000 ug | ORAL_TABLET | Freq: Every day | ORAL | 3 refills | Status: DC

## 2022-06-05 ENCOUNTER — Ambulatory Visit
Admission: RE | Admit: 2022-06-05 | Discharge: 2022-06-05 | Disposition: A | Discharge status: Home / Self Care | Payer: BC Managed Care – PPO | Source: Ambulatory Visit | Attending: Family Medicine | Admitting: Family Medicine

## 2022-06-05 DIAGNOSIS — Z1231 Encounter for screening mammogram for malignant neoplasm of breast: Secondary | ICD-10-CM | POA: Insufficient documentation

## 2022-06-07 ENCOUNTER — Other Ambulatory Visit: Payer: Self-pay | Admitting: Family Medicine

## 2022-06-07 DIAGNOSIS — N6489 Other specified disorders of breast: Secondary | ICD-10-CM

## 2022-06-07 DIAGNOSIS — R928 Other abnormal and inconclusive findings on diagnostic imaging of breast: Secondary | ICD-10-CM

## 2022-06-07 NOTE — Progress Notes (Signed)
Contacted via [name], your mammogram has returned and they are recommending repeat mammogram and ultrasound of the right side.  Please call [facility] to schedule these.

## 2022-06-12 ENCOUNTER — Ambulatory Visit
Admission: RE | Admit: 2022-06-12 | Discharge: 2022-06-12 | Disposition: A | Discharge status: Home / Self Care | Payer: BC Managed Care – PPO | Source: Ambulatory Visit | Attending: Family Medicine | Admitting: Family Medicine

## 2022-06-12 ENCOUNTER — Other Ambulatory Visit: Payer: Self-pay

## 2022-06-12 ENCOUNTER — Other Ambulatory Visit: Payer: Self-pay | Admitting: Family Medicine

## 2022-06-12 DIAGNOSIS — N6489 Other specified disorders of breast: Secondary | ICD-10-CM

## 2022-06-12 DIAGNOSIS — R928 Other abnormal and inconclusive findings on diagnostic imaging of breast: Secondary | ICD-10-CM

## 2022-06-12 DIAGNOSIS — N6320 Unspecified lump in the left breast, unspecified quadrant: Secondary | ICD-10-CM | POA: Insufficient documentation

## 2022-06-12 IMAGING — US US THYROID
1 series · 13 of 25 positions shown · non-contrast
Comparison: None.

CLINICAL DATA: Goiter.  Hypothyroidism.

EXAM:
THYROID ULTRASOUND
TECHNIQUE: Ultrasound examination of the thyroid gland and adjacent soft
tissues was performed.

[Series 1: us thyroid · 0.06mm/px · 71 acquisitions, 13 frames shown]
[im 1/71]
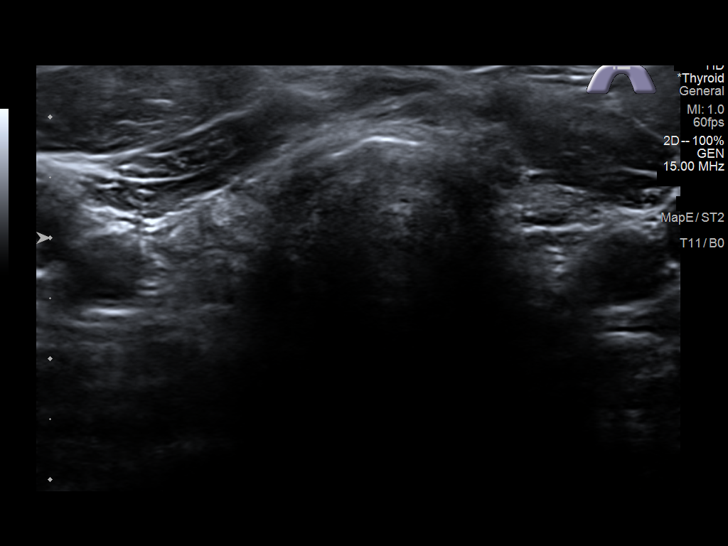
[im 6/71]
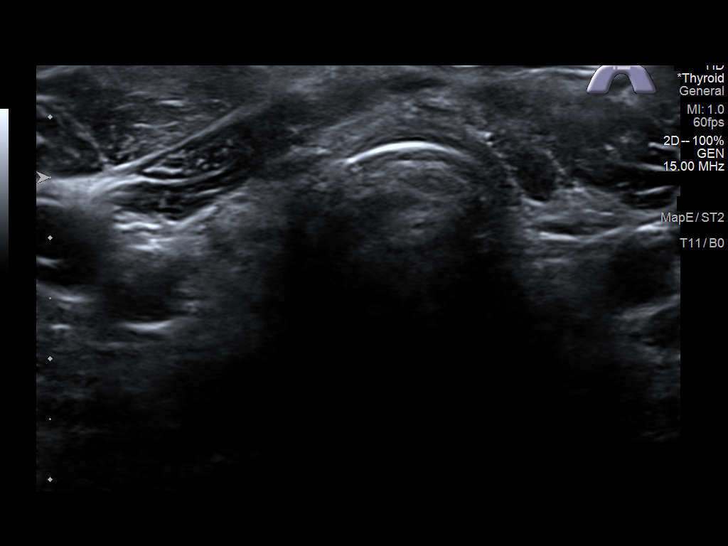
[im 12/71]
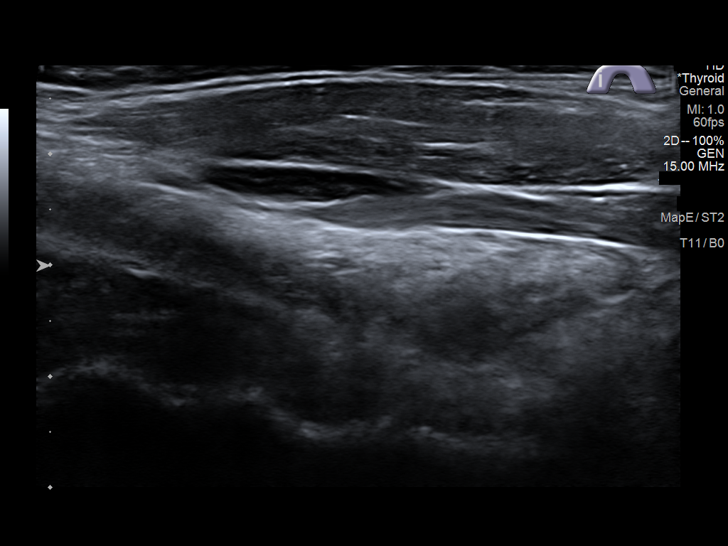
[im 18/71]
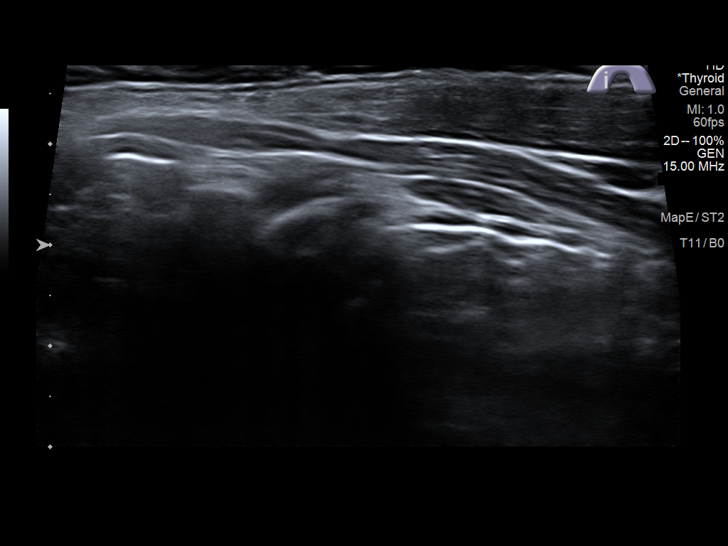
[im 24/71]
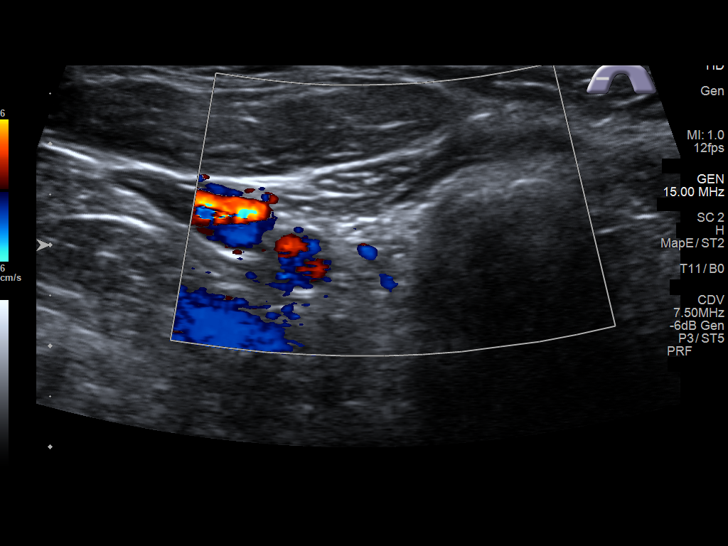
[im 30/71]
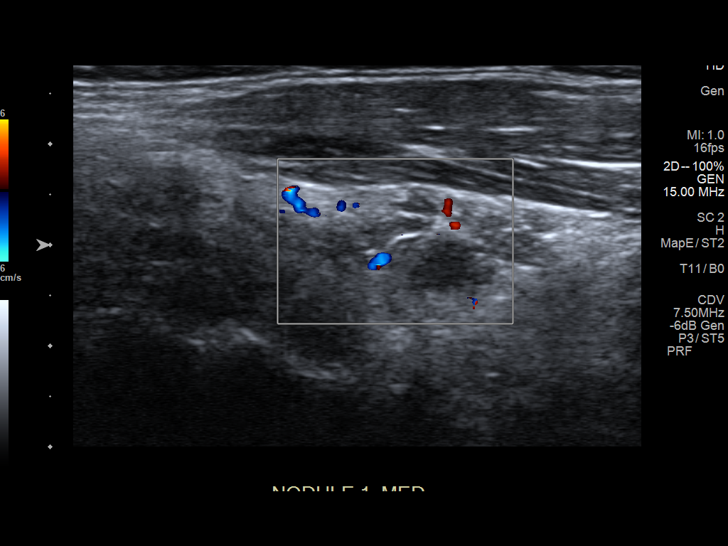
[im 36/71]
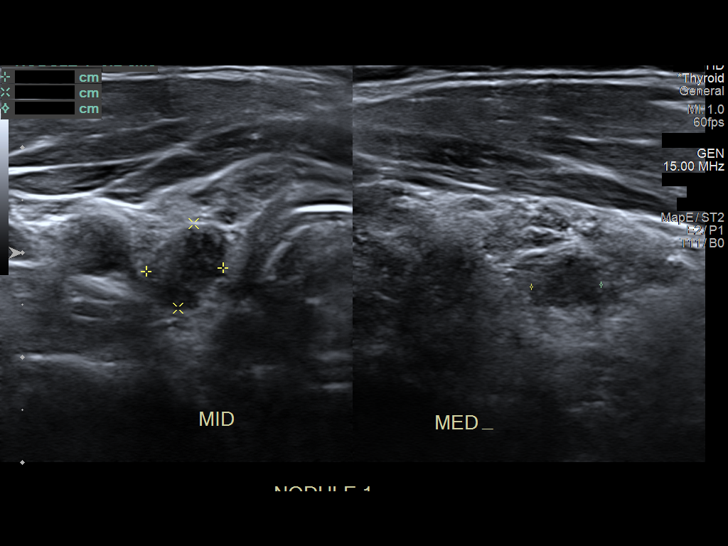
[im 41/71]
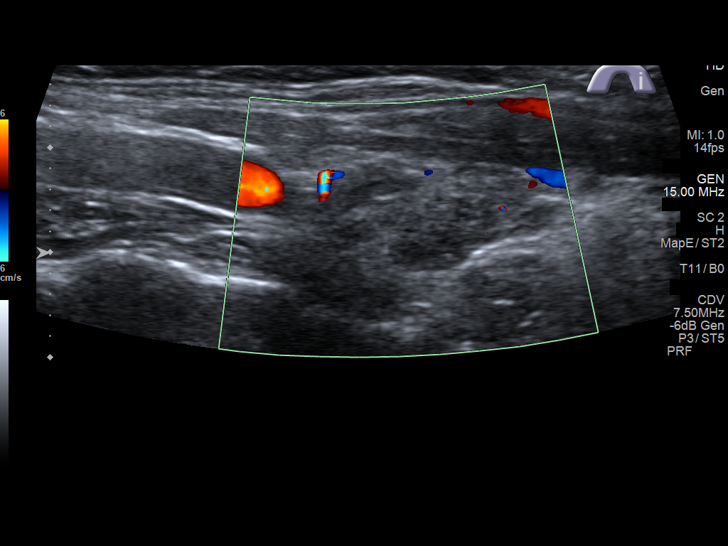
[im 47/71]
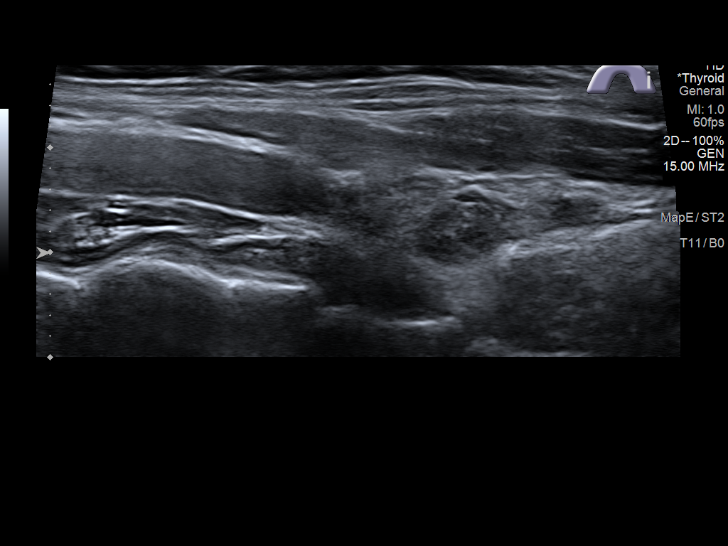
[im 53/71]
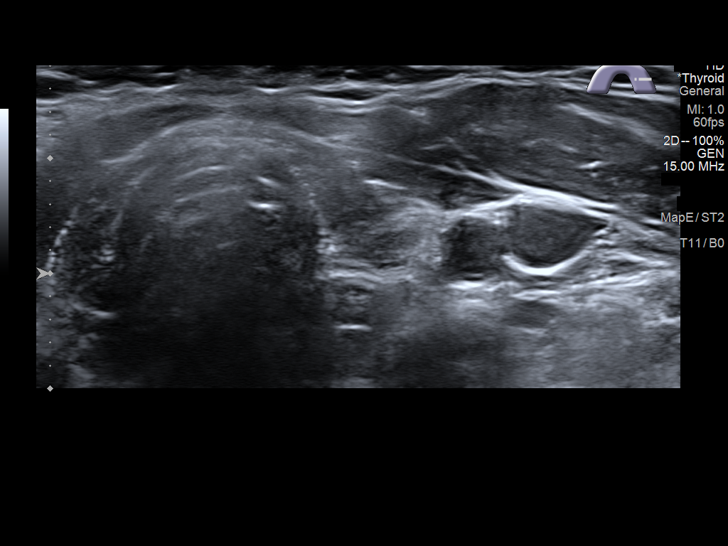
[im 59/71]
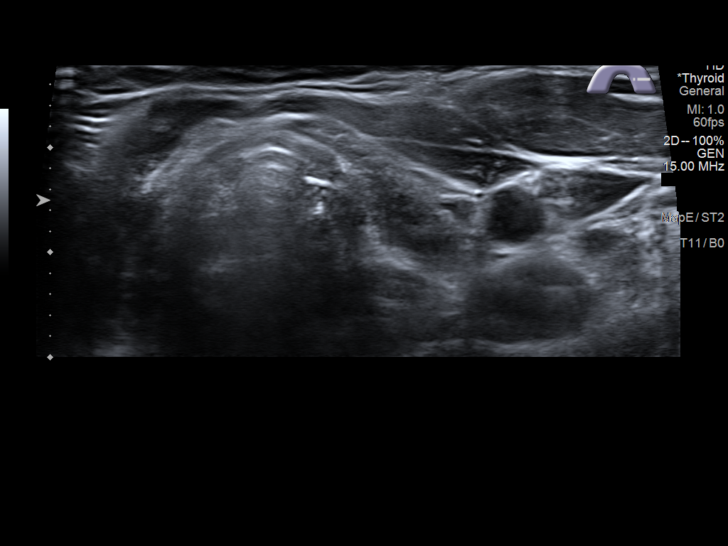
[im 65/71]
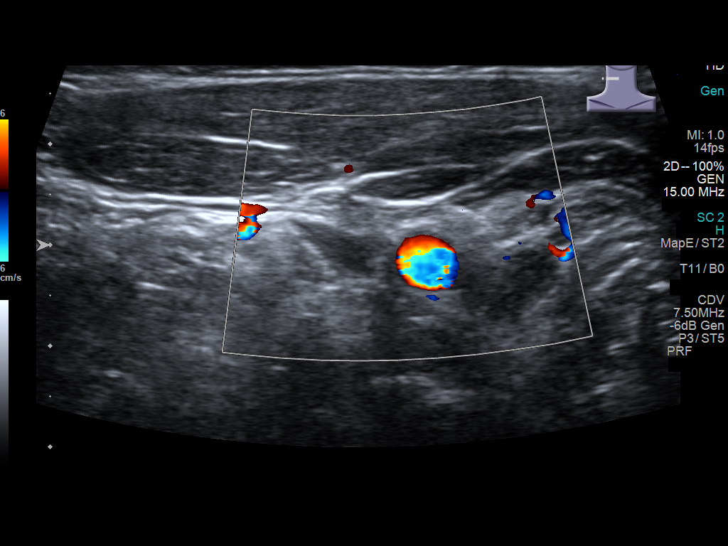
[im 71/71]
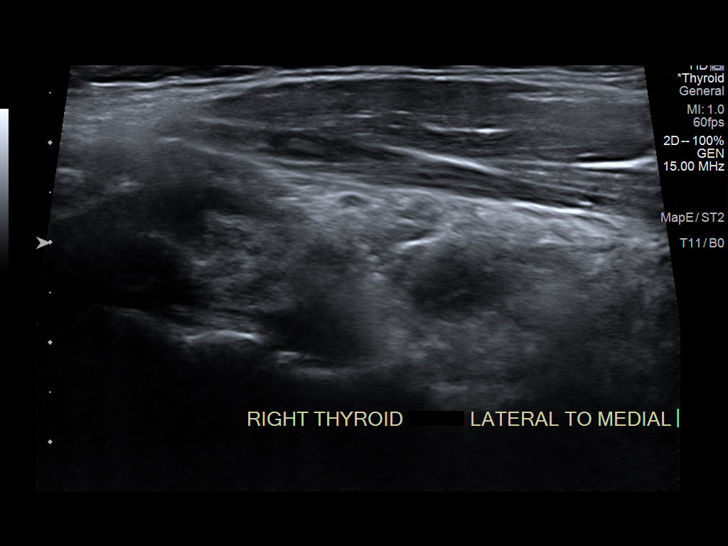

[13 of 25 positions shown; findings below may reference images not displayed]

FINDINGS: Parenchymal Echotexture: Markedly heterogenous - no evidence of
glandular hyperemia.

Isthmus: Atrophic measuring 0.2 cm in diameter

Right lobe: Atrophic measuring 3.3 x 1.2 x 0.8 cm

Left lobe: Atrophic measuring 2.8 x 1.1 x 1.1 cm

_________________________________________________________

Estimated total number of nodules >/= 1 cm: 0

Number of spongiform nodules >/=  2 cm not described below (TR1): 0

Number of mixed cystic and solid nodules >/= 1.5 cm not described
below (TR2): 0

_________________________________________________________

There is an approximately 0.8 x 0.7 x 0.7 cm hypoechoic ill-defined
nodule/pseudonodule within the mid aspect the left lobe of the
thyroid (labeled 1), which does not meet criteria to recommend
percutaneous sampling or continued dedicated follow-up.
IMPRESSION: 1. Atrophic and markedly heterogeneous appearing thyroid without
worrisome nodule or mass. Findings are nonspecific though could be
seen in the setting of a chronic thyroiditis.
2. Solitary 0.8 cm right-sided nodule/pseudonodule does not meet
criteria to recommend percutaneous sampling or continued dedicated
follow-up.

The above is in keeping with the ACR TI-RADS recommendations - [HOSPITAL] 8106;[DATE].

## 2022-06-13 DIAGNOSIS — R7309 Other abnormal glucose: Secondary | ICD-10-CM | POA: Diagnosis not present

## 2022-06-18 DIAGNOSIS — H538 Other visual disturbances: Secondary | ICD-10-CM | POA: Diagnosis not present

## 2022-06-18 DIAGNOSIS — R4189 Other symptoms and signs involving cognitive functions and awareness: Secondary | ICD-10-CM | POA: Diagnosis not present

## 2022-06-18 DIAGNOSIS — R519 Headache, unspecified: Secondary | ICD-10-CM | POA: Diagnosis not present

## 2022-06-18 DIAGNOSIS — F0781 Postconcussional syndrome: Secondary | ICD-10-CM | POA: Diagnosis not present

## 2022-06-20 ENCOUNTER — Ambulatory Visit (INDEPENDENT_AMBULATORY_CARE_PROVIDER_SITE_OTHER): Payer: BC Managed Care – PPO | Admitting: Physician Assistant

## 2022-06-20 ENCOUNTER — Encounter: Payer: Self-pay | Admitting: Physician Assistant

## 2022-06-20 VITALS — BP 91/63 | HR 91 | Temp 98.4°F | Ht 60.24 in | Wt 186.6 lb

## 2022-06-20 DIAGNOSIS — J069 Acute upper respiratory infection, unspecified: Secondary | ICD-10-CM

## 2022-06-20 MED ORDER — BENZONATATE 200 MG PO CAPS: 200.0000 mg | ORAL_CAPSULE | Freq: Two times a day (BID) | ORAL | 0 refills | Status: DC | PRN

## 2022-06-20 NOTE — Progress Notes (Unsigned)
Acute Office Visit   Patient: Pamela Avila   DOB: [birth year]   51 y.o. Female  MRN: [id number] Visit Date: 06/20/2022  Today's healthcare provider: Oswaldo Conroy Nare Gaspari, PA-C  Introduced myself to the patient as a Secondary school teacher and provided education on APPs in clinical practice.    Chief Complaint  Patient presents with   Sore Throat    Cough, congestion, hoarse, headache, started on Thursday. Has not taken anything OTC   Subjective    Sore Throat  Associated symptoms include congestion, coughing and headaches (Reports she has post concussion syndrome so is not sure if this is from that). Pertinent negatives include no diarrhea, ear pain, shortness of breath, trouble swallowing or vomiting.   HPI     Sore Throat    Additional comments: Cough, congestion, hoarse, headache, started on Thursday. Has not taken anything OTC      Last edited by Sherolyn Buba, CMA on 06/20/2022  8:53 AM.       Onset: gradual Duration: Started on Thursday   Reports she is coughing and has sore throat She reports productive cough, sore throat, and  She reports several recent sick contacts with similar symptoms  Interventions: nothing yet Reports her Neurologist told her not to take OTC pain medications more than twice per week to help rule out rebound headaches  She is concerned for strep as she has had recent contact with someone infected  States her biggest concern is the coughing as it is keeping her awake and reducing her appetite     Medications: Outpatient Medications Prior to Visit  Medication Sig   [START ON 08/17/2022] amphetamine-dextroamphetamine (ADDERALL XR) 10 MG 24 hr capsule Take 1 capsule (10 mg total) by mouth daily. To be taken with 30mg  XR for 40mg  total   [START ON 07/18/2022] amphetamine-dextroamphetamine (ADDERALL XR) 10 MG 24 hr capsule Take 1 capsule (10 mg total) by mouth daily. To be taken with 30mg  XR for 40mg  total   amphetamine-dextroamphetamine (ADDERALL XR) 10 MG  24 hr capsule Take 1 capsule (10 mg total) by mouth daily. To be taken with 30mg  XR for 40mg  total   [START ON 08/17/2022] amphetamine-dextroamphetamine (ADDERALL XR) 30 MG 24 hr capsule Take 1 capsule (30 mg total) by mouth every morning. To be taken with 10mg  XR for 40mg  total   [START ON 07/18/2022] amphetamine-dextroamphetamine (ADDERALL XR) 30 MG 24 hr capsule Take 1 capsule (30 mg total) by mouth every morning. To be taken with 10mg  XR for 40mg  total   amphetamine-dextroamphetamine (ADDERALL XR) 30 MG 24 hr capsule Take 1 capsule (30 mg total) by mouth every morning. To be taken with 10mg  XR for 40mg  total   [START ON 08/17/2022] amphetamine-dextroamphetamine (ADDERALL) 5 MG tablet Take 1 tablet (5 mg total) by mouth daily.   [START ON 07/18/2022] amphetamine-dextroamphetamine (ADDERALL) 5 MG tablet Take 1 tablet (5 mg total) by mouth daily. PRN on longer days when you need to focus   amphetamine-dextroamphetamine (ADDERALL) 5 MG tablet Take 1 tablet (5 mg total) by mouth daily.   atomoxetine (STRATTERA) 40 MG capsule Take 1 capsule (40 mg total) by mouth 2 (two) times daily with a meal.   buPROPion (WELLBUTRIN XL) 150 MG 24 hr tablet TAKE 1 TABLET BY MOUTH EVERY DAY GENERIC EQUIVALENT FOR WELLBUTRIN XL   busPIRone (BUSPAR) 5 MG tablet Take 1-2 tablets (5-10 mg total) by mouth 3 (three) times daily as needed (anxiety).   dicyclomine (  BENTYL) 10 MG capsule Take 1 capsule (10 mg total) by mouth 3 (three) times daily as needed for spasms.   levothyroxine (SYNTHROID) 112 MCG tablet Take 1 tablet (112 mcg total) by mouth daily.   lisinopril (ZESTRIL) 10 MG tablet Take 1 tablet (10 mg total) by mouth daily.   meclizine (ANTIVERT) 25 MG tablet Take 1 tablet (25 mg total) by mouth 3 (three) times daily as needed for dizziness.   meloxicam (MOBIC) 15 MG tablet TAKE 1 TABLET DAILY   metoprolol succinate (TOPROL-XL) 25 MG 24 hr tablet TAKE ONE AND ONE-HALF TABLETS( 37.5 MG TOTAL) BY MOUTH DAILY GENERIC  EQUIVALENT FOR TOPROL XL   nortriptyline (PAMELOR) 10 MG capsule Take by mouth 2 (two) times daily.   omeprazole (PRILOSEC) 20 MG capsule TAKE ONE CAPSULE BY MOUTH TWICE DAILY AS NEEDED FOR HEARTBURN/ INDIGESTION.   ondansetron (ZOFRAN-ODT) 4 MG disintegrating tablet Take 4 mg by mouth every 8 (eight) hours as needed.   tiZANidine (ZANAFLEX) 2 MG tablet Take 1 tablet (2 mg total) by mouth every 6 (six) hours as needed for muscle spasms.   venlafaxine XR (EFFEXOR-XR) 75 MG 24 hr capsule Take 75 mg by mouth daily.   No facility-administered medications prior to visit.    Review of Systems  Constitutional:  Negative for chills, fatigue and fever.  HENT:  Positive for congestion, postnasal drip, rhinorrhea and sore throat. Negative for ear pain and trouble swallowing.        Ear fullness   Respiratory:  Positive for cough and wheezing. Negative for chest tightness and shortness of breath.   Gastrointestinal:  Positive for constipation. Negative for diarrhea, nausea and vomiting.  Neurological:  Positive for dizziness and headaches (Reports she has post concussion syndrome so is not sure if this is from that).    {Labs  Heme  Chem  Endocrine  Serology  Results Review (optional):23779}   Objective    BP 91/63   Pulse 91   Temp 98.4 F (36.9 C) (Oral)   Ht 5' 0.24" (1.53 m)   Wt 186 lb 9.6 oz (84.6 kg)   BMI 36.16 kg/m  {Show previous vital signs (optional):23777}  Physical Exam Vitals reviewed.  Constitutional:      General: She is awake.     Appearance: Normal appearance. She is well-developed and well-groomed.  HENT:     Head: Normocephalic and atraumatic.  Neurological:     Mental Status: She is alert.  Psychiatric:        Behavior: Behavior is cooperative.       No results found for any visits on 06/20/22.  Assessment & Plan      No follow-ups on file.

## 2022-06-20 NOTE — Patient Instructions (Addendum)
  Based on your described symptoms and the duration of symptoms it is likely that you have a viral upper respiratory infection (often called a "cold")  Symptoms can last for 3-10 days with lingering cough and intermittent symptoms lasting weeks after that.  The goal of treatment at this time is to reduce your symptoms and discomfort   I have sent in Tessalon pearls for you to take twice per day to help with your cough  You can use Mucinex and Robitussin to help with your symptoms as well   If your symptoms do not improve or become worse in the next 5-7 days please make an apt at the office so we can see you  Go to the ER if you begin to have more serious symptoms such as shortness of breath, trouble breathing, loss of consciousness, swelling around the eyes, high fever, severe lasting headaches, vision changes or neck pain/stiffness.

## 2022-06-22 LAB — NOVEL CORONAVIRUS, NAA: SARS-CoV-2, NAA: NOT DETECTED

## 2022-06-23 LAB — VERITOR FLU A/B WAIVED
Influenza A: NEGATIVE
Influenza B: NEGATIVE

## 2022-06-23 LAB — RAPID STREP SCREEN (MED CTR MEBANE ONLY): Strep Gp A Ag, IA W/Reflex: NEGATIVE

## 2022-06-23 LAB — CULTURE, GROUP A STREP

## 2022-06-27 DIAGNOSIS — H53149 Visual discomfort, unspecified: Secondary | ICD-10-CM | POA: Diagnosis not present

## 2022-06-27 DIAGNOSIS — R519 Headache, unspecified: Secondary | ICD-10-CM | POA: Diagnosis not present

## 2022-06-27 DIAGNOSIS — R4189 Other symptoms and signs involving cognitive functions and awareness: Secondary | ICD-10-CM | POA: Diagnosis not present

## 2022-06-27 DIAGNOSIS — F0781 Postconcussional syndrome: Secondary | ICD-10-CM | POA: Diagnosis not present

## 2022-07-13 ENCOUNTER — Other Ambulatory Visit: Payer: Self-pay | Admitting: Student

## 2022-07-13 DIAGNOSIS — R7309 Other abnormal glucose: Secondary | ICD-10-CM | POA: Diagnosis not present

## 2022-07-13 DIAGNOSIS — F419 Anxiety disorder, unspecified: Secondary | ICD-10-CM

## 2022-07-13 DIAGNOSIS — F411 Generalized anxiety disorder: Secondary | ICD-10-CM

## 2022-07-13 DIAGNOSIS — F0781 Postconcussional syndrome: Secondary | ICD-10-CM

## 2022-07-13 DIAGNOSIS — R110 Nausea: Secondary | ICD-10-CM

## 2022-07-13 DIAGNOSIS — H538 Other visual disturbances: Secondary | ICD-10-CM

## 2022-07-13 DIAGNOSIS — R519 Headache, unspecified: Secondary | ICD-10-CM

## 2022-07-13 DIAGNOSIS — G479 Sleep disorder, unspecified: Secondary | ICD-10-CM

## 2022-07-13 DIAGNOSIS — R4189 Other symptoms and signs involving cognitive functions and awareness: Secondary | ICD-10-CM

## 2022-07-22 ENCOUNTER — Ambulatory Visit
Admission: RE | Admit: 2022-07-22 | Discharge: 2022-07-22 | Disposition: A | Discharge status: Home / Self Care | Payer: BC Managed Care – PPO | Source: Ambulatory Visit | Attending: Student | Admitting: Student

## 2022-07-22 DIAGNOSIS — F419 Anxiety disorder, unspecified: Secondary | ICD-10-CM

## 2022-07-22 DIAGNOSIS — F411 Generalized anxiety disorder: Secondary | ICD-10-CM

## 2022-07-22 DIAGNOSIS — R4589 Other symptoms and signs involving emotional state: Secondary | ICD-10-CM

## 2022-07-22 DIAGNOSIS — H538 Other visual disturbances: Secondary | ICD-10-CM

## 2022-07-22 DIAGNOSIS — R42 Dizziness and giddiness: Secondary | ICD-10-CM | POA: Diagnosis not present

## 2022-07-22 DIAGNOSIS — R110 Nausea: Secondary | ICD-10-CM

## 2022-07-22 DIAGNOSIS — G479 Sleep disorder, unspecified: Secondary | ICD-10-CM

## 2022-07-22 DIAGNOSIS — F0781 Postconcussional syndrome: Secondary | ICD-10-CM

## 2022-07-22 DIAGNOSIS — R4189 Other symptoms and signs involving cognitive functions and awareness: Secondary | ICD-10-CM

## 2022-07-22 DIAGNOSIS — R519 Headache, unspecified: Secondary | ICD-10-CM | POA: Diagnosis not present

## 2022-08-01 DIAGNOSIS — R519 Headache, unspecified: Secondary | ICD-10-CM | POA: Diagnosis not present

## 2022-08-01 DIAGNOSIS — R4189 Other symptoms and signs involving cognitive functions and awareness: Secondary | ICD-10-CM | POA: Diagnosis not present

## 2022-08-01 DIAGNOSIS — F419 Anxiety disorder, unspecified: Secondary | ICD-10-CM | POA: Diagnosis not present

## 2022-08-01 DIAGNOSIS — G479 Sleep disorder, unspecified: Secondary | ICD-10-CM | POA: Diagnosis not present

## 2022-08-07 ENCOUNTER — Ambulatory Visit: Payer: Self-pay

## 2022-08-07 NOTE — Telephone Encounter (Signed)
Chief Complaint: Cough Symptoms: Sore throat, fever, wheezing Frequency: Onset last Thursday Pertinent Negatives: Patient denies chest pain Disposition: [] ED /[] Urgent Care (no appt availability in office) / [x] Appointment(In office/virtual)/ []  [facility]/ [] Home Care/ [x] Refused Recommended Disposition /[] [facility]/ []  Follow-up with PCP Additional Notes: Advised no availability in the office until Friday, she says she will just go to the UC if she gets worse, she will go to the drug store for a COVID test and continue taking OTC medications. Offered virtual UC, she declined.    Summary: cough, fever   Pt called with cough, sore throat, fever.  Has not taken a covid test///no appt until Friday  [id number]        Reason for Disposition  [1] Coughed up blood-tinged sputum AND [2] more than once  Answer Assessment - Initial Assessment Questions 1. ONSET: "When did the cough begin?"      Thursday  2. SEVERITY: "How bad is the cough today?"      5 3. SPUTUM: "Describe the color of your sputum" (none, dry cough; clear, white, yellow, green)     Greenish-yellow 4. HEMOPTYSIS: "Are you coughing up any blood?" If so ask: "How much?" (flecks, streaks, tablespoons, etc.)     Flecks of blood 5. DIFFICULTY BREATHING: "Are you having difficulty breathing?" If Yes, ask: "How bad is it?" (e.g., mild, moderate, severe)    - MILD: No SOB at rest, mild SOB with walking, speaks normally in sentences, can lie down, no retractions, pulse < 100.    - MODERATE: SOB at rest, SOB with minimal exertion and prefers to sit, cannot lie down flat, speaks in phrases, mild retractions, audible wheezing, pulse 100-120.    - SEVERE: Very SOB at rest, speaks in single words, struggling to breathe, sitting hunched forward, retractions, pulse > 120      No 6. FEVER: "Do you have a fever?" If Yes, ask: "What is your temperature, how was it measured, and when did it start?"     Yes  100.4 7. OTHER SYMPTOMS: "Do you have any other symptoms?" (e.g., runny nose, wheezing, chest pain)      Headache, Sore throat, fever 100.3, sinus congestion, wheezing, pressure in ears  Protocols used: Cough - Acute Productive-A-AH

## 2022-08-08 DIAGNOSIS — R059 Cough, unspecified: Secondary | ICD-10-CM | POA: Diagnosis not present

## 2022-08-08 DIAGNOSIS — U071 COVID-19: Secondary | ICD-10-CM | POA: Diagnosis not present

## 2022-08-08 DIAGNOSIS — J029 Acute pharyngitis, unspecified: Secondary | ICD-10-CM | POA: Diagnosis not present

## 2022-08-13 DIAGNOSIS — R7309 Other abnormal glucose: Secondary | ICD-10-CM | POA: Diagnosis not present

## 2022-08-15 ENCOUNTER — Other Ambulatory Visit: Payer: Self-pay | Admitting: Family Medicine

## 2022-08-15 NOTE — Telephone Encounter (Signed)
Unable to refill per protocol, Rx expired. Medication discontinued 05/31/22, completed course. Will refuse.  Requested Prescriptions  Pending Prescriptions Disp Refills   escitalopram (LEXAPRO) 20 MG tablet [Pharmacy Med Name: ESCITALOPRAM TABS 20MG ] 90 tablet 3    Sig: TAKE 1 TABLET DAILY     Psychiatry:  Antidepressants - SSRI Passed - 08/15/2022  1:27 AM      Passed - Completed PHQ-2 or PHQ-9 in the last 360 days      Passed - Valid encounter within last 6 months    Recent Outpatient Visits           1 month ago Upper respiratory tract infection, unspecified type   [facility], Erin E, PA-C   2 months ago Other specified hypothyroidism   [facility], Megan P, DO   5 months ago Attention deficit hyperactivity disorder (ADHD), predominantly hyperactive type   [facility], Megan P, DO   6 months ago Concussion with unknown loss of consciousness status, subsequent encounter   [facility], PA-C   8 months ago Routine general medical examination at a health care facility   [facility], [place], DO       Future Appointments             In 1 month Wynetta Emery, Barb Merino, DO [employer], [place]

## 2022-08-23 DIAGNOSIS — F413 Other mixed anxiety disorders: Secondary | ICD-10-CM | POA: Diagnosis not present

## 2022-08-23 DIAGNOSIS — F422 Mixed obsessional thoughts and acts: Secondary | ICD-10-CM | POA: Diagnosis not present

## 2022-08-23 DIAGNOSIS — S060X1A Concussion with loss of consciousness of 30 minutes or less, initial encounter: Secondary | ICD-10-CM | POA: Diagnosis not present

## 2022-09-13 DIAGNOSIS — R7309 Other abnormal glucose: Secondary | ICD-10-CM | POA: Diagnosis not present

## 2022-09-14 ENCOUNTER — Ambulatory Visit: Payer: BC Managed Care – PPO | Admitting: Family Medicine

## 2022-09-18 DIAGNOSIS — G43101 Migraine with aura, not intractable, with status migrainosus: Secondary | ICD-10-CM | POA: Diagnosis not present

## 2022-09-18 DIAGNOSIS — S060X1A Concussion with loss of consciousness of 30 minutes or less, initial encounter: Secondary | ICD-10-CM | POA: Diagnosis not present

## 2022-09-18 DIAGNOSIS — F5102 Adjustment insomnia: Secondary | ICD-10-CM | POA: Diagnosis not present

## 2022-09-18 DIAGNOSIS — F419 Anxiety disorder, unspecified: Secondary | ICD-10-CM | POA: Diagnosis not present

## 2022-10-09 DIAGNOSIS — G4700 Insomnia, unspecified: Secondary | ICD-10-CM | POA: Diagnosis not present

## 2022-10-09 DIAGNOSIS — G44309 Post-traumatic headache, unspecified, not intractable: Secondary | ICD-10-CM | POA: Diagnosis not present

## 2022-10-09 DIAGNOSIS — G43709 Chronic migraine without aura, not intractable, without status migrainosus: Secondary | ICD-10-CM | POA: Diagnosis not present

## 2022-10-12 ENCOUNTER — Ambulatory Visit (INDEPENDENT_AMBULATORY_CARE_PROVIDER_SITE_OTHER): Payer: BC Managed Care – PPO | Admitting: Family Medicine

## 2022-10-12 ENCOUNTER — Encounter: Payer: Self-pay | Admitting: Family Medicine

## 2022-10-12 VITALS — BP 123/84 | HR 84 | Temp 98.4°F | Ht 60.24 in | Wt 184.4 lb

## 2022-10-12 DIAGNOSIS — R159 Full incontinence of feces: Secondary | ICD-10-CM

## 2022-10-12 DIAGNOSIS — F419 Anxiety disorder, unspecified: Secondary | ICD-10-CM | POA: Diagnosis not present

## 2022-10-12 DIAGNOSIS — F32A Depression, unspecified: Secondary | ICD-10-CM

## 2022-10-12 DIAGNOSIS — Z87891 Personal history of nicotine dependence: Secondary | ICD-10-CM

## 2022-10-12 DIAGNOSIS — Z720 Tobacco use: Secondary | ICD-10-CM

## 2022-10-12 DIAGNOSIS — F901 Attention-deficit hyperactivity disorder, predominantly hyperactive type: Secondary | ICD-10-CM | POA: Diagnosis not present

## 2022-10-12 DIAGNOSIS — R7309 Other abnormal glucose: Secondary | ICD-10-CM | POA: Diagnosis not present

## 2022-10-12 MED ORDER — BUPROPION HCL ER (XL) 150 MG PO TB24: ORAL_TABLET | ORAL | 1 refills | Status: DC

## 2022-10-12 MED ORDER — METOPROLOL SUCCINATE ER 25 MG PO TB24: ORAL_TABLET | ORAL | 1 refills | Status: DC

## 2022-10-12 MED ORDER — MELOXICAM 15 MG PO TABS: 15.0000 mg | ORAL_TABLET | Freq: Every day | ORAL | 3 refills | Status: DC

## 2022-10-12 MED ORDER — OMEPRAZOLE 20 MG PO CPDR: DELAYED_RELEASE_CAPSULE | ORAL | 1 refills | Status: DC

## 2022-10-12 MED ORDER — ATOMOXETINE HCL 40 MG PO CAPS: 40.0000 mg | ORAL_CAPSULE | Freq: Two times a day (BID) | ORAL | 1 refills | Status: DC

## 2022-10-12 MED ORDER — LISINOPRIL 10 MG PO TABS: 10.0000 mg | ORAL_TABLET | Freq: Every day | ORAL | 1 refills | Status: DC

## 2022-10-12 MED ORDER — DICYCLOMINE HCL 10 MG PO CAPS: 10.0000 mg | ORAL_CAPSULE | Freq: Three times a day (TID) | ORAL | 1 refills | Status: DC | PRN

## 2022-10-12 NOTE — Progress Notes (Unsigned)
BP 123/84   Pulse 84   Temp 98.4 F (36.9 C) (Oral)   Ht 5' 0.24" (1.53 m)   Wt 184 lb 6.4 oz (83.6 kg)   SpO2 97%   BMI 35.73 kg/m    Subjective:    Patient ID: Pamela Avila, female    DOB: 05-03-1971, 52 y.o.   MRN: [id number]  HPI: Pamela Avila is a 52 y.o. female  Chief Complaint  Patient presents with  . ADHD   ABDOMINAL ISSUES- Has been having some issues with stool incontinence. She notes that it will run out when she is sleeping. 3-4x a year for a couple of years. Goes back and forth with diarrhea and constipation.  Duration: {Blank single:19197::"chronic","days","weeks","months"} Nature: {Blank multiple:19196::"bloating","sharp","dull","aching","burning","cramping","ill-defined","itchy","pressure-like","pulling","shooting","sore","stabbing","tender","tearing","throbbing"} Location: {Blank multiple:19196::"diffuse","vague","LUQ","RUQ","epigastric","peri-umbilical","LLQ","RLQ","diffuse","suprapubic". "lower abdominal quadrants"}  Severity: {Blank single:19197::"mild","moderate","severe","1/10","2/10","3/10","4/10","5/10","6/10","7/10","8/10","9/10","10/10"}  Radiation: {Blank single:19197::"yes","no"} Episode duration: Frequency: {Blank single:19197::"constant","intermittent","occasional","rare","every few minutes","a few times a hour","a few times a day","a few times a week","a few times a month","a few times a year"} Alleviating factors: Aggravating factors: Treatments attempted: {Blank multiple:19196::"none","antacids","PPI","H2 Blocker","laxatives"} Constipation: {Blank single:19197::"yes","no","intermittent"} Diarrhea: {Blank single:19197::"yes","no"} Episodes of diarrhea/day: Mucous in the stool: {Blank single:19197::"yes","no"} Heartburn: {Blank single:19197::"yes","no"} Bloating:{Blank single:19197::"yes","no"} Flatulence: {Blank single:19197::"yes","no"} Nausea: {Blank single:19197::"yes","no"} Vomiting: {Blank single:19197::"yes","no"} Episodes of  vomit/day: Melena or hematochezia: {Blank single:19197::"yes","no"} Rash: {Blank single:19197::"yes","no"} Jaundice: {Blank single:19197::"yes","no"} Fever: {Blank single:19197::"yes","no"} Weight loss: {Blank single:19197::"yes","no"}  ADHD FOLLOW UP ADHD status: {Blank single:19197::"controlled","uncontrolled","better","worse","exacerbated","stable"} Satisfied with current therapy: {Blank single:19197::"yes","no"} Medication compliance:  {Blank single:19197::"excellent compliance","good compliance","fair compliance","poor compliance"} Controlled substance contract: {Blank single:19197::"yes","no"} Previous psychiatry evaluation: {Blank single:19197::"yes","no"} Previous medications: {Blank single:19197::"yes","no"} {Blank [id number] XR","concerta","daytrana (methylphenidate)","focalin (dexamethylphenidate)". "ritalin","ritalin LA","ritalin SR","stratera (atomoxetine)","vyvanse (lisdexamfethamine)","wellbutrin"}   Taking meds on weekends/vacations: {Blank single:19197::"yes","no","occasionally"} Work/school performance:  {Blank single:19197::"excellent","good","average","fair","poor"} Difficulty sustaining attention/completing tasks: {Blank single:19197::"yes","no"} Distracted by extraneous stimuli: {Blank single:19197::"yes","no"} Does not listen when spoken to: {Blank single:19197::"yes","no"}  Fidgets with hands or feet: {Blank single:19197::"yes","no"} Unable to stay in seat: {Blank single:19197::"yes","no"} Blurts out/interrupts others: {Blank single:19197::"yes","no"} ADHD Medication Side Effects: {Blank single:19197::"yes","no"}    Decreased appetite: {Blank single:19197::"yes","no"}    Headache: {Blank single:19197::"yes","no"}    Sleeping disturbance pattern: {Blank single:19197::"yes","no"}    Irritability: {Blank single:19197::"yes","no"}    Rebound effects (worse than baseline) off medication: {Blank single:19197::"yes","no"}    Anxiousness: {Blank  single:19197::"yes","no"}    Dizziness: {Blank single:19197::"yes","no"}    Tics: {Blank single:19197::"yes","no"}  ANXIETY/STRESS Duration:{Blank single:19197::"controlled","uncontrolled","better","worse","exacerbated","stable"} Anxious mood: {Blank single:19197::"yes","no"}  Excessive worrying: {Blank single:19197::"yes","no"} Irritability: {Blank single:19197::"yes","no"}  Sweating: {Blank single:19197::"yes","no"} Nausea: {Blank single:19197::"yes","no"} Palpitations:{Blank single:19197::"yes","no"} Hyperventilation: {Blank single:19197::"yes","no"} Panic attacks: {Blank single:19197::"yes","no"} Agoraphobia: {Blank single:19197::"yes","no"}  Obscessions/compulsions: {Blank single:19197::"yes","no"} Depressed mood: {Blank single:19197::"yes","no"}    06/20/2022    9:07 AM 05/31/2022    3:38 PM 03/12/2022    2:30 PM 02/14/2022    9:27 AM 12/05/2021    4:19 PM  Depression screen PHQ 2/9  Decreased Interest 1 2 0 2 1  Down, Depressed, Hopeless 0 1 1 0 1  PHQ - 2 Score '1 3 1 2 2  '$ Altered sleeping '3 2 3 3 1  '$ Tired, decreased energy '1 2 3 3 1  '$ Change in appetite 0 0 0 3 1  Feeling bad or failure about yourself  0 0 0 0 1  Trouble concentrating '3 3 3 3 '$ 0  Moving slowly or fidgety/restless 0 3 0 3 0  Suicidal thoughts 0 0 0 0 0  PHQ-9 Score '8 13 10 17 6  '$ Difficult doing work/chores Not difficult at all Extremely dIfficult  Somewhat difficult    Anhedonia: {Blank single:19197::"yes","no"} Weight changes: {Blank single:19197::"yes","no"} Insomnia: {  Blank single:19197::"yes","no"} {Blank single:19197::"hard to fall asleep","hard to stay asleep"}  Hypersomnia: {Blank single:19197::"yes","no"} Fatigue/loss of energy: {Blank single:19197::"yes","no"} Feelings of worthlessness: {Blank single:19197::"yes","no"} Feelings of guilt: {Blank single:19197::"yes","no"} Impaired concentration/indecisiveness: {Blank single:19197::"yes","no"} Suicidal ideations: {Blank single:19197::"yes","no"}   Crying spells: {Blank single:19197::"yes","no"} Recent Stressors/Life Changes: {Blank single:19197::"yes","no"}   Relationship problems: {Blank single:19197::"yes","no"}   Family stress: {Blank single:19197::"yes","no"}     Financial stress: {Blank single:19197::"yes","no"}    Job stress: {Blank single:19197::"yes","no"}    Recent death/loss: {Blank single:19197::"yes","no"}  Relevant past medical, surgical, family and social history reviewed and updated as indicated. Interim medical history since our last visit reviewed. Allergies and medications reviewed and updated.  Review of Systems  Per HPI unless specifically indicated above     Objective:    BP 123/84   Pulse 84   Temp 98.4 F (36.9 C) (Oral)   Ht 5' 0.24" (1.53 m)   Wt 184 lb 6.4 oz (83.6 kg)   SpO2 97%   BMI 35.73 kg/m   Wt Readings from Last 3 Encounters:  10/12/22 184 lb 6.4 oz (83.6 kg)  06/20/22 186 lb 9.6 oz (84.6 kg)  05/31/22 183 lb 11.2 oz (83.3 kg)    Physical Exam  Results for orders placed or performed in visit on 06/20/22  Rapid Strep screen([facility])   Specimen: Other   Other  Result Value Ref Range   Strep Gp A Ag, IA W/Reflex Negative Negative  Novel Coronavirus, NAA ([facility])   Specimen: Nasopharyngeal(NP) swabs in vial transport medium  Result Value Ref Range   SARS-CoV-2, NAA Not Detected Not Detected  Culture, Group A Strep   Other  Result Value Ref Range   Strep A Culture Comment (A)   Veritor Flu A/B Waived  Result Value Ref Range   Influenza A Negative Negative   Influenza B Negative Negative      Assessment & Plan:   Problem List Items Addressed This Visit   None Visit Diagnoses     Full incontinence of feces    -  Primary   Relevant Orders   Ambulatory referral to Gastroenterology        Follow up plan: No follow-ups on file.

## 2022-10-16 MED ORDER — AMPHETAMINE-DEXTROAMPHET ER 30 MG PO CP24: 30.0000 mg | ORAL_CAPSULE | ORAL | 0 refills | Status: DC

## 2022-10-16 MED ORDER — AMPHETAMINE-DEXTROAMPHET ER 10 MG PO CP24: 10.0000 mg | ORAL_CAPSULE | Freq: Every day | ORAL | 0 refills | Status: DC

## 2022-10-16 MED ORDER — AMPHETAMINE-DEXTROAMPHETAMINE 5 MG PO TABS: 5.0000 mg | ORAL_TABLET | Freq: Every day | ORAL | 0 refills | Status: DC

## 2022-10-16 MED ORDER — POLYETHYLENE GLYCOL 3350 17 GM/SCOOP PO POWD: 17.0000 g | Freq: Two times a day (BID) | ORAL | 1 refills | Status: DC | PRN

## 2022-10-16 NOTE — Assessment & Plan Note (Signed)
Under good control on current regimen. Continue current regimen. Continue to monitor. Call with any concerns. Refills given.   

## 2022-10-16 NOTE — Assessment & Plan Note (Signed)
Will get low-dose CT scan of lungs. Ordered today.

## 2022-10-16 NOTE — Assessment & Plan Note (Signed)
Under good control on current regimen. Continue current regimen. Continue to monitor. Call with any concerns. Refills given for 3 months. Follow up 3 months.    

## 2022-10-17 ENCOUNTER — Other Ambulatory Visit: Payer: Self-pay | Admitting: Family Medicine

## 2022-10-17 MED ORDER — AMPHETAMINE-DEXTROAMPHETAMINE 5 MG PO TABS: 5.0000 mg | ORAL_TABLET | Freq: Every day | ORAL | 0 refills | Status: DC

## 2022-10-17 MED ORDER — AMPHETAMINE-DEXTROAMPHET ER 30 MG PO CP24: 30.0000 mg | ORAL_CAPSULE | ORAL | 0 refills | Status: DC

## 2022-10-17 MED ORDER — AMPHETAMINE-DEXTROAMPHET ER 10 MG PO CP24: 10.0000 mg | ORAL_CAPSULE | Freq: Every day | ORAL | 0 refills | Status: DC

## 2022-10-23 DIAGNOSIS — S060X1A Concussion with loss of consciousness of 30 minutes or less, initial encounter: Secondary | ICD-10-CM | POA: Diagnosis not present

## 2022-11-12 DIAGNOSIS — R7309 Other abnormal glucose: Secondary | ICD-10-CM | POA: Diagnosis not present

## 2022-11-13 ENCOUNTER — Other Ambulatory Visit: Payer: Self-pay | Admitting: Family Medicine

## 2022-11-13 MED ORDER — AMPHETAMINE-DEXTROAMPHET ER 10 MG PO CP24: 10.0000 mg | ORAL_CAPSULE | Freq: Every day | ORAL | 0 refills | Status: DC

## 2022-11-13 MED ORDER — AMPHETAMINE-DEXTROAMPHETAMINE 5 MG PO TABS: 5.0000 mg | ORAL_TABLET | Freq: Every day | ORAL | 0 refills | Status: DC

## 2022-11-13 MED ORDER — AMPHETAMINE-DEXTROAMPHET ER 30 MG PO CP24: 30.0000 mg | ORAL_CAPSULE | ORAL | 0 refills | Status: DC

## 2022-12-04 DIAGNOSIS — S060X1A Concussion with loss of consciousness of 30 minutes or less, initial encounter: Secondary | ICD-10-CM | POA: Diagnosis not present

## 2022-12-10 ENCOUNTER — Encounter: Payer: BC Managed Care – PPO | Admitting: Family Medicine

## 2022-12-12 DIAGNOSIS — R7309 Other abnormal glucose: Secondary | ICD-10-CM | POA: Diagnosis not present

## 2022-12-17 ENCOUNTER — Telehealth: Payer: Self-pay | Admitting: Family Medicine

## 2022-12-17 NOTE — Telephone Encounter (Signed)
Appt has been made.

## 2022-12-17 NOTE — Telephone Encounter (Unsigned)
Copied from CRM [id number]. Topic: Appointment Scheduling - Scheduling Inquiry for Clinic >> Dec 17, 2022 12:50 PM Marlow Baars wrote: Reason for CRM: The patient called in needing to chang her appt for her physical until the afternoon because her work schedule changed. She needs to know if she still needs to do any fasting labs and if so if she can do them on a different day? Please assist patient further

## 2022-12-17 NOTE — Telephone Encounter (Signed)
We can get her back another day for fasting labs. OK to change this

## 2022-12-18 ENCOUNTER — Other Ambulatory Visit: Payer: BC Managed Care – PPO

## 2022-12-20 ENCOUNTER — Other Ambulatory Visit: Payer: BC Managed Care – PPO

## 2022-12-21 ENCOUNTER — Encounter: Payer: BC Managed Care – PPO | Admitting: Family Medicine

## 2022-12-31 DIAGNOSIS — H518 Other specified disorders of binocular movement: Secondary | ICD-10-CM | POA: Diagnosis not present

## 2023-01-08 ENCOUNTER — Ambulatory Visit (INDEPENDENT_AMBULATORY_CARE_PROVIDER_SITE_OTHER): Payer: BC Managed Care – PPO | Admitting: Family Medicine

## 2023-01-08 ENCOUNTER — Encounter: Payer: Self-pay | Admitting: Family Medicine

## 2023-01-08 ENCOUNTER — Other Ambulatory Visit (HOSPITAL_COMMUNITY)
Admission: RE | Admit: 2023-01-08 | Discharge: 2023-01-08 | Disposition: A | Discharge status: Home / Self Care | Payer: BC Managed Care – PPO | Source: Ambulatory Visit | Attending: Family Medicine | Admitting: Family Medicine

## 2023-01-08 VITALS — BP 122/77 | HR 81 | Temp 98.3°F | Ht 61.0 in | Wt 185.4 lb

## 2023-01-08 DIAGNOSIS — F901 Attention-deficit hyperactivity disorder, predominantly hyperactive type: Secondary | ICD-10-CM

## 2023-01-08 DIAGNOSIS — K219 Gastro-esophageal reflux disease without esophagitis: Secondary | ICD-10-CM | POA: Diagnosis not present

## 2023-01-08 DIAGNOSIS — Z0000 Encounter for general adult medical examination without abnormal findings: Secondary | ICD-10-CM | POA: Insufficient documentation

## 2023-01-08 DIAGNOSIS — F419 Anxiety disorder, unspecified: Secondary | ICD-10-CM

## 2023-01-08 DIAGNOSIS — I10 Essential (primary) hypertension: Secondary | ICD-10-CM | POA: Diagnosis not present

## 2023-01-08 DIAGNOSIS — D1723 Benign lipomatous neoplasm of skin and subcutaneous tissue of right leg: Secondary | ICD-10-CM

## 2023-01-08 DIAGNOSIS — Z833 Family history of diabetes mellitus: Secondary | ICD-10-CM | POA: Diagnosis not present

## 2023-01-08 DIAGNOSIS — F32A Depression, unspecified: Secondary | ICD-10-CM

## 2023-01-08 DIAGNOSIS — E038 Other specified hypothyroidism: Secondary | ICD-10-CM

## 2023-01-08 DIAGNOSIS — L609 Nail disorder, unspecified: Secondary | ICD-10-CM

## 2023-01-08 DIAGNOSIS — Z136 Encounter for screening for cardiovascular disorders: Secondary | ICD-10-CM | POA: Diagnosis not present

## 2023-01-08 LAB — URINALYSIS, ROUTINE W REFLEX MICROSCOPIC
Bilirubin, UA: NEGATIVE
Glucose, UA: NEGATIVE
Ketones, UA: NEGATIVE
Leukocytes,UA: NEGATIVE
Nitrite, UA: NEGATIVE
Protein,UA: NEGATIVE
RBC, UA: NEGATIVE
Specific Gravity, UA: 1.01 (ref 1.005–1.030)
Urobilinogen, Ur: 0.2 mg/dL (ref 0.2–1.0)
pH, UA: 7 (ref 5.0–7.5)

## 2023-01-08 LAB — BAYER DCA HB A1C WAIVED: HB A1C (BAYER DCA - WAIVED): 5.8 % — ABNORMAL HIGH (ref 4.8–5.6)

## 2023-01-08 LAB — MICROALBUMIN, URINE WAIVED
Creatinine, Urine Waived: 10 mg/dL (ref 10–300)
Microalb, Ur Waived: 10 mg/L (ref 0–19)
Microalb/Creat Ratio: <30 mg/g (ref <30)

## 2023-01-08 MED ORDER — LISINOPRIL 10 MG PO TABS: 10.0000 mg | ORAL_TABLET | Freq: Every day | ORAL | 1 refills | Status: DC

## 2023-01-08 MED ORDER — DICYCLOMINE HCL 10 MG PO CAPS: 10.0000 mg | ORAL_CAPSULE | Freq: Three times a day (TID) | ORAL | 1 refills | Status: DC | PRN

## 2023-01-08 MED ORDER — OMEPRAZOLE 20 MG PO CPDR: DELAYED_RELEASE_CAPSULE | ORAL | 1 refills | Status: DC

## 2023-01-08 MED ORDER — METOPROLOL SUCCINATE ER 25 MG PO TB24: ORAL_TABLET | ORAL | 1 refills | Status: DC

## 2023-01-08 NOTE — Progress Notes (Signed)
BP 122/77   Pulse 81   Temp 98.3 F (36.8 C) (Oral)   Ht 5\' 1"  (1.549 m)   Wt 185 lb 6.4 oz (84.1 kg)   SpO2 98%   BMI 35.03 kg/m    Subjective:    Patient ID: Pamela Avila, female    DOB: [birth year], 52 y.o.   MRN: [id number]  HPI: Pamela Avila is a 52 y.o. female presenting on 01/08/2023 for comprehensive medical examination. Current medical complaints include:  HYPERTENSION  Hypertension status: {Blank single:19197::"controlled","uncontrolled","better","worse","exacerbated","stable"}  Satisfied with current treatment? {Blank single:19197::"yes","no"} Duration of hypertension: {Blank single:19197::"chronic","months","years"} BP monitoring frequency:  {Blank single:19197::"not checking","rarely","daily","weekly","monthly","a few times a day","a few times a week","a few times a month"} BP medication side effects:  {Blank single:19197::"yes","no"} Medication compliance: {Blank single:19197::"excellent compliance","good compliance","fair compliance","poor compliance"} Previous BP meds:{Blank multiple:19196::"none","amlodipine","amlodipine/benazepril","atenolol","benazepril","benazepril/HCTZ","bisoprolol (bystolic)","carvedilol","chlorthalidone","clonidine","diltiazem","exforge HCT","HCTZ","irbesartan (avapro)","labetalol","lisinopril","lisinopril-HCTZ","losartan (cozaar)","methyldopa","nifedipine","olmesartan (benicar)","olmesartan-HCTZ","quinapril","ramipril","spironalactone","tekturna","valsartan","valsartan-HCTZ","verapamil"} Aspirin: {Blank single:19197::"yes","no"} Recurrent headaches: {Blank single:19197::"yes","no"} Visual changes: {Blank single:19197::"yes","no"} Palpitations: {Blank single:19197::"yes","no"} Dyspnea: {Blank single:19197::"yes","no"} Chest pain: {Blank single:19197::"yes","no"} Lower extremity edema: {Blank single:19197::"yes","no"} Dizzy/lightheaded: {Blank single:19197::"yes","no"}  HYPOTHYROIDISM Thyroid control status:{Blank  single:19197::"controlled","uncontrolled","better","worse","exacerbated","stable"} Satisfied with current treatment? {Blank single:19197::"yes","no"} Medication side effects: {Blank single:19197::"yes","no"} Medication compliance: {Blank single:19197::"excellent compliance","good compliance","fair compliance","poor compliance"} Recent dose adjustment:no Fatigue: no Cold intolerance: no Heat intolerance: yes Weight gain: no Weight loss: no Constipation: yes Diarrhea/loose stools: yes Palpitations: {Blank single:19197::"yes","no"} Lower extremity edema: {Blank single:19197::"yes","no"} Anxiety/depressed mood: {Blank single:19197::"yes","no"}  ANXIETY/DEPRESSION Duration:{Blank single:19197::"controlled","uncontrolled","better","worse","exacerbated","stable"} Anxious mood: {Blank single:19197::"yes","no"}  Excessive worrying: {Blank single:19197::"yes","no"} Irritability: {Blank single:19197::"yes","no"}  Sweating: {Blank single:19197::"yes","no"} Nausea: {Blank single:19197::"yes","no"} Palpitations:{Blank single:19197::"yes","no"} Hyperventilation: {Blank single:19197::"yes","no"} Panic attacks: {Blank single:19197::"yes","no"} Agoraphobia: {Blank single:19197::"yes","no"}  Obscessions/compulsions: {Blank single:19197::"yes","no"} Depressed mood: {Blank single:19197::"yes","no"}    01/08/2023    4:23 PM 06/20/2022    9:07 AM 05/31/2022    3:38 PM 03/12/2022    2:30 PM 02/14/2022    9:27 AM  Depression screen PHQ 2/9  Decreased Interest 0 1 2 0 2  Down, Depressed, Hopeless 1 0 1 1 0  PHQ - 2 Score 1 1 3 1 2   Altered sleeping 1 3 2 3 3   Tired, decreased energy 1 1 2 3 3   Change in appetite 0 0 0 0 3  Feeling bad or failure about yourself  0 0 0 0 0  Trouble concentrating 3 3 3 3 3   Moving slowly or fidgety/restless 0 0 3 0 3  Suicidal thoughts 0 0 0 0 0  PHQ-9 Score 6 8 13 10 17   Difficult doing work/chores Extremely dIfficult Not difficult at all Extremely dIfficult  Somewhat  difficult   Anhedonia: {Blank single:19197::"yes","no"} Weight changes: {Blank single:19197::"yes","no"} Insomnia: {Blank single:19197::"yes","no"} {Blank single:19197::"hard to fall asleep","hard to stay asleep"}  Hypersomnia: {Blank single:19197::"yes","no"} Fatigue/loss of energy: {Blank single:19197::"yes","no"} Feelings of worthlessness: {Blank single:19197::"yes","no"} Feelings of guilt: {Blank single:19197::"yes","no"} Impaired concentration/indecisiveness: {Blank single:19197::"yes","no"} Suicidal ideations: {Blank single:19197::"yes","no"}  Crying spells: {Blank single:19197::"yes","no"} Recent Stressors/Life Changes: {Blank single:19197::"yes","no"}   Relationship problems: {Blank single:19197::"yes","no"}   Family stress: {Blank single:19197::"yes","no"}     Financial stress: {Blank single:19197::"yes","no"}    Job stress: {Blank single:19197::"yes","no"}    Recent death/loss: {Blank single:19197::"yes","no"}  ADHD FOLLOW UP ADHD status: {Blank single:19197::"controlled","uncontrolled","better","worse","exacerbated","stable"} Satisfied with current therapy: {Blank single:19197::"yes","no"} Medication compliance:  {Blank single:19197::"excellent compliance","good compliance","fair compliance","poor compliance"} Controlled substance contract: {Blank single:19197::"yes","no"} Previous psychiatry evaluation: {Blank single:19197::"yes","no"} Previous medications: {Blank single:19197::"yes","no"} {Blank multiple:19196::"adderall","adderall XR","concerta","daytrana (methylphenidate)","focalin (dexamethylphenidate)". "ritalin","ritalin LA","ritalin SR","stratera (atomoxetine)","vyvanse (lisdexamfethamine)","wellbutrin"}   Taking meds on weekends/vacations: {Blank single:19197::"yes","no","occasionally"} Work/school performance:  {Blank single:19197::"excellent","good","average","fair","poor"} Difficulty sustaining attention/completing tasks: {Blank single:19197::"yes","no"} Distracted  by extraneous stimuli: {Blank single:19197::"yes","no"} Does not listen  when spoken to: {Blank single:19197::"yes","no"}  Fidgets with hands or feet: {Blank single:19197::"yes","no"} Unable to stay in seat: {Blank single:19197::"yes","no"} Blurts out/interrupts others: {Blank single:19197::"yes","no"} ADHD Medication Side Effects: {Blank single:19197::"yes","no"}    Decreased appetite: {Blank single:19197::"yes","no"}    Headache: {Blank single:19197::"yes","no"}    Sleeping disturbance pattern: {Blank single:19197::"yes","no"}    Irritability: {Blank single:19197::"yes","no"}    Rebound effects (worse than baseline) off medication: {Blank single:19197::"yes","no"}    Anxiousness: {Blank single:19197::"yes","no"}    Dizziness: {Blank single:19197::"yes","no"}    Tics: {Blank single:19197::"yes","no"}  Menopausal Symptoms: no  Depression Screen done today and results listed below:     01/08/2023    4:23 PM 06/20/2022    9:07 AM 05/31/2022    3:38 PM 03/12/2022    2:30 PM 02/14/2022    9:27 AM  Depression screen PHQ 2/9  Decreased Interest 0 1 2 0 2  Down, Depressed, Hopeless 1 0 1 1 0  PHQ - 2 Score 1 1 3 1 2   Altered sleeping 1 3 2 3 3   Tired, decreased energy 1 1 2 3 3   Change in appetite 0 0 0 0 3  Feeling bad or failure about yourself  0 0 0 0 0  Trouble concentrating 3 3 3 3 3   Moving slowly or fidgety/restless 0 0 3 0 3  Suicidal thoughts 0 0 0 0 0  PHQ-9 Score 6 8 13 10 17   Difficult doing work/chores Extremely dIfficult Not difficult at all Extremely dIfficult  Somewhat difficult    Past Medical History:  Past Medical History:  Diagnosis Date  . ADHD (attention deficit hyperactivity disorder)   . Anemia   . Anxiety   . Anxiety disorder   . Complication of anesthesia   . Depression   . Family history of adverse reaction to anesthesia   . Family history of diabetes mellitus   . GERD (gastroesophageal reflux disease)   . HSV (herpes simplex virus) infection   .  Hypertension   . Hypothyroidism   . IBS (irritable bowel syndrome)   . Lab test positive for detection of COVID-19 virus   . PONV (postoperative nausea and vomiting)   . Sleep apnea   . Thyroid disease    hypothroidsim    Surgical History:  Past Surgical History:  Procedure Laterality Date  . BREAST EXCISIONAL BIOPSY Right 2010   neg surgical breast bx    . BREAST SURGERY Right    lumpectomy  . CESAREAN SECTION     x 2  . COLONOSCOPY WITH PROPOFOL N/A 12/28/2016   Procedure: COLONOSCOPY WITH PROPOFOL;  Surgeon: Wyline Mood, MD;  Location: [facility] ENDOSCOPY;  Service: Endoscopy;  Laterality: N/A;  . CRYOTHERAPY  20 + years ago   cervix  . DILITATION & CURRETTAGE/HYSTROSCOPY WITH NOVASURE ABLATION N/A 04/11/2015   Procedure: DILATATION & CURETTAGE/HYSTEROSCOPY WITH NOVASURE ABLATION;  Surgeon: Herold Harms, MD;  Location: [facility] ORS;  Service: Gynecology;  Laterality: N/A;  . ESOPHAGOGASTRODUODENOSCOPY (EGD) WITH PROPOFOL N/A 12/28/2016   Procedure: ESOPHAGOGASTRODUODENOSCOPY (EGD) WITH PROPOFOL;  Surgeon: Wyline Mood, MD;  Location: [facility] ENDOSCOPY;  Service: Endoscopy;  Laterality: N/A;  . ESSURE TUBAL LIGATION    . lumpectomy Right   . NASAL SEPTOPLASTY W/ TURBINOPLASTY Bilateral 10/07/2018   Procedure: SEPTOPLASTY WITH BILATERAL SUBMUCOUS RESECTION OF INFERIOR TURBINATES;  Surgeon: Linus Salmons, MD;  Location: [facility] ORS;  Service: ENT;  Laterality: Bilateral;  . TONSILLECTOMY    . TUBAL LIGATION      Medications:  Current Outpatient Medications on File Prior to  Visit  Medication Sig  . amphetamine-dextroamphetamine (ADDERALL XR) 10 MG 24 hr capsule Take 1 capsule (10 mg total) by mouth daily. To be taken with 30mg  XR for 40mg  total  . amphetamine-dextroamphetamine (ADDERALL XR) 30 MG 24 hr capsule Take 1 capsule (30 mg total) by mouth every morning. To be taken with 10mg  XR for 40mg  total  . amphetamine-dextroamphetamine (ADDERALL) 5 MG tablet Take 1 tablet (5 mg total) by  mouth daily.  [name] buPROPion (WELLBUTRIN XL) 150 MG 24 hr tablet TAKE 1 TABLET BY MOUTH EVERY DAY GENERIC EQUIVALENT FOR WELLBUTRIN XL  . busPIRone (BUSPAR) 5 MG tablet Take 5 mg by mouth 2 (two) times daily.  . clonazePAM (KLONOPIN) 0.5 MG tablet Take 0.5 mg by mouth daily.  [name] escitalopram (LEXAPRO) 20 MG tablet Take 20 mg by mouth daily.  [name] levothyroxine (SYNTHROID) 112 MCG tablet Take 1 tablet (112 mcg total) by mouth daily.  . Magnesium 250 MG TABS Take 3 tablets by mouth daily at 6 (six) AM.  . meclizine (ANTIVERT) 25 MG tablet Take 1 tablet (25 mg total) by mouth 3 (three) times daily as needed for dizziness.  . meloxicam (MOBIC) 15 MG tablet Take 1 tablet (15 mg total) by mouth daily.  . naratriptan (AMERGE) 2.5 MG tablet Take 1 tab at onset of migraine. May repeat in 2 hours if needed. Max 2/day and 2-3 days/wk.  . nortriptyline (PAMELOR) 10 MG capsule Take by mouth 2 (two) times daily.  . ondansetron (ZOFRAN-ODT) 4 MG disintegrating tablet Take 4 mg by mouth every 8 (eight) hours as needed.  . polyethylene glycol powder (GLYCOLAX/MIRALAX) 17 GM/SCOOP powder Take 17 g by mouth 2 (two) times daily as needed.  . QULIPTA 60 MG TABS Take 1 tablet by mouth daily.   No current facility-administered medications on file prior to visit.    Allergies:  Allergies  Allergen Reactions  . Erythromycin Itching  . Bactrim [Sulfamethoxazole-Trimethoprim] Rash    Social History:  Social History   Socioeconomic History  . Marital status: Significant Other    Spouse name: Not on file  . Number of children: Not on file  . Years of education: Not on file  . Highest education level: Not on file  Occupational History  . Not on file  Tobacco Use  . Smoking status: Former    Types: Cigarettes  . Smokeless tobacco: Never  . Tobacco comments:    occasional  Vaping Use  . Vaping Use: Former  Substance and Sexual Activity  . Alcohol use: Yes    Alcohol/week: 12.0 standard drinks of alcohol     Types: 12 Standard drinks or equivalent per week  . Drug use: Not Currently    Types: Marijuana    Comment: Occasional, hx of cocaine in past  . Sexual activity: Yes  Other Topics Concern  . Not on file  Social History Narrative  . Not on file   Social Determinants of Health   Financial Resource Strain: Not on file  Food Insecurity: Not on file  Transportation Needs: Not on file  Physical Activity: Not on file  Stress: Not on file  Social Connections: Not on file  Intimate Partner Violence: Not on file   Social History   Tobacco Use  Smoking Status Former  . Types: Cigarettes  Smokeless Tobacco Never  Tobacco Comments   occasional   Social History   Substance and Sexual Activity  Alcohol Use Yes  . Alcohol/week: 12.0 standard drinks of alcohol  .  Types: 12 Standard drinks or equivalent per week    Family History:  Family History  Problem Relation Age of Onset  . Hypothyroidism Paternal Grandfather   . Breast cancer Paternal Grandmother   . Cancer Paternal Grandmother        breast  . Breast cancer Maternal Grandmother   . Hyperlipidemia Maternal Grandfather   . Diabetes Father   . Obesity Father   . Hypertension Father   . Hyperlipidemia Father   . COPD Father   . Ovarian cancer Paternal Aunt        ?  [name] Cancer Paternal Aunt        breast and cervical  . Breast cancer Paternal Aunt   . Hypothyroidism Mother   . Breast cancer Son   . Cancer Son        breast  . Colon cancer Neg Hx     Past medical history, surgical history, medications, allergies, family history and social history reviewed with patient today and changes made to appropriate areas of the chart.   Review of Systems  Constitutional: Negative.   HENT: Negative.    Eyes:  Positive for blurred vision. Negative for double vision, photophobia, pain, discharge and redness.  Respiratory: Negative.    Cardiovascular:  Positive for chest pain (very occasionally- can't tell what causes it or  when it happens) and leg swelling. Negative for palpitations, orthopnea, claudication and PND.  Gastrointestinal:  Positive for constipation and diarrhea. Negative for abdominal pain, blood in stool, heartburn, melena, nausea and vomiting.  Genitourinary: Negative.   Musculoskeletal: Negative.   Skin: Negative.   Neurological:  Positive for dizziness. Negative for tingling, tremors, sensory change, speech change, focal weakness, seizures, loss of consciousness, weakness and headaches.  Endo/Heme/Allergies:  Positive for polydipsia. Negative for environmental allergies. Bruises/bleeds easily.  Psychiatric/Behavioral:  Positive for memory loss. Negative for depression, hallucinations, substance abuse and suicidal ideas. The patient is not nervous/anxious and does not have insomnia.    All other ROS negative except what is listed above and in the HPI.      Objective:    BP 122/77   Pulse 81   Temp 98.3 F (36.8 C) (Oral)   Ht 5\' 1"  (1.549 m)   Wt 185 lb 6.4 oz (84.1 kg)   SpO2 98%   BMI 35.03 kg/m   Wt Readings from Last 3 Encounters:  01/08/23 185 lb 6.4 oz (84.1 kg)  10/12/22 184 lb 6.4 oz (83.6 kg)  06/20/22 186 lb 9.6 oz (84.6 kg)    Physical Exam  Results for orders placed or performed in visit on 01/08/23  Urinalysis, Routine w reflex microscopic  Result Value Ref Range   Specific Gravity, UA 1.010 1.005 - 1.030   pH, UA 7.0 5.0 - 7.5   Color, UA Yellow Yellow   Appearance Ur Clear Clear   Leukocytes,UA Negative Negative   Protein,UA Negative Negative/Trace   Glucose, UA Negative Negative   Ketones, UA Negative Negative   RBC, UA Negative Negative   Bilirubin, UA Negative Negative   Urobilinogen, Ur 0.2 0.2 - 1.0 mg/dL   Nitrite, UA Negative Negative   Microscopic Examination Comment   Bayer DCA Hb A1c Waived  Result Value Ref Range   HB A1C (BAYER DCA - WAIVED) 5.8 (H) 4.8 - 5.6 %  Microalbumin, Urine Waived  Result Value Ref Range   Microalb, Ur Waived 10 0 -  19 mg/L   Creatinine, Urine Waived 10 10 - 300 mg/dL  Microalb/Creat Ratio <30 <30 mg/g      Assessment & Plan:   Problem List Items Addressed This Visit       Cardiovascular and Mediastinum   Essential hypertension, benign   Relevant Medications   lisinopril (ZESTRIL) 10 MG tablet   metoprolol succinate (TOPROL-XL) 25 MG 24 hr tablet   Other Relevant Orders   Microalbumin, Urine Waived (Completed)     Digestive   GERD (gastroesophageal reflux disease)   Relevant Medications   dicyclomine (BENTYL) 10 MG capsule   omeprazole (PRILOSEC) 20 MG capsule     Endocrine   Hypothyroidism   Relevant Medications   metoprolol succinate (TOPROL-XL) 25 MG 24 hr tablet     Other   ADHD (attention deficit hyperactivity disorder)   Anxiety and depression   Relevant Medications   busPIRone (BUSPAR) 5 MG tablet   escitalopram (LEXAPRO) 20 MG tablet   Family history of diabetes mellitus   Relevant Orders   Bayer DCA Hb A1c Waived (Completed)   Other Visit Diagnoses     Routine general medical examination at a health care facility    -  Primary   Relevant Orders   CBC with Differential/Platelet   Comprehensive metabolic panel   Lipid Panel w/o Chol/HDL Ratio   Cytology - PAP   Urinalysis, Routine w reflex microscopic (Completed)   TSH   Bayer DCA Hb A1c Waived (Completed)   Lipoma of right lower extremity       Relevant Orders   Ambulatory referral to General Surgery   Nail problem       Relevant Orders   Ambulatory referral to Dermatology        Follow up plan: Return as able for mole removal, 6 months for follow up.   LABORATORY TESTING:  - Pap smear: pap done  IMMUNIZATIONS:   - Tdap: Tetanus vaccination status reviewed: last tetanus booster within 10 years. - Influenza: {Blank single:19197::"Up to date","Administered today","Postponed to flu season","Refused","Given elsewhere"} - Pneumovax: {Blank single:19197::"Up to date","Administered today","Not  applicable","Refused","Given elsewhere"} - Prevnar: {Blank single:19197::"Up to date","Administered today","Not applicable","Refused","Given elsewhere"} - COVID: {Blank single:19197::"Up to date","Administered today","Not applicable","Refused","Given elsewhere"} - HPV: {Blank single:19197::"Up to date","Administered today","Not applicable","Refused","Given elsewhere"} - Shingrix vaccine: {Blank single:19197::"Up to date","Administered today","Not applicable","Refused","Given elsewhere"}  SCREENING: -Mammogram: Up to date  - Colonoscopy: Up to date   PATIENT COUNSELING:   Advised to take 1 mg of folate supplement per day if capable of pregnancy.   Sexuality: Discussed sexually transmitted diseases, partner selection, use of condoms, avoidance of unintended pregnancy  and contraceptive alternatives.   Advised to avoid cigarette smoking.  I discussed with the patient that most people either abstain from alcohol or drink within safe limits (<=14/week and <=4 drinks/occasion for males, <=7/weeks and <= 3 drinks/occasion for females) and that the risk for alcohol disorders and other health effects rises proportionally with the number of drinks per week and how often a drinker exceeds daily limits.  Discussed cessation/primary prevention of drug use and availability of treatment for abuse.   Diet: Encouraged to adjust caloric intake to maintain  or achieve ideal body weight, to reduce intake of dietary saturated fat and total fat, to limit sodium intake by avoiding high sodium foods and not adding table salt, and to maintain adequate dietary potassium and calcium preferably from fresh fruits, vegetables, and low-fat dairy products.    stressed the importance of regular exercise  Injury prevention: Discussed safety belts, safety helmets, smoke detector, smoking near bedding or upholstery.  Dental health: Discussed importance of regular tooth brushing, flossing, and dental visits.    NEXT  PREVENTATIVE PHYSICAL DUE IN 1 YEAR. Return as able for mole removal, 6 months for follow up.

## 2023-01-09 ENCOUNTER — Other Ambulatory Visit: Payer: Self-pay | Admitting: Family Medicine

## 2023-01-09 DIAGNOSIS — E038 Other specified hypothyroidism: Secondary | ICD-10-CM

## 2023-01-09 LAB — COMPREHENSIVE METABOLIC PANEL
ALT: 23 IU/L (ref 0–32)
AST: 18 IU/L (ref 0–40)
Albumin/Globulin Ratio: 1.8 (ref 1.2–2.2)
Albumin: 4.2 g/dL (ref 3.8–4.9)
Alkaline Phosphatase: 82 IU/L (ref 44–121)
BUN/Creatinine Ratio: 26 — ABNORMAL HIGH (ref 9–23)
BUN: 20 mg/dL (ref 6–24)
Bilirubin Total: 0.3 mg/dL (ref 0.0–1.2)
CO2: 25 mmol/L (ref 20–29)
Calcium: 9.6 mg/dL (ref 8.7–10.2)
Chloride: 102 mmol/L (ref 96–106)
Creatinine, Ser: 0.77 mg/dL (ref 0.57–1.00)
Globulin, Total: 2.3 g/dL (ref 1.5–4.5)
Glucose: 107 mg/dL — ABNORMAL HIGH (ref 70–99)
Potassium: 4.9 mmol/L (ref 3.5–5.2)
Sodium: 139 mmol/L (ref 134–144)
Total Protein: 6.5 g/dL (ref 6.0–8.5)
eGFR: 93 mL/min/{1.73_m2} (ref >59)

## 2023-01-09 LAB — CBC WITH DIFFERENTIAL/PLATELET
Basophils Absolute: 0 10*3/uL (ref 0.0–0.2)
Basos: 1 %
EOS (ABSOLUTE): 0.3 10*3/uL (ref 0.0–0.4)
Eos: 5 %
Hematocrit: 40 % (ref 34.0–46.6)
Hemoglobin: 13.2 g/dL (ref 11.1–15.9)
Immature Grans (Abs): 0 10*3/uL (ref 0.0–0.1)
Immature Granulocytes: 0 %
Lymphocytes Absolute: 1.7 10*3/uL (ref 0.7–3.1)
Lymphs: 33 %
MCH: 30 pg (ref 26.6–33.0)
MCHC: 33 g/dL (ref 31.5–35.7)
MCV: 91 fL (ref 79–97)
Monocytes Absolute: 0.4 10*3/uL (ref 0.1–0.9)
Monocytes: 7 %
Neutrophils Absolute: 2.8 10*3/uL (ref 1.4–7.0)
Neutrophils: 54 %
Platelets: 260 10*3/uL (ref 150–450)
RBC: 4.4 x10E6/uL (ref 3.77–5.28)
RDW: 12.8 % (ref 11.7–15.4)
WBC: 5.2 10*3/uL (ref 3.4–10.8)

## 2023-01-09 LAB — LIPID PANEL W/O CHOL/HDL RATIO
Cholesterol, Total: 218 mg/dL — ABNORMAL HIGH (ref 100–199)
HDL: 58 mg/dL (ref >39)
LDL Chol Calc (NIH): 140 mg/dL — ABNORMAL HIGH (ref 0–99)
Triglycerides: 110 mg/dL (ref 0–149)
VLDL Cholesterol Cal: 20 mg/dL (ref 5–40)

## 2023-01-09 LAB — TSH: TSH: 0.015 u[IU]/mL — ABNORMAL LOW (ref 0.450–4.500)

## 2023-01-09 MED ORDER — LEVOTHYROXINE SODIUM 100 MCG PO TABS: 100.0000 ug | ORAL_TABLET | Freq: Every day | ORAL | 1 refills | Status: DC

## 2023-01-09 NOTE — Assessment & Plan Note (Signed)
Under good control on current regimen. Continue current regimen. Continue to monitor. Call with any concerns. Refills given. Labs drawn today.   

## 2023-01-09 NOTE — Assessment & Plan Note (Signed)
Rechecking labs today. Await results. Treat as needed.  °

## 2023-01-09 NOTE — Assessment & Plan Note (Signed)
Labs drawn today. Await results. Treat as needed.  

## 2023-01-09 NOTE — Assessment & Plan Note (Signed)
Following with neuropsych. Will defer treatment to them. 

## 2023-01-09 NOTE — Assessment & Plan Note (Signed)
Following with neuropsych. Will defer treatment to them.

## 2023-01-12 DIAGNOSIS — R7309 Other abnormal glucose: Secondary | ICD-10-CM | POA: Diagnosis not present

## 2023-01-14 ENCOUNTER — Ambulatory Visit (INDEPENDENT_AMBULATORY_CARE_PROVIDER_SITE_OTHER): Payer: BC Managed Care – PPO | Admitting: Gastroenterology

## 2023-01-14 ENCOUNTER — Encounter: Payer: Self-pay | Admitting: Gastroenterology

## 2023-01-14 ENCOUNTER — Other Ambulatory Visit: Payer: Self-pay

## 2023-01-14 VITALS — BP 116/78 | HR 89 | Temp 97.8°F | Ht 61.0 in | Wt 182.6 lb

## 2023-01-14 DIAGNOSIS — R159 Full incontinence of feces: Secondary | ICD-10-CM | POA: Diagnosis not present

## 2023-01-14 DIAGNOSIS — Z720 Tobacco use: Secondary | ICD-10-CM | POA: Diagnosis not present

## 2023-01-14 DIAGNOSIS — K580 Irritable bowel syndrome with diarrhea: Secondary | ICD-10-CM

## 2023-01-14 MED ORDER — NA SULFATE-K SULFATE-MG SULF 17.5-3.13-1.6 GM/177ML PO SOLN: 354.0000 mL | Freq: Once | ORAL | 0 refills | Status: AC

## 2023-01-14 NOTE — Progress Notes (Signed)
Wyline Mood MD, MRCP(U.K) [address]  [address]  [place], [place] [address]  Main: [phone]  Fax: [phone]   Gastroenterology Consultation  Referring Provider:     Dorcas Carrow, DO Primary Care Physician:  Dorcas Carrow, DO Primary Gastroenterologist:  Dr. Wyline Mood  Reason for Consultation:     Fecal incontinence        HPI:   Pamela Avila is a 52 y.o. y/o female referred for consultation & management  by Dr. Laural Benes, Megan P, DO.    She has been referred for fecal incontinence.  Last seen at our office back in April 2018 for abdominal cramping  In May 2018 she underwent an upper endoscopy that showed a 4 cm hiatal hernia LA grade a esophagitis was commenced on a PPI plan was to repeat EGD in 8 weeks to assess response to treatment.  On the same day a colonoscopy was also performed found to have nonbleeding internal hemorrhoids.  Random colon biopsies were taken to rule out microscopic colitis which were negative.  Biopsies of the esophagus showed features of rare intraepithelial eosinophils possibly reflux esophagitis.  No evidence of celiac disease on duodenal biopsies.  Did not follow-up after.  Please delete her main issue is incontinence which occurs when she is asleep without her knowledge.  She does complain of lower abdominal discomfort before a bowel movement.  She has been on meloxicam which she takes daily for many years for knee pain.  Consumes soda and some daily.  Denies any artificial sugars or sweets.  Denies any history of prolonged labor during the birth of her children.  Denies any known episiotomy that was performed.  Any blood in the stool.  She has been taking magnesium supplements for her headache    Past Medical History:  Diagnosis Date   ADHD (attention deficit hyperactivity disorder)    Anemia    Anxiety    Anxiety disorder    Complication of anesthesia    Depression    Family history of adverse reaction to anesthesia    Family  history of diabetes mellitus    GERD (gastroesophageal reflux disease)    HSV (herpes simplex virus) infection    Hypertension    Hypothyroidism    IBS (irritable bowel syndrome)    Lab test positive for detection of COVID-19 virus    PONV (postoperative nausea and vomiting)    Sleep apnea    Thyroid disease    hypothroidsim    Past Surgical History:  Procedure Laterality Date   BREAST EXCISIONAL BIOPSY Right 2010   neg surgical breast bx     BREAST SURGERY Right    lumpectomy   CESAREAN SECTION     x 2   COLONOSCOPY WITH PROPOFOL N/A 12/28/2016   Procedure: COLONOSCOPY WITH PROPOFOL;  Surgeon: Wyline Mood, MD;  Location: [facility] ENDOSCOPY;  Service: Endoscopy;  Laterality: N/A;   CRYOTHERAPY  20 + years ago   cervix   DILITATION & CURRETTAGE/HYSTROSCOPY WITH NOVASURE ABLATION N/A 04/11/2015   Procedure: DILATATION & CURETTAGE/HYSTEROSCOPY WITH NOVASURE ABLATION;  Surgeon: Herold Harms, MD;  Location: [facility] ORS;  Service: Gynecology;  Laterality: N/A;   ESOPHAGOGASTRODUODENOSCOPY (EGD) WITH PROPOFOL N/A 12/28/2016   Procedure: ESOPHAGOGASTRODUODENOSCOPY (EGD) WITH PROPOFOL;  Surgeon: Wyline Mood, MD;  Location: [facility] ENDOSCOPY;  Service: Endoscopy;  Laterality: N/A;   ESSURE TUBAL LIGATION     lumpectomy Right    NASAL SEPTOPLASTY W/ TURBINOPLASTY Bilateral 10/07/2018   Procedure: SEPTOPLASTY  WITH BILATERAL SUBMUCOUS RESECTION OF INFERIOR TURBINATES;  Surgeon: Linus Salmons, MD;  Location: [facility] ORS;  Service: ENT;  Laterality: Bilateral;   TONSILLECTOMY     TUBAL LIGATION      Prior to Admission medications   Medication Sig Start Date End Date Taking? Authorizing Provider  amphetamine-dextroamphetamine (ADDERALL XR) 10 MG 24 hr capsule Take 1 capsule (10 mg total) by mouth daily. To be taken with 30mg  XR for 40mg  total 12/15/22 01/14/23  Johnson, Megan P, DO  amphetamine-dextroamphetamine (ADDERALL XR) 30 MG 24 hr capsule Take 1 capsule (30 mg total) by mouth every morning. To  be taken with 10mg  XR for 40mg  total 12/15/22 01/14/23  Johnson, Megan P, DO  amphetamine-dextroamphetamine (ADDERALL) 5 MG tablet Take 1 tablet (5 mg total) by mouth daily. 12/15/22 01/14/23  Olevia Perches P, DO  atomoxetine (STRATTERA) 60 MG capsule Take 60 mg by mouth every morning. 10/23/22   [provider]  buPROPion (WELLBUTRIN XL) 150 MG 24 hr tablet TAKE 1 TABLET BY MOUTH EVERY DAY GENERIC EQUIVALENT FOR WELLBUTRIN XL 10/12/22   Johnson, Megan P, DO  busPIRone (BUSPAR) 5 MG tablet Take 5 mg by mouth 2 (two) times daily. 12/06/22   [provider]  clonazePAM (KLONOPIN) 0.5 MG tablet Take 0.5 mg by mouth daily. 06/27/22   [provider]  dicyclomine (BENTYL) 10 MG capsule Take 1 capsule (10 mg total) by mouth 3 (three) times daily as needed for spasms. 01/08/23   Johnson, Megan P, DO  escitalopram (LEXAPRO) 20 MG tablet Take 20 mg by mouth daily. 12/06/22   [provider]  levothyroxine (SYNTHROID) 100 MCG tablet Take 1 tablet (100 mcg total) by mouth daily. 01/09/23   Johnson, Megan P, DO  lisinopril (ZESTRIL) 10 MG tablet Take 1 tablet (10 mg total) by mouth daily. 01/08/23   Olevia Perches P, DO  Magnesium 250 MG TABS Take 3 tablets by mouth daily at 6 (six) AM.    [provider]  meclizine (ANTIVERT) 25 MG tablet Take 1 tablet (25 mg total) by mouth 3 (three) times daily as needed for dizziness. 04/05/20   Johnson, Megan P, DO  meloxicam (MOBIC) 15 MG tablet Take 1 tablet (15 mg total) by mouth daily. 10/12/22   Johnson, Megan P, DO  metoprolol succinate (TOPROL-XL) 25 MG 24 hr tablet TAKE ONE AND ONE-HALF TABLETS( 37.5 MG TOTAL) BY MOUTH DAILY GENERIC EQUIVALENT FOR TOPROL XL 01/08/23   Johnson, Megan P, DO  naratriptan (AMERGE) 2.5 MG tablet Take 1 tab at onset of migraine. May repeat in 2 hours if needed. Max 2/day and 2-3 days/wk. 10/09/22   [provider]  nortriptyline (PAMELOR) 10 MG capsule Take by mouth 2 (two) times daily. 05/05/22   [provider]  omeprazole (PRILOSEC) 20 MG capsule TAKE ONE CAPSULE BY MOUTH TWICE DAILY AS NEEDED FOR HEARTBURN/ INDIGESTION. 01/08/23   Johnson, Megan P, DO  ondansetron (ZOFRAN-ODT) 4 MG disintegrating tablet Take 4 mg by mouth every 8 (eight) hours as needed. 02/09/22   [provider]  polyethylene glycol powder (GLYCOLAX/MIRALAX) 17 GM/SCOOP powder Take 17 g by mouth 2 (two) times daily as needed. 10/16/22   Johnson, Megan P, DO  QULIPTA 60 MG TABS Take 1 tablet by mouth daily. 12/18/22   [provider]    Family History  Problem Relation Age of Onset   Hypothyroidism Paternal Grandfather    Breast cancer Paternal Grandmother    Cancer Paternal Grandmother  breast   Breast cancer Maternal Grandmother    Hyperlipidemia Maternal Grandfather    Diabetes Father    Obesity Father    Hypertension Father    Hyperlipidemia Father    COPD Father    Ovarian cancer Paternal Aunt        ?   Cancer Paternal Aunt        breast and cervical   Breast cancer Paternal Aunt    Hypothyroidism Mother    Breast cancer Son    Cancer Son        breast   Colon cancer Neg Hx      Social History   Tobacco Use   Smoking status: Former    Types: Cigarettes   Smokeless tobacco: Never   Tobacco comments:    occasional  Vaping Use   Vaping Use: Former  Substance Use Topics   Alcohol use: Yes    Alcohol/week: 12.0 standard drinks of alcohol    Types: 12 Standard drinks or equivalent per week   Drug use: Not Currently    Types: Marijuana    Comment: Occasional, hx of cocaine in past    Allergies as of 01/14/2023 - Review Complete 01/14/2023  Allergen Reaction Noted   Erythromycin Itching 02/01/2015   Bactrim [sulfamethoxazole-trimethoprim] Rash 03/20/2022    Review of Systems:    All systems reviewed and negative except where noted in HPI.   Physical Exam:  BP 116/78   Pulse 89   Temp 97.8 F (36.6 C) (Oral)   Ht 5\' 1"  (1.549 m)   Wt 182 lb 9.6 oz (82.8 kg)    BMI 34.50 kg/m  No LMP recorded. Patient has had an ablation. Psych:  Alert and cooperative. Normal mood and affect. General:   Alert,  Well-developed, well-nourished, pleasant and cooperative in NAD Head:  Normocephalic and atraumatic. Eyes:  Sclera clear, no icterus.   Conjunctiva pink. Ears:  Normal auditory acuity.  Neurologic:  Alert and oriented x3;  grossly normal neurologically. Psych:  Alert and cooperative. Normal mood and affect.  Imaging Studies: No results found.  Assessment and Plan:   JONTA CHIEN is a 52 y.o. y/o female has been referred for incontinence which occurs involuntarily during her sleep fecal owing on for a few months if not longer.  Likely due to a combination of loose stools which could be related to consumption of magnesium supplements, high fructose corn syrup and other foods, in addition could have been from longstanding effects of meloxicam in the colon causing colitis or proctitis.  I will proceed with the stool studies, fecal calprotectin, colonoscopy.  The day of her procedure I will perform a rectal exam to determine the tone of her anus.  Will also rule out microscopic colitis.  I have advised her to go on a diet devoid of any artificial sugars, high fructose corn syrup, meloxicam, dairy.  If her loose stools resolved to add 1 food group at a time to determine which causes the most symptoms.   I have discussed alternative options, risks & benefits,  which include, but are not limited to, bleeding, infection, perforation,respiratory complication & drug reaction.  The patient agrees with this plan & written consent will be obtained.     Follow up in 8 to 12 weeks  Dr Wyline Mood MD,MRCP(U.K)

## 2023-01-15 LAB — CYTOLOGY - PAP
Comment: NEGATIVE
Diagnosis: NEGATIVE
High risk HPV: NEGATIVE

## 2023-01-17 ENCOUNTER — Ambulatory Visit: Payer: BC Managed Care – PPO | Admitting: Surgery

## 2023-01-22 ENCOUNTER — Ambulatory Visit (INDEPENDENT_AMBULATORY_CARE_PROVIDER_SITE_OTHER): Payer: BC Managed Care – PPO | Admitting: Surgery

## 2023-01-22 ENCOUNTER — Encounter: Payer: Self-pay | Admitting: Surgery

## 2023-01-22 VITALS — BP 140/70 | HR 89 | Temp 98.0°F | Ht 61.0 in | Wt 177.0 lb

## 2023-01-22 DIAGNOSIS — K580 Irritable bowel syndrome with diarrhea: Secondary | ICD-10-CM | POA: Diagnosis not present

## 2023-01-22 DIAGNOSIS — R222 Localized swelling, mass and lump, trunk: Secondary | ICD-10-CM | POA: Diagnosis not present

## 2023-01-22 DIAGNOSIS — M7989 Other specified soft tissue disorders: Secondary | ICD-10-CM

## 2023-01-22 DIAGNOSIS — R159 Full incontinence of feces: Secondary | ICD-10-CM | POA: Diagnosis not present

## 2023-01-22 NOTE — Patient Instructions (Addendum)
Our surgery scheduler Barbara will call you within 24-48 hours to get you scheduled. If you have not heard from her after 48 hours, please call our office. Have the blue sheet available when she calls to write down important information.  If you have any concerns or questions, please feel free to call our office.   Lipoma Removal  Lipoma removal is a surgical procedure to remove a lipoma, which is a noncancerous (benign) tumor that is made up of fat cells. Most lipomas are small and painless and do not require treatment. They can form in many areas of the body but are most common under the skin of the back, arms, shoulders, buttocks, and thighs. You may need lipoma removal if you have a lipoma that is large, growing, or causing discomfort. Lipoma removal may also be done for cosmetic reasons. Tell a health care provider about: Any allergies you have. All medicines you are taking, including vitamins, herbs, eye drops, creams, and over-the-counter medicines. Any problems you or family members have had with anesthetic medicines. Any bleeding problems you have. Any surgeries you have had. Any medical conditions you have. Whether you are pregnant or may be pregnant. What are the risks? Generally, this is a safe procedure. However, problems may occur, including: Infection. Bleeding. Scarring. Allergic reactions to medicines. Damage to nearby structures or organs, such as damage to nerves or blood vessels near the lipoma. What happens before the procedure? When to Stop Eating and Drinking Follow instructions from your health care provider about what you may eat and drink before your procedure. These may include: 8 hours before your procedure Stop eating most foods. Do not eat meat, fried foods, or fatty foods. Eat only light foods, such as toast or crackers. All liquids are okay except energy drinks and alcohol. 6 hours before your procedure Stop eating. Drink only clear liquids, such as  water, clear fruit juice, black coffee, plain tea, and sports drinks. Do not drink energy drinks or alcohol. 2 hours before your procedure Stop drinking all liquids. You may be allowed to take medicines with small sips of water. If you do not follow your health care provider's instructions, your procedure may be delayed or canceled. Medicines Ask your health care provider about: Changing or stopping your regular medicines. This is especially important if you are taking diabetes medicines or blood thinners. Taking medicines such as aspirin and ibuprofen. These medicines can thin your blood. Do not take these medicines unless your health care provider tells you to take them. Taking over-the-counter medicines, vitamins, herbs, and supplements. General instructions You will have a physical exam. Your health care provider will check the size of the lipoma and whether it can be removed easily. You may have a biopsy and imaging tests, such as X-rays, a CT scan, and an MRI. Do not use any products that contain nicotine or tobacco for at least 4 weeks before the procedure. These products include cigarettes, chewing tobacco, and vaping devices, such as e-cigarettes. If you need help quitting, ask your health care provider. Ask your health care provider: How your surgery site will be marked. What steps will be taken to help prevent infection. These may include: Washing skin with a germ-killing soap. Taking antibiotic medicine. If you will be going home right after the procedure, plan to have a responsible adult: Take you home from the hospital or clinic. You will not be allowed to drive. Care for you for the time you are told. What happens during the   procedure?  An IV will be inserted into one of your veins. You will be given one or more of the following: A medicine to help you relax (sedative). A medicine to numb the area (local anesthetic). A medicine to make you fall asleep (general  anesthetic). A medicine that is injected into an area of your body to numb everything below the injection site (regional anesthetic). An incision will be made into the skin over the lipoma or very near the lipoma. The incision may be made in a natural skin line or crease. Tissues, nerves, and blood vessels near the lipoma will be moved out of the way. The lipoma and the capsule that surrounds it will be separated from the surrounding tissues. The lipoma will be removed. The incision may be closed with stitches (sutures). A bandage (dressing) will be placed over the incision. The procedure may vary among health care providers and hospitals. What happens after the procedure? Your blood pressure, heart rate, breathing rate, and blood oxygen level will be monitored until you leave the hospital or clinic. If you were prescribed an antibiotic medicine, use it as told by your health care provider. Do not stop using the antibiotic even if you start to feel better. If you were given a sedative during the procedure, it can affect you for several hours. Do not drive or operate machinery until your health care provider says that it is safe. Where to find more information OrthoInfo: orthoinfo.aaos.org Summary Before the procedure, follow instructions from your health care provider about eating and drinking, and changing or stopping your regular medicines. This is especially important if you are taking diabetes medicines or blood thinners. After the lipoma is removed, the incision may be closed with stitches (sutures) and covered with a bandage (dressing). If you were given a sedative during the procedure, it can affect you for several hours. Do not drive or operate machinery until your health care provider says that it is safe. This information is not intended to replace advice given to you by your health care provider. Make sure you discuss any questions you have with your health care provider. Document  Revised: 08/18/2021 Document Reviewed: 08/18/2021 Elsevier Patient Education  2024 [employer]  

## 2023-01-23 ENCOUNTER — Telehealth: Payer: Self-pay | Admitting: Surgery

## 2023-01-23 ENCOUNTER — Ambulatory Visit: Payer: Self-pay | Admitting: Surgery

## 2023-01-23 ENCOUNTER — Encounter: Payer: Self-pay | Admitting: Gastroenterology

## 2023-01-23 DIAGNOSIS — M7989 Other specified soft tissue disorders: Secondary | ICD-10-CM

## 2023-01-23 NOTE — Telephone Encounter (Signed)
Left message for patient to call, please inform her of the following regarding scheduled surgery.   Pre-Admission date/time, and Surgery date at [facility].  Surgery Date: 01/30/23 Preadmission Testing Date: 01/25/23 (phone 8a-1p)  Also patient will need to call at [phone], between 1-3:00pm the day before surgery, to find out what time to arrive for surgery.

## 2023-01-23 NOTE — H&P (View-Only) (Signed)
Patient ID: Pamela Avila, female   DOB: 07/18/1971, 52 y.o.   MRN: [id number]  Chief Complaint: Right inferior lateral buttock soft tissue mass  History of Present Illness Pamela Avila is a 52 y.o. female with a mass at the very cephalad aspect of her right thigh laterally at the transition to the buttock.  Present for 4 months.  Somewhat increased in size.  Tender with direct pressure.  Noticeable through clothing with tight fitting garments.  Causing significant asymmetry.  Past Medical History Past Medical History:  Diagnosis Date   ADHD (attention deficit hyperactivity disorder)    Anemia    Anxiety    Anxiety disorder    Complication of anesthesia    Depression    Family history of adverse reaction to anesthesia    Family history of diabetes mellitus    GERD (gastroesophageal reflux disease)    HSV (herpes simplex virus) infection    Hypertension    Hypothyroidism    IBS (irritable bowel syndrome)    Lab test positive for detection of COVID-19 virus    PONV (postoperative nausea and vomiting)    Sleep apnea    Thyroid disease    hypothroidsim      Past Surgical History:  Procedure Laterality Date   BREAST EXCISIONAL BIOPSY Right 2010   neg surgical breast bx     BREAST SURGERY Right    lumpectomy   CESAREAN SECTION     x 2   COLONOSCOPY WITH PROPOFOL N/A 12/28/2016   Procedure: COLONOSCOPY WITH PROPOFOL;  Surgeon: Anna, Kiran, MD;  Location: [facility];  Service: Endoscopy;  Laterality: N/A;   CRYOTHERAPY  20 + years ago   cervix   DILITATION & CURRETTAGE/HYSTROSCOPY WITH NOVASURE ABLATION N/A 04/11/2015   Procedure: DILATATION & CURETTAGE/HYSTEROSCOPY WITH NOVASURE ABLATION;  Surgeon: Martin A Defrancesco, MD;  Location: [facility];  Service: Gynecology;  Laterality: N/A;   ESOPHAGOGASTRODUODENOSCOPY (EGD) WITH PROPOFOL N/A 12/28/2016   Procedure: ESOPHAGOGASTRODUODENOSCOPY (EGD) WITH PROPOFOL;  Surgeon: Anna, Kiran, MD;  Location: [facility];  Service: Endoscopy;   Laterality: N/A;   ESSURE TUBAL LIGATION     lumpectomy Right    NASAL SEPTOPLASTY W/ TURBINOPLASTY Bilateral 10/07/2018   Procedure: SEPTOPLASTY WITH BILATERAL SUBMUCOUS RESECTION OF INFERIOR TURBINATES;  Surgeon: McQueen, Chapman, MD;  Location: [facility];  Service: ENT;  Laterality: Bilateral;   TONSILLECTOMY     TUBAL LIGATION      Allergies  Allergen Reactions   Erythromycin Itching   Bactrim [Sulfamethoxazole-Trimethoprim] Rash    Current Outpatient Medications  Medication Sig Dispense Refill   amphetamine-dextroamphetamine (ADDERALL XR) 20 MG 24 hr capsule Take 40 mg by mouth daily.     buPROPion (WELLBUTRIN XL) 150 MG 24 hr tablet TAKE 1 TABLET BY MOUTH EVERY DAY GENERIC EQUIVALENT FOR WELLBUTRIN XL 90 tablet 1   busPIRone (BUSPAR) 5 MG tablet Take 5 mg by mouth 2 (two) times daily.     clonazePAM (KLONOPIN) 0.5 MG tablet Take 0.5 mg by mouth daily.     dicyclomine (BENTYL) 10 MG capsule Take 1 capsule (10 mg total) by mouth 3 (three) times daily as needed for spasms. 270 capsule 1   escitalopram (LEXAPRO) 20 MG tablet Take 20 mg by mouth daily.     levothyroxine (SYNTHROID) 100 MCG tablet Take 1 tablet (100 mcg total) by mouth daily. 30 tablet 1   lisinopril (ZESTRIL) 10 MG tablet Take 1 tablet (10 mg total) by mouth daily. 90 tablet 1   meclizine (ANTIVERT)   25 MG tablet Take 1 tablet (25 mg total) by mouth 3 (three) times daily as needed for dizziness. 30 tablet 0   metoprolol succinate (TOPROL-XL) 25 MG 24 hr tablet TAKE ONE AND ONE-HALF TABLETS( 37.5 MG TOTAL) BY MOUTH DAILY GENERIC EQUIVALENT FOR TOPROL XL 135 tablet 1   naratriptan (AMERGE) 2.5 MG tablet Take 1 tab at onset of migraine. May repeat in 2 hours if needed. Max 2/day and 2-3 days/wk.     nortriptyline (PAMELOR) 10 MG capsule Take by mouth 2 (two) times daily.     omeprazole (PRILOSEC) 20 MG capsule TAKE ONE CAPSULE BY MOUTH TWICE DAILY AS NEEDED FOR HEARTBURN/ INDIGESTION. 180 capsule 1   ondansetron  (ZOFRAN-ODT) 4 MG disintegrating tablet Take 4 mg by mouth every 8 (eight) hours as needed.     polyethylene glycol powder (GLYCOLAX/MIRALAX) 17 GM/SCOOP powder Take 17 g by mouth 2 (two) times daily as needed. 3350 g 1   QULIPTA 60 MG TABS Take 1 tablet by mouth daily.     No current facility-administered medications for this visit.    Family History Family History  Problem Relation Age of Onset   Hypothyroidism Paternal Grandfather    Breast cancer Paternal Grandmother    Cancer Paternal Grandmother        breast   Breast cancer Maternal Grandmother    Hyperlipidemia Maternal Grandfather    Diabetes Father    Obesity Father    Hypertension Father    Hyperlipidemia Father    COPD Father    Ovarian cancer Paternal Aunt        ?   Cancer Paternal Aunt        breast and cervical   Breast cancer Paternal Aunt    Hypothyroidism Mother    Breast cancer Son    Cancer Son        breast   Colon cancer Neg Hx       Social History Social History   Tobacco Use   Smoking status: Former    Types: Cigarettes    Passive exposure: Past   Smokeless tobacco: Never  Vaping Use   Vaping Use: Former  Substance Use Topics   Alcohol use: Yes    Alcohol/week: 12.0 standard drinks of alcohol    Types: 12 Standard drinks or equivalent per week   Drug use: Not Currently    Types: Marijuana    Comment: Occasional, hx of cocaine in past        Review of Systems  Constitutional: Negative.   HENT:  Positive for tinnitus.   Eyes:  Positive for blurred vision and double vision.  Respiratory: Negative.    Cardiovascular: Negative.   Gastrointestinal:  Positive for diarrhea, heartburn and nausea.  Genitourinary: Negative.   Skin: Negative.   Neurological:  Positive for dizziness, loss of consciousness and headaches (Postconcussive syndrome).  Psychiatric/Behavioral:  Positive for depression.      Physical Exam Blood pressure (!) 140/70, pulse 89, temperature 98 F (36.7 C), height  5' 1" (1.549 m), weight 177 lb (80.3 kg), SpO2 98 %. Last Weight  Most recent update: 01/22/2023  3:57 PM    Weight  80.3 kg (177 lb)             CONSTITUTIONAL: Well developed, and nourished, appropriately responsive and aware without distress.   EYES: Sclera non-icteric.   EARS, NOSE, MOUTH AND THROAT:  The oropharynx is clear. Oral mucosa is pink and moist.    Hearing is intact to   voice.  NECK: Trachea is midline, and there is no jugular venous distension.  LYMPH NODES:  Lymph nodes in the neck are not appreciated. RESPIRATORY:  Lungs are clear, and breath sounds are equal bilaterally.  Normal respiratory effort without pathologic use of accessory muscles. CARDIOVASCULAR: Heart is regular in rate and rhythm.   Well perfused.  GI: The abdomen is  soft, nontender, and nondistended. There were no palpable masses.  MUSCULOSKELETAL:  Symmetrical muscle tone appreciated in all four extremities.    SKIN: Skin turgor is normal. No pathologic skin lesions appreciated.  Caryl Lyn present as chaperone: There is a soft tissue mass at the junction of the posterior lateral right buttock with the thigh.  It is approximately 10 to 14 cm medial to lateral and about 6 to 7 cm cephalad to caudal.  Difficult to appreciate when the hip is flexed.    NEUROLOGIC:  Motor and sensation appear grossly normal.  Cranial nerves are grossly without defect. PSYCH:  Alert and oriented to person, place and time. Affect is appropriate for situation.  Data Reviewed I have personally reviewed what is currently available of the patient's imaging, recent labs and medical records.   Labs:     Latest Ref Rng & Units 01/08/2023    4:11 PM 12/05/2021    4:23 PM 05/26/2021   11:09 AM  CBC  WBC 3.4 - 10.8 x10E3/uL 5.2  7.1  4.9   Hemoglobin 11.1 - 15.9 g/dL 13.2  13.6  13.3   Hematocrit 34.0 - 46.6 % 40.0  40.8  40.3   Platelets 150 - 450 x10E3/uL 260  304  241       Latest Ref Rng & Units 01/08/2023    4:11 PM  05/31/2022    3:41 PM 12/05/2021    4:23 PM  CMP  Glucose 70 - 99 mg/dL 107  102  77   BUN 6 - 24 mg/dL 20  14  16   Creatinine 0.57 - 1.00 mg/dL 0.77  0.81  1.01   Sodium 134 - 144 mmol/L 139  136  142   Potassium 3.5 - 5.2 mmol/L 4.9  4.0  3.8   Chloride 96 - 106 mmol/L 102  98  103   CO2 20 - 29 mmol/L 25  24  25   Calcium 8.7 - 10.2 mg/dL 9.6  9.5  9.4   Total Protein 6.0 - 8.5 g/dL 6.5   7.0   Total Bilirubin 0.0 - 1.2 mg/dL 0.3   0.4   Alkaline Phos 44 - 121 IU/L 82   86   AST 0 - 40 IU/L 18   29   ALT 0 - 32 IU/L 23   48       Imaging: Radiological images reviewed:   Within last 24 hrs: No results found.  Assessment    Soft tissue mass right buttock, 10 to 14 cm in largest diameter. Patient Active Problem List   Diagnosis Date Noted   Concussion with unknown loss of consciousness status 02/14/2022   Obesity (BMI 30.0-34.9) 06/29/2019   Obstructive sleep apnea treated with continuous positive airway pressure (CPAP) 10/16/2018   Excessive daytime sleepiness 07/26/2018   Inadequate sleep hygiene 07/26/2018   Dysthymia 07/26/2018   Circadian rhythm sleep disorder, shift work type 07/26/2018   Status post endometrial ablation 01/09/2018   Right atrial enlargement 05/26/2015   Family history of diabetes mellitus    ADHD (attention deficit hyperactivity disorder) 02/01/2015   Genital herpes 02/01/2015     Irritable bowel syndrome 02/01/2015   Hypothyroidism 02/01/2015   Anxiety and depression 02/01/2015   Benign essential hypertension 02/01/2015   GERD (gastroesophageal reflux disease) 02/01/2015   Tobacco user 02/01/2015    Plan    Excision of soft tissue mass right buttock;  We discussed the role of anesthesia, and its necessity considering size of the mass.  We discussed the risks of infection, recurrence, wound dehiscence, hematoma or seroma formation etc.  I believe she understands these risks are not all inclusive.  She had her questions adequately answered I  believe.  No guarantees were ever expressed or implied.  Face-to-face time spent with the patient and accompanying care providers(if present) was 30 minutes, with more than 50% of the time spent counseling, educating, and coordinating care of the patient.    These notes generated with voice recognition software. I apologize for typographical errors.  Ryleeann Urquiza M.D., FACS 01/23/2023, 5:58 PM     

## 2023-01-23 NOTE — Progress Notes (Signed)
Patient ID: Pamela Avila, female   DOB: [birth year], 52 y.o.   MRN: [id number]  Chief Complaint: Right inferior lateral buttock soft tissue mass  History of Present Illness Pamela Avila is a 52 y.o. female with a mass at the very cephalad aspect of her right thigh laterally at the transition to the buttock.  Present for 4 months.  Somewhat increased in size.  Tender with direct pressure.  Noticeable through clothing with tight fitting garments.  Causing significant asymmetry.  Past Medical History Past Medical History:  Diagnosis Date   ADHD (attention deficit hyperactivity disorder)    Anemia    Anxiety    Anxiety disorder    Complication of anesthesia    Depression    Family history of adverse reaction to anesthesia    Family history of diabetes mellitus    GERD (gastroesophageal reflux disease)    HSV (herpes simplex virus) infection    Hypertension    Hypothyroidism    IBS (irritable bowel syndrome)    Lab test positive for detection of COVID-19 virus    PONV (postoperative nausea and vomiting)    Sleep apnea    Thyroid disease    hypothroidsim      Past Surgical History:  Procedure Laterality Date   BREAST EXCISIONAL BIOPSY Right 2010   neg surgical breast bx     BREAST SURGERY Right    lumpectomy   CESAREAN SECTION     x 2   COLONOSCOPY WITH PROPOFOL N/A 12/28/2016   Procedure: COLONOSCOPY WITH PROPOFOL;  Surgeon: Wyline Mood, MD;  Location: [facility] END[facility];  Service: Endoscopy;  Laterality: N/A;   CRYOTHERAPY  20 + years ago   cervix   DILITATION & CURRETTAGE/HYSTROSCOPY WITH NOVASURE ABLATION N/A 04/11/2015   Procedure: DILATATION & CURETTAGE/HYSTEROSCOPY WITH NOVASURE ABLATION;  Surgeon: Herold Harms, MD;  Location: [facility];  Service: Gynecology;  Laterality: N/A;   ESOPHAGOGASTRODUODENOSCOPY (EGD) WITH PROPOFOL N/A 12/28/2016   Procedure: ESOPHAGOGASTRODUODENOSCOPY (EGD) WITH PROPOFOL;  Surgeon: Wyline Mood, MD;  Location: [facility];  Service: Endoscopy;   Laterality: N/A;   ESSURE TUBAL LIGATION     lumpectomy Right    NASAL SEPTOPLASTY W/ TURBINOPLASTY Bilateral 10/07/2018   Procedure: SEPTOPLASTY WITH BILATERAL SUBMUCOUS RESECTION OF INFERIOR TURBINATES;  Surgeon: Linus Salmons, MD;  Location: [facility];  Service: ENT;  Laterality: Bilateral;   TONSILLECTOMY     TUBAL LIGATION      Allergies  Allergen Reactions   Erythromycin Itching   Bactrim [Sulfamethoxazole-Trimethoprim] Rash    Current Outpatient Medications  Medication Sig Dispense Refill   amphetamine-dextroamphetamine (ADDERALL XR) 20 MG 24 hr capsule Take 40 mg by mouth daily.     buPROPion (WELLBUTRIN XL) 150 MG 24 hr tablet TAKE 1 TABLET BY MOUTH EVERY DAY GENERIC EQUIVALENT FOR WELLBUTRIN XL 90 tablet 1   busPIRone (BUSPAR) 5 MG tablet Take 5 mg by mouth 2 (two) times daily.     clonazePAM (KLONOPIN) 0.5 MG tablet Take 0.5 mg by mouth daily.     dicyclomine (BENTYL) 10 MG capsule Take 1 capsule (10 mg total) by mouth 3 (three) times daily as needed for spasms. 270 capsule 1   escitalopram (LEXAPRO) 20 MG tablet Take 20 mg by mouth daily.     levothyroxine (SYNTHROID) 100 MCG tablet Take 1 tablet (100 mcg total) by mouth daily. 30 tablet 1   lisinopril (ZESTRIL) 10 MG tablet Take 1 tablet (10 mg total) by mouth daily. 90 tablet 1   meclizine (ANTIVERT)  25 MG tablet Take 1 tablet (25 mg total) by mouth 3 (three) times daily as needed for dizziness. 30 tablet 0   metoprolol succinate (TOPROL-XL) 25 MG 24 hr tablet TAKE ONE AND ONE-HALF TABLETS( 37.5 MG TOTAL) BY MOUTH DAILY GENERIC EQUIVALENT FOR TOPROL XL 135 tablet 1   naratriptan (AMERGE) 2.5 MG tablet Take 1 tab at onset of migraine. May repeat in 2 hours if needed. Max 2/day and 2-3 days/wk.     nortriptyline (PAMELOR) 10 MG capsule Take by mouth 2 (two) times daily.     omeprazole (PRILOSEC) 20 MG capsule TAKE ONE CAPSULE BY MOUTH TWICE DAILY AS NEEDED FOR HEARTBURN/ INDIGESTION. 180 capsule 1   ondansetron  (ZOFRAN-ODT) 4 MG disintegrating tablet Take 4 mg by mouth every 8 (eight) hours as needed.     polyethylene glycol powder (GLYCOLAX/MIRALAX) 17 GM/SCOOP powder Take 17 g by mouth 2 (two) times daily as needed. 3350 g 1   QULIPTA 60 MG TABS Take 1 tablet by mouth daily.     No current facility-administered medications for this visit.    Family History Family History  Problem Relation Age of Onset   Hypothyroidism Paternal Grandfather    Breast cancer Paternal Grandmother    Cancer Paternal Grandmother        breast   Breast cancer Maternal Grandmother    Hyperlipidemia Maternal Grandfather    Diabetes Father    Obesity Father    Hypertension Father    Hyperlipidemia Father    COPD Father    Ovarian cancer Paternal Aunt        ?   Cancer Paternal Aunt        breast and cervical   Breast cancer Paternal Aunt    Hypothyroidism Mother    Breast cancer Son    Cancer Son        breast   Colon cancer Neg Hx       Social History Social History   Tobacco Use   Smoking status: Former    Types: Cigarettes    Passive exposure: Past   Smokeless tobacco: Never  Vaping Use   Vaping Use: Former  Substance Use Topics   Alcohol use: Yes    Alcohol/week: 12.0 standard drinks of alcohol    Types: 12 Standard drinks or equivalent per week   Drug use: Not Currently    Types: Marijuana    Comment: Occasional, hx of cocaine in past        Review of Systems  Constitutional: Negative.   HENT:  Positive for tinnitus.   Eyes:  Positive for blurred vision and double vision.  Respiratory: Negative.    Cardiovascular: Negative.   Gastrointestinal:  Positive for diarrhea, heartburn and nausea.  Genitourinary: Negative.   Skin: Negative.   Neurological:  Positive for dizziness, loss of consciousness and headaches (Postconcussive syndrome).  Psychiatric/Behavioral:  Positive for depression.      Physical Exam Blood pressure (!) 140/70, pulse 89, temperature 98 F (36.7 C), height  5\' 1"  (1.549 m), weight 177 lb (80.3 kg), SpO2 98 %. Last Weight  Most recent update: 01/22/2023  3:57 PM    Weight  80.3 kg (177 lb)             CONSTITUTIONAL: Well developed, and nourished, appropriately responsive and aware without distress.   EYES: Sclera non-icteric.   EARS, NOSE, MOUTH AND THROAT:  The oropharynx is clear. Oral mucosa is pink and moist.    Hearing is intact to  voice.  NECK: Trachea is midline, and there is no jugular venous distension.  LYMPH NODES:  Lymph nodes in the neck are not appreciated. RESPIRATORY:  Lungs are clear, and breath sounds are equal bilaterally.  Normal respiratory effort without pathologic use of accessory muscles. CARDIOVASCULAR: Heart is regular in rate and rhythm.   Well perfused.  GI: The abdomen is  soft, nontender, and nondistended. There were no palpable masses.  MUSCULOSKELETAL:  Symmetrical muscle tone appreciated in all four extremities.    SKIN: Skin turgor is normal. No pathologic skin lesions appreciated.  Caryl Lyn present as chaperone: There is a soft tissue mass at the junction of the posterior lateral right buttock with the thigh.  It is approximately 10 to 14 cm medial to lateral and about 6 to 7 cm cephalad to caudal.  Difficult to appreciate when the hip is flexed.    NEUROLOGIC:  Motor and sensation appear grossly normal.  Cranial nerves are grossly without defect. PSYCH:  Alert and oriented to person, place and time. Affect is appropriate for situation.  Data Reviewed I have personally reviewed what is currently available of the patient's imaging, recent labs and medical records.   Labs:     Latest Ref Rng & Units 01/08/2023    4:11 PM 12/05/2021    4:23 PM 05/26/2021   11:09 AM  CBC  WBC 3.4 - 10.8 x10E3/uL 5.2  7.1  4.9   Hemoglobin 11.1 - 15.9 g/dL 16.1  09.6  04.5   Hematocrit 34.0 - 46.6 % 40.0  40.8  40.3   Platelets 150 - 450 x10E3/uL 260  304  241       Latest Ref Rng & Units 01/08/2023    4:11 PM  05/31/2022    3:41 PM 12/05/2021    4:23 PM  CMP  Glucose 70 - 99 mg/dL 409  811  77   BUN 6 - 24 mg/dL 20  14  16    Creatinine 0.57 - 1.00 mg/dL 9.14  7.82  9.56   Sodium 134 - 144 mmol/L 139  136  142   Potassium 3.5 - 5.2 mmol/L 4.9  4.0  3.8   Chloride 96 - 106 mmol/L 102  98  103   CO2 20 - 29 mmol/L 25  24  25    Calcium 8.7 - 10.2 mg/dL 9.6  9.5  9.4   Total Protein 6.0 - 8.5 g/dL 6.5   7.0   Total Bilirubin 0.0 - 1.2 mg/dL 0.3   0.4   Alkaline Phos 44 - 121 IU/L 82   86   AST 0 - 40 IU/L 18   29   ALT 0 - 32 IU/L 23   48       Imaging: Radiological images reviewed:   Within last 24 hrs: No results found.  Assessment    Soft tissue mass right buttock, 10 to 14 cm in largest diameter. Patient Active Problem List   Diagnosis Date Noted   Concussion with unknown loss of consciousness status 02/14/2022   Obesity (BMI 30.0-34.9) 06/29/2019   Obstructive sleep apnea treated with continuous positive airway pressure (CPAP) 10/16/2018   Excessive daytime sleepiness 07/26/2018   Inadequate sleep hygiene 07/26/2018   Dysthymia 07/26/2018   Circadian rhythm sleep disorder, shift work type 07/26/2018   Status post endometrial ablation 01/09/2018   Right atrial enlargement 05/26/2015   Family history of diabetes mellitus    ADHD (attention deficit hyperactivity disorder) 02/01/2015   Genital herpes 02/01/2015  Irritable bowel syndrome 02/01/2015   Hypothyroidism 02/01/2015   Anxiety and depression 02/01/2015   Benign essential hypertension 02/01/2015   GERD (gastroesophageal reflux disease) 02/01/2015   Tobacco user 02/01/2015    Plan    Excision of soft tissue mass right buttock;  We discussed the role of anesthesia, and its necessity considering size of the mass.  We discussed the risks of infection, recurrence, wound dehiscence, hematoma or seroma formation etc.  I believe she understands these risks are not all inclusive.  She had her questions adequately answered I  believe.  No guarantees were ever expressed or implied.  Face-to-face time spent with the patient and accompanying care providers(if present) was 30 minutes, with more than 50% of the time spent counseling, educating, and coordinating care of the patient.    These notes generated with voice recognition software. I apologize for typographical errors.  Campbell Lerner M.D., FACS 01/23/2023, 5:58 PM

## 2023-01-24 LAB — GI PROFILE, STOOL, PCR
Adenovirus F 40/41: NOT DETECTED
Cryptosporidium: NOT DETECTED
Entamoeba histolytica: NOT DETECTED

## 2023-01-24 NOTE — Telephone Encounter (Signed)
Called patient again, she is now informed of all dates regarding surgery.   

## 2023-01-25 ENCOUNTER — Other Ambulatory Visit: Payer: Self-pay

## 2023-01-25 ENCOUNTER — Encounter
Admission: RE | Admit: 2023-01-25 | Discharge: 2023-01-25 | Disposition: A | Discharge status: Home / Self Care | Payer: BC Managed Care – PPO | Source: Ambulatory Visit | Attending: Surgery | Admitting: Surgery

## 2023-01-25 DIAGNOSIS — I10 Essential (primary) hypertension: Secondary | ICD-10-CM

## 2023-01-25 DIAGNOSIS — Z01812 Encounter for preprocedural laboratory examination: Secondary | ICD-10-CM

## 2023-01-25 HISTORY — DX: Headache, unspecified: R51.9

## 2023-01-25 LAB — GI PROFILE, STOOL, PCR
Campylobacter: NOT DETECTED
Giardia lamblia: NOT DETECTED
Norovirus GI/GII: NOT DETECTED
Shigella/Enteroinvasive E coli: NOT DETECTED

## 2023-01-25 LAB — C DIFFICILE TOXINS A+B W/RFLX: C difficile Toxins A+B, EIA: NEGATIVE

## 2023-01-25 NOTE — Patient Instructions (Addendum)
Your procedure is scheduled on: 01/30/23 - Wednesday Report to the Registration Desk on the 1st floor of the [facility]. To find out your arrival time, please call [phone] between 1PM - 3PM on: 01/29/23 - Tuesday If your arrival time is 6:00 am, do not arrive before that time as the [facility] entrance doors do not open until 6:00 am.  REMEMBER: Instructions that are not followed completely may result in serious medical risk, up to and including death; or upon the discretion of your surgeon and anesthesiologist your surgery may need to be rescheduled.  Do not eat food or drink any liquids after midnight the night before surgery.  No gum chewing or hard candies.   One week prior to surgery: Stop Anti-inflammatories (NSAIDS) such as Advil, Aleve, Ibuprofen, Motrin, Naproxen, Naprosyn and Aspirin based products such as Excedrin, Goody's Powder, BC Powder.  Stop ANY OVER THE COUNTER supplements until after surgery.  You may take Tylenol if needed for pain up until the day of surgery.   TAKE ONLY THESE MEDICATIONS THE MORNING OF SURGERY WITH A SIP OF WATER:  omeprazole (PRILOSEC) - (take one the night before and one on the morning of surgery - helps to prevent nausea after surgery.) buPROPion (WELLBUTRIN XL)  busPIRone (BUSPAR) if needed escitalopram (LEXAPRO)  levothyroxine (SYNTHROID)  metoprolol succinate (TOPROL-XL)  QULIPTA    No Alcohol for 24 hours before or after surgery.  No Smoking including e-cigarettes for 24 hours before surgery.  No chewable tobacco products for at least 6 hours before surgery.  No nicotine patches on the day of surgery.  Do not use any "recreational" drugs for at least a week (preferably 2 weeks) before your surgery.  Please be advised that the combination of cocaine and anesthesia may have negative outcomes, up to and including death. If you test positive for cocaine, your surgery will be cancelled.  On the morning of surgery brush your  teeth with toothpaste and water, you may rinse your mouth with mouthwash if you wish. Do not swallow any toothpaste or mouthwash.  Use CHG Soap or wipes as directed on instruction sheet.  Do not wear jewelry, make-up, hairpins, clips or nail polish.  Do not wear lotions, powders, or perfumes.   Do not shave body hair from the neck down 48 hours before surgery.  Contact lenses, hearing aids and dentures may not be worn into surgery.  Do not bring valuables to the hospital. [facility] is not responsible for any missing/lost belongings or valuables.   Notify your doctor if there is any change in your medical condition (cold, fever, infection).  Wear comfortable clothing (specific to your surgery type) to the hospital.  After surgery, you can help prevent lung complications by doing breathing exercises.  Take deep breaths and cough every 1-2 hours. Your doctor may order a device called an Incentive Spirometer to help you take deep breaths. When coughing or sneezing, hold a pillow firmly against your incision with both hands. This is called "splinting." Doing this helps protect your incision. It also decreases belly discomfort.  If you are being admitted to the hospital overnight, leave your suitcase in the car. After surgery it may be brought to your room.  In case of increased patient census, it may be necessary for you, the patient, to continue your postoperative care in the Same Day Surgery department.  If you are being discharged the day of surgery, you will not be allowed to drive home. You will need  a responsible individual to drive you home and stay with you for 24 hours after surgery.   If you are taking public transportation, you will need to have a responsible individual with you.  Please call the Pre-admissions Testing Dept. at [phone] if you have any questions about these instructions.  Surgery Visitation Policy:  Patients having surgery or a procedure may have two  visitors.  Children under the age of 52 must have an adult with them who is not the patient.  Inpatient Visitation:    Visiting hours are 7 a.m. to 8 p.m. Up to four visitors are allowed at one time in a patient room. The visitors may rotate out with other people during the day.  One visitor age 66 or older may stay with the patient overnight and must be in the room by 8 p.m.    Preparing for Surgery with CHLORHEXIDINE GLUCONATE (CHG) Soap  Chlorhexidine Gluconate (CHG) Soap  o An antiseptic cleaner that kills germs and bonds with the skin to continue killing germs even after washing  o Used for showering the night before surgery and morning of surgery  Before surgery, you can play an important role by reducing the number of germs on your skin.  CHG (Chlorhexidine gluconate) soap is an antiseptic cleanser which kills germs and bonds with the skin to continue killing germs even after washing.  Please do not use if you have an allergy to CHG or antibacterial soaps. If your skin becomes reddened/irritated stop using the CHG.  1. Shower the NIGHT BEFORE SURGERY and the MORNING OF SURGERY with CHG soap.  2. If you choose to wash your hair, wash your hair first as usual with your normal shampoo.  3. After shampooing, rinse your hair and body thoroughly to remove the shampoo.  4. Use CHG as you would any other liquid soap. You can apply CHG directly to the skin and wash gently with a scrungie or a clean washcloth.  5. Apply the CHG soap to your body only from the neck down. Do not use on open wounds or open sores. Avoid contact with your eyes, ears, mouth, and genitals (private parts). Wash face and genitals (private parts) with your normal soap.  6. Wash thoroughly, paying special attention to the area where your surgery will be performed.  7. Thoroughly rinse your body with warm water.  8. Do not shower/wash with your normal soap after using and rinsing off the CHG soap.  9. Pat  yourself dry with a clean towel.  10. Wear clean pajamas to bed the night before surgery.  12. Place clean sheets on your bed the night of your first shower and do not sleep with pets.  13. Shower again with the CHG soap on the day of surgery prior to arriving at the hospital.  14. Do not apply any deodorants/lotions/powders.  15. Please wear clean clothes to the hospital.

## 2023-01-26 LAB — GI PROFILE, STOOL, PCR
Plesiomonas shigelloides: NOT DETECTED
Vibrio cholerae: NOT DETECTED

## 2023-01-26 LAB — CALPROTECTIN, FECAL

## 2023-01-26 LAB — C DIFFICILE, CYTOTOXIN B

## 2023-01-28 ENCOUNTER — Ambulatory Visit: Payer: BC Managed Care – PPO | Admitting: Family Medicine

## 2023-01-28 ENCOUNTER — Encounter
Admission: RE | Admit: 2023-01-28 | Discharge: 2023-01-28 | Disposition: A | Discharge status: Home / Self Care | Payer: BC Managed Care – PPO | Source: Ambulatory Visit | Attending: Surgery | Admitting: Surgery

## 2023-01-28 DIAGNOSIS — E039 Hypothyroidism, unspecified: Secondary | ICD-10-CM | POA: Diagnosis not present

## 2023-01-28 DIAGNOSIS — I10 Essential (primary) hypertension: Secondary | ICD-10-CM | POA: Insufficient documentation

## 2023-01-28 DIAGNOSIS — G4730 Sleep apnea, unspecified: Secondary | ICD-10-CM | POA: Diagnosis not present

## 2023-01-28 DIAGNOSIS — Z87891 Personal history of nicotine dependence: Secondary | ICD-10-CM | POA: Diagnosis not present

## 2023-01-28 DIAGNOSIS — Z79890 Hormone replacement therapy (postmenopausal): Secondary | ICD-10-CM | POA: Diagnosis not present

## 2023-01-28 DIAGNOSIS — F909 Attention-deficit hyperactivity disorder, unspecified type: Secondary | ICD-10-CM | POA: Diagnosis not present

## 2023-01-28 DIAGNOSIS — K219 Gastro-esophageal reflux disease without esophagitis: Secondary | ICD-10-CM | POA: Diagnosis not present

## 2023-01-28 DIAGNOSIS — Z01810 Encounter for preprocedural cardiovascular examination: Secondary | ICD-10-CM | POA: Diagnosis not present

## 2023-01-28 DIAGNOSIS — R222 Localized swelling, mass and lump, trunk: Secondary | ICD-10-CM | POA: Diagnosis not present

## 2023-01-28 DIAGNOSIS — F32A Depression, unspecified: Secondary | ICD-10-CM | POA: Diagnosis not present

## 2023-01-28 DIAGNOSIS — F419 Anxiety disorder, unspecified: Secondary | ICD-10-CM | POA: Diagnosis not present

## 2023-01-28 DIAGNOSIS — Z79899 Other long term (current) drug therapy: Secondary | ICD-10-CM | POA: Diagnosis not present

## 2023-01-29 MED ORDER — LACTATED RINGERS IV SOLN: INTRAVENOUS | Status: DC

## 2023-01-29 MED ORDER — CHLORHEXIDINE GLUCONATE 0.12 % MT SOLN
15.0000 mL | Freq: Once | OROMUCOSAL | Status: AC
  Administered 2023-01-30: 15 mL via OROMUCOSAL

## 2023-01-29 MED ORDER — CHLORHEXIDINE GLUCONATE CLOTH 2 % EX PADS: 6.0000 | MEDICATED_PAD | Freq: Once | CUTANEOUS | Status: DC

## 2023-01-29 MED ORDER — CHLORHEXIDINE GLUCONATE CLOTH 2 % EX PADS
6.0000 | MEDICATED_PAD | Freq: Once | CUTANEOUS | Status: AC
  Administered 2023-01-30: 6 via TOPICAL

## 2023-01-29 MED ORDER — ORAL CARE MOUTH RINSE: 15.0000 mL | Freq: Once | OROMUCOSAL | Status: AC

## 2023-01-29 MED ORDER — BUPIVACAINE LIPOSOME 1.3 % IJ SUSP: 20.0000 mL | Freq: Once | INTRAMUSCULAR | Status: DC

## 2023-01-29 MED ORDER — ACETAMINOPHEN 500 MG PO TABS
1000.0000 mg | ORAL_TABLET | ORAL | Status: AC
  Administered 2023-01-30: 1000 mg via ORAL

## 2023-01-29 MED ORDER — GABAPENTIN 300 MG PO CAPS
300.0000 mg | ORAL_CAPSULE | ORAL | Status: AC
  Administered 2023-01-30: 300 mg via ORAL

## 2023-01-29 MED ORDER — CEFAZOLIN SODIUM-DEXTROSE 2-4 GM/100ML-% IV SOLN: 2.0000 g | INTRAVENOUS | Status: DC

## 2023-01-30 ENCOUNTER — Encounter: Payer: Self-pay | Admitting: Surgery

## 2023-01-30 ENCOUNTER — Encounter
Admission: RE | Disposition: A | Discharge status: Home / Self Care | Payer: Self-pay | Source: Home / Self Care | Attending: Surgery

## 2023-01-30 ENCOUNTER — Ambulatory Visit: Payer: BC Managed Care – PPO | Admitting: Urgent Care

## 2023-01-30 ENCOUNTER — Ambulatory Visit
Admission: RE | Admit: 2023-01-30 | Discharge: 2023-01-30 | Disposition: A | Discharge status: Home / Self Care | Payer: BC Managed Care – PPO | Attending: Surgery | Admitting: Surgery

## 2023-01-30 ENCOUNTER — Other Ambulatory Visit: Payer: Self-pay

## 2023-01-30 DIAGNOSIS — Z01812 Encounter for preprocedural laboratory examination: Secondary | ICD-10-CM

## 2023-01-30 DIAGNOSIS — Z79890 Hormone replacement therapy (postmenopausal): Secondary | ICD-10-CM | POA: Diagnosis not present

## 2023-01-30 DIAGNOSIS — R222 Localized swelling, mass and lump, trunk: Secondary | ICD-10-CM | POA: Insufficient documentation

## 2023-01-30 DIAGNOSIS — K219 Gastro-esophageal reflux disease without esophagitis: Secondary | ICD-10-CM | POA: Diagnosis not present

## 2023-01-30 DIAGNOSIS — Z87891 Personal history of nicotine dependence: Secondary | ICD-10-CM | POA: Insufficient documentation

## 2023-01-30 DIAGNOSIS — F909 Attention-deficit hyperactivity disorder, unspecified type: Secondary | ICD-10-CM | POA: Diagnosis not present

## 2023-01-30 DIAGNOSIS — Z79899 Other long term (current) drug therapy: Secondary | ICD-10-CM | POA: Diagnosis not present

## 2023-01-30 DIAGNOSIS — F32A Depression, unspecified: Secondary | ICD-10-CM | POA: Diagnosis not present

## 2023-01-30 DIAGNOSIS — E039 Hypothyroidism, unspecified: Secondary | ICD-10-CM | POA: Insufficient documentation

## 2023-01-30 DIAGNOSIS — I10 Essential (primary) hypertension: Secondary | ICD-10-CM | POA: Insufficient documentation

## 2023-01-30 DIAGNOSIS — F419 Anxiety disorder, unspecified: Secondary | ICD-10-CM | POA: Insufficient documentation

## 2023-01-30 DIAGNOSIS — M7989 Other specified soft tissue disorders: Secondary | ICD-10-CM

## 2023-01-30 DIAGNOSIS — Z01810 Encounter for preprocedural cardiovascular examination: Secondary | ICD-10-CM | POA: Insufficient documentation

## 2023-01-30 DIAGNOSIS — G4730 Sleep apnea, unspecified: Secondary | ICD-10-CM | POA: Diagnosis not present

## 2023-01-30 HISTORY — PX: MASS EXCISION: SHX2000

## 2023-01-30 LAB — POCT PREGNANCY, URINE: Preg Test, Ur: NEGATIVE

## 2023-01-30 SURGERY — EXCISION MASS
Anesthesia: General | Laterality: Right

## 2023-01-30 MED ORDER — PROPOFOL 10 MG/ML IV BOLUS
INTRAVENOUS | Status: DC | PRN
  Administered 2023-01-30: 50 mg via INTRAVENOUS
  Administered 2023-01-30: 200 mg via INTRAVENOUS
  Administered 2023-01-30: 50 mg via INTRAVENOUS

## 2023-01-30 MED ORDER — SUGAMMADEX SODIUM 200 MG/2ML IV SOLN
INTRAVENOUS | Status: DC | PRN
  Administered 2023-01-30: 200 mg via INTRAVENOUS

## 2023-01-30 MED ORDER — BUPIVACAINE-EPINEPHRINE (PF) 0.25% -1:200000 IJ SOLN
INTRAMUSCULAR | Status: DC | PRN
  Administered 2023-01-30: 30 mL

## 2023-01-30 MED ORDER — 0.9 % SODIUM CHLORIDE (POUR BTL) OPTIME
TOPICAL | Status: DC | PRN
  Administered 2023-01-30: 50 mL

## 2023-01-30 MED ORDER — GLYCOPYRROLATE 0.2 MG/ML IJ SOLN
INTRAMUSCULAR | Status: DC | PRN
  Administered 2023-01-30: .2 mg via INTRAVENOUS

## 2023-01-30 MED ORDER — PROPOFOL 500 MG/50ML IV EMUL
INTRAVENOUS | Status: DC | PRN
  Administered 2023-01-30: 165 ug/kg/min via INTRAVENOUS

## 2023-01-30 MED ORDER — IBUPROFEN 800 MG PO TABS
800.0000 mg | ORAL_TABLET | Freq: Three times a day (TID) | ORAL | 0 refills | Status: DC | PRN
Start: 2023-01-30 — End: 2023-03-08

## 2023-01-30 MED ORDER — FENTANYL CITRATE (PF) 100 MCG/2ML IJ SOLN
INTRAMUSCULAR | Status: DC | PRN
  Administered 2023-01-30 (×2): 50 ug via INTRAVENOUS

## 2023-01-30 MED ORDER — SUCCINYLCHOLINE CHLORIDE 200 MG/10ML IV SOSY
PREFILLED_SYRINGE | INTRAVENOUS | Status: DC | PRN
  Administered 2023-01-30: 100 mg via INTRAVENOUS

## 2023-01-30 MED ORDER — ROCURONIUM BROMIDE 100 MG/10ML IV SOLN
INTRAVENOUS | Status: DC | PRN
  Administered 2023-01-30: 30 mg via INTRAVENOUS

## 2023-01-30 MED ORDER — LACTATED RINGERS IV SOLN: INTRAVENOUS | Status: DC | PRN

## 2023-01-30 MED ORDER — BUPIVACAINE HCL (PF) 0.25 % IJ SOLN
INTRAMUSCULAR | Status: AC
  Filled 2023-01-30: qty 30

## 2023-01-30 MED ORDER — CEFAZOLIN SODIUM-DEXTROSE 2-4 GM/100ML-% IV SOLN
INTRAVENOUS | Status: AC
  Filled 2023-01-30: qty 100

## 2023-01-30 MED ORDER — ACETAMINOPHEN 500 MG PO TABS
ORAL_TABLET | ORAL | Status: AC
  Filled 2023-01-30: qty 2

## 2023-01-30 MED ORDER — LIDOCAINE HCL (CARDIAC) PF 100 MG/5ML IV SOSY
PREFILLED_SYRINGE | INTRAVENOUS | Status: DC | PRN
  Administered 2023-01-30: 100 mg via INTRAVENOUS

## 2023-01-30 MED ORDER — ONDANSETRON HCL 4 MG/2ML IJ SOLN
INTRAMUSCULAR | Status: DC | PRN
  Administered 2023-01-30 (×2): 4 mg via INTRAVENOUS

## 2023-01-30 MED ORDER — EPINEPHRINE PF 1 MG/ML IJ SOLN
INTRAMUSCULAR | Status: AC
  Filled 2023-01-30: qty 1

## 2023-01-30 MED ORDER — FENTANYL CITRATE (PF) 100 MCG/2ML IJ SOLN: 25.0000 ug | INTRAMUSCULAR | Status: DC | PRN

## 2023-01-30 MED ORDER — GABAPENTIN 300 MG PO CAPS
ORAL_CAPSULE | ORAL | Status: AC
  Filled 2023-01-30: qty 1

## 2023-01-30 MED ORDER — CHLORHEXIDINE GLUCONATE 0.12 % MT SOLN
OROMUCOSAL | Status: AC
  Filled 2023-01-30: qty 15

## 2023-01-30 MED ORDER — DEXAMETHASONE SODIUM PHOSPHATE 10 MG/ML IJ SOLN
INTRAMUSCULAR | Status: DC | PRN
  Administered 2023-01-30: 10 mg via INTRAVENOUS

## 2023-01-30 MED ORDER — MIDAZOLAM HCL 2 MG/2ML IJ SOLN
INTRAMUSCULAR | Status: DC | PRN
  Administered 2023-01-30: 2 mg via INTRAVENOUS

## 2023-01-30 MED ORDER — MIDAZOLAM HCL 2 MG/2ML IJ SOLN
INTRAMUSCULAR | Status: AC
  Filled 2023-01-30: qty 2

## 2023-01-30 MED ORDER — HYDROCODONE-ACETAMINOPHEN 5-325 MG PO TABS: 1.0000 | ORAL_TABLET | Freq: Four times a day (QID) | ORAL | 0 refills | Status: DC | PRN

## 2023-01-30 MED ORDER — FENTANYL CITRATE (PF) 100 MCG/2ML IJ SOLN
INTRAMUSCULAR | Status: AC
  Filled 2023-01-30: qty 2

## 2023-01-30 MED ORDER — PROPOFOL 1000 MG/100ML IV EMUL
INTRAVENOUS | Status: AC
  Filled 2023-01-30: qty 100

## 2023-01-30 MED ORDER — PHENYLEPHRINE 80 MCG/ML (10ML) SYRINGE FOR IV PUSH (FOR BLOOD PRESSURE SUPPORT)
PREFILLED_SYRINGE | INTRAVENOUS | Status: DC | PRN
  Administered 2023-01-30: 160 ug via INTRAVENOUS

## 2023-01-30 SURGICAL SUPPLY — 31 items
ADH SKN CLS APL DERMABOND .7 (GAUZE/BANDAGES/DRESSINGS) ×1
APL PRP STRL LF DISP 70% ISPRP (MISCELLANEOUS) ×1
BLADE SURG 15 STRL LF DISP TIS (BLADE) ×1 IMPLANT
BLADE SURG 15 STRL SS (BLADE) ×1
CHLORAPREP W/TINT 26 (MISCELLANEOUS) ×1 IMPLANT
DERMABOND ADVANCED .7 DNX12 (GAUZE/BANDAGES/DRESSINGS) ×1 IMPLANT
DRAPE LAPAROTOMY 77X122 PED (DRAPES) ×1 IMPLANT
ELECT CAUTERY BLADE TIP 2.5 (TIP) ×1
ELECT REM PT RETURN 9FT ADLT (ELECTROSURGICAL) ×1
ELECTRODE CAUTERY BLDE TIP 2.5 (TIP) ×1 IMPLANT
ELECTRODE REM PT RTRN 9FT ADLT (ELECTROSURGICAL) ×1 IMPLANT
GAUZE 4X4 16PLY ~~LOC~~+RFID DBL (SPONGE) ×1 IMPLANT
GLOVE ORTHO TXT STRL SZ7.5 (GLOVE) ×1 IMPLANT
GOWN STRL REUS W/ TWL LRG LVL3 (GOWN DISPOSABLE) ×1 IMPLANT
GOWN STRL REUS W/ TWL XL LVL3 (GOWN DISPOSABLE) ×1 IMPLANT
GOWN STRL REUS W/TWL LRG LVL3 (GOWN DISPOSABLE) ×1
GOWN STRL REUS W/TWL XL LVL3 (GOWN DISPOSABLE) ×1
KIT TURNOVER KIT A (KITS) ×1 IMPLANT
MANIFOLD NEPTUNE II (INSTRUMENTS) ×1 IMPLANT
NDL HYPO 22X1.5 SAFETY MO (MISCELLANEOUS) ×1 IMPLANT
NEEDLE HYPO 22X1.5 SAFETY MO (MISCELLANEOUS) ×1 IMPLANT
PACK BASIN MINOR ARMC (MISCELLANEOUS) ×1 IMPLANT
SUT MNCRL 4-0 (SUTURE) ×1
SUT MNCRL 4-0 27XMFL (SUTURE) ×1
SUT VIC AB 3-0 SH 27 (SUTURE) ×1
SUT VIC AB 3-0 SH 27X BRD (SUTURE) ×1 IMPLANT
SUTURE MNCRL 4-0 27XMF (SUTURE) ×1 IMPLANT
SYR 10ML LL (SYRINGE) ×1 IMPLANT
SYR BULB IRRIG 60ML STRL (SYRINGE) ×1 IMPLANT
TRAP FLUID SMOKE EVACUATOR (MISCELLANEOUS) ×1 IMPLANT
WATER STERILE IRR 500ML POUR (IV SOLUTION) ×1 IMPLANT

## 2023-01-30 NOTE — Anesthesia Preprocedure Evaluation (Addendum)
Anesthesia Evaluation  Patient identified by MRN, date of birth, ID band Patient awake    Reviewed: Allergy & Precautions, NPO status , Patient's Chart, lab work & pertinent test results  History of Anesthesia Complications (+) PONV and history of anesthetic complications  Airway Mallampati: III       Dental  (+) Upper Dentures   Pulmonary neg shortness of breath, sleep apnea , neg COPD, neg recent URI, Patient abstained from smoking., former smoker    + decreased breath sounds      Cardiovascular Exercise Tolerance: Good hypertension, Pt. on home beta blockers (-) angina (-) Past MI and (-) Cardiac Stents Normal cardiovascular exam(-) dysrhythmias (-) Valvular Problems/Murmurs     Neuro/Psych  PSYCHIATRIC DISORDERS Anxiety Depression    negative neurological ROS     GI/Hepatic ,GERD  ,,(+)     substance abuse  cocaine use and marijuana use  Endo/Other  diabetesHypothyroidism    Renal/GU negative Renal ROS     Musculoskeletal negative musculoskeletal ROS (+)    Abdominal Normal abdominal exam  (+)   Peds  Hematology  (+) Blood dyscrasia, anemia   Anesthesia Other Findings Past Medical History: No date: ADHD (attention deficit hyperactivity disorder) No date: Anemia No date: Anxiety No date: Anxiety disorder No date: Complication of anesthesia No date: Depression No date: Family history of diabetes mellitus No date: GERD (gastroesophageal reflux disease) 1993: Gestational diabetes No date: Headache No date: HSV (herpes simplex virus) infection No date: Hypertension No date: Hypothyroidism No date: IBS (irritable bowel syndrome) No date: Lab test positive for detection of COVID-19 virus No date: PONV (postoperative nausea and vomiting) No date: Sleep apnea No date: Thyroid disease     Comment:  hypothroidsim   Reproductive/Obstetrics                             Anesthesia  Physical Anesthesia Plan  ASA: 3  Anesthesia Plan: General   Post-op Pain Management:    Induction: Intravenous  PONV Risk Score and Plan: 4 or greater and Propofol infusion, TIVA and Treatment may vary due to age or medical condition  Airway Management Planned: Oral ETT  Additional Equipment:   Intra-op Plan:   Post-operative Plan: Extubation in OR  Informed Consent: I have reviewed the patients History and Physical, chart, labs and discussed the procedure including the risks, benefits and alternatives for the proposed anesthesia with the patient or authorized representative who has indicated his/her understanding and acceptance.       Plan Discussed with: CRNA  Anesthesia Plan Comments:         Anesthesia Quick Evaluation

## 2023-01-30 NOTE — Transfer of Care (Signed)
Immediate Anesthesia Transfer of Care Note  Patient: Pamela Avila  Procedure(s) Performed: EXCISION MASS, soft tissue right buttock (Right)  Patient Location: PACU  Anesthesia Type:General  Level of Consciousness: drowsy and patient cooperative  Airway & Oxygen Therapy: Patient Spontanous Breathing and Patient connected to face mask oxygen  Post-op Assessment: Report given to RN and Post -op Vital signs reviewed and stable  Post vital signs: Reviewed and stable  Last Vitals:  Vitals Value Taken Time  BP 110/77 01/30/23 1230  Temp    Pulse 85 01/30/23 1233  Resp 19 01/30/23 1233  SpO2 100 % 01/30/23 1233  Vitals shown include unvalidated device data.  Last Pain:  Vitals:   01/30/23 0953  TempSrc: Temporal  PainSc: 0-No pain         Complications: No notable events documented.

## 2023-01-30 NOTE — Interval H&P Note (Signed)
History and Physical Interval Note:  01/30/2023 11:08 AM  Pamela Avila  has presented today for surgery, with the diagnosis of mass right buttock 12 cm.  The various methods of treatment have been discussed with the patient and family. After consideration of risks, benefits and other options for treatment, the patient has consented to  Procedure(s): EXCISION MASS, soft tissue right buttock (Right) as a surgical intervention.  The patient's history has been reviewed, patient examined, no change in status, stable for surgery.  I have reviewed the patient's chart and labs.  Questions were answered to the patient's satisfaction.    The right buttock is marked.   Campbell Lerner

## 2023-01-30 NOTE — Anesthesia Procedure Notes (Signed)
Procedure Name: Intubation Date/Time: 01/30/2023 11:33 AM  Performed by: Mohammed Kindle, CRNAPre-anesthesia Checklist: Patient identified, Emergency Drugs available, Suction available and Patient being monitored Patient Re-evaluated:Patient Re-evaluated prior to induction Oxygen Delivery Method: Circle system utilized Preoxygenation: Pre-oxygenation with 100% oxygen Induction Type: IV induction Ventilation: Mask ventilation without difficulty Laryngoscope Size: McGraph and 3 Grade View: Grade I Tube type: Oral Tube size: 6.5 mm Number of attempts: 1 Airway Equipment and Method: Stylet and Oral airway Placement Confirmation: ETT inserted through vocal cords under direct vision, positive ETCO2, breath sounds checked- equal and bilateral and CO2 detector Secured at: 21 cm Tube secured with: Tape Dental Injury: Teeth and Oropharynx as per pre-operative assessment

## 2023-01-30 NOTE — Discharge Instructions (Signed)
AMBULATORY SURGERY  DISCHARGE INSTRUCTIONS   The drugs that you were given will stay in your system until tomorrow so for the next 24 hours you should not:  Drive an automobile Make any legal decisions Drink any alcoholic beverage   You may resume regular meals tomorrow.  Today it is better to start with liquids and gradually work up to solid foods.  You may eat anything you prefer, but it is better to start with liquids, then soup and crackers, and gradually work up to solid foods.   Please notify your doctor immediately if you have any unusual bleeding, trouble breathing, redness and pain at the surgery site, drainage, fever, or pain not relieved by medication.       Please contact your physician with any problems or Same Day Surgery at [phone], Monday through Friday 6 am to 4 pm, or [facility] [place] Main number at [phone].  

## 2023-01-30 NOTE — Op Note (Signed)
Excision of 10+ centimeters of soft tissue mass/lipomatous tissue, deep subfascial location right pelvis hip area.  Pre-operative Diagnosis: Soft tissue mass right buttock, 10 x 14 cm.  Post-operative Diagnosis: same, asymmetric adiposity present,  Surgeon: Campbell Lerner, M.D., [facility]  Anesthesia: General endotracheal  Findings: no distinct mass, extensive lipomatous tissues, apparently conglomerated in this area.  Estimated Blood Loss: 10 mL         Specimens: Multiple globules of lipomatous tissue bluntly dissected from region, no residual mass remains.          Complications: none              Procedure Details  The patient was seen again in the Holding Room. The benefits, complications, treatment options, and expected outcomes were discussed with the patient. The risks of bleeding, infection, recurrence of symptoms, failure to resolve symptoms, unanticipated injury, prosthetic placement, prosthetic infection, any of which could require further surgery were reviewed with the patient. The likelihood of improving the patient's symptoms with return to their baseline status is expected.  The patient and/or family concurred with the proposed plan, giving informed consent.  The patient was taken to Operating Room, identified and the procedure verified.    Prior to the induction of general anesthesia, antibiotic prophylaxis was administered. VTE prophylaxis was in place.  General anesthesia was then administered and tolerated well. After the induction, the patient was positioned in the prone position and the right buttock was prepped with  Chloraprep and draped in the sterile fashion.  A Time Out was held and the above information confirmed.  Local infiltration with quarter percent Marcaine with epinephrine is applied to a laterally placed incision overlying the previously outlined mass area.  This incision was taken through subcutaneous tissues with electrosurgery to ensure adequate hemostasis.   Unfortunately I never encountered a remarkably large distinctive or encapsulated mass, but several average sized conglomeration's of lipomatous tissue.  I cannot say any 1 of these, or any as a whole or playing a role of causing the patient's symptoms.  I explored the area medially in this same tissue plane ensuring care not to disrupt any of the deeper innervation or to approach the underlying musculature.  Repeated palpation could not identify any additional masses aside from this layer of tissue, which was thoroughly explored and removed for the most part.  All this was completed bluntly with very minimal sharp dissection and very minimal use of electrocautery.  We irrigated the wound and confirmed adequate hemostasis.  I palpated once again seeking out any unappreciated soft tissue mass that would have played a role in the presenting mass or cause the symptoms the patient describes. We then irrigated the wound once again, and closed the skin with interrupted 3-0 Vicryl dermal sutures followed by a 4-0 Monocryl subcuticular running suture.  The skin was then sealed with Dermabond and additional local anesthesia was then infiltrated into the region.  Patient was subsequently turned to supine, extubated and transferred recovery room in stable condition.      Campbell Lerner M.D., [facility] 01/30/2023 12:51 PM

## 2023-01-31 ENCOUNTER — Encounter: Payer: Self-pay | Admitting: Surgery

## 2023-01-31 LAB — GI PROFILE, STOOL, PCR
Astrovirus: NOT DETECTED
C difficile toxin A/B: NOT DETECTED
Cyclospora cayetanensis: NOT DETECTED
Enteroaggregative E coli: NOT DETECTED
Enteropathogenic E coli: NOT DETECTED
Enterotoxigenic E coli: NOT DETECTED
Rotavirus A: NOT DETECTED
Salmonella: NOT DETECTED
Sapovirus: NOT DETECTED
Shiga-toxin-producing E coli: NOT DETECTED
Vibrio: NOT DETECTED
Yersinia enterocolitica: NOT DETECTED

## 2023-01-31 LAB — C DIFFICILE, CYTOTOXIN B

## 2023-02-04 ENCOUNTER — Telehealth: Payer: Self-pay

## 2023-02-04 NOTE — Telephone Encounter (Signed)
Patient is calling to reschedule colonoscopy that is schedule for 02/27/2023. Patient states her mom is having surgery that day

## 2023-02-04 NOTE — Telephone Encounter (Signed)
Called patient back and agreed on having her procedure done on 03/20/2023. I then called Trish at the endo unit and she stated that she would switch the patient.

## 2023-02-04 NOTE — Anesthesia Postprocedure Evaluation (Signed)
Anesthesia Post Note  Patient: Pamela Avila  Procedure(s) Performed: EXCISION MASS, soft tissue right buttock (Right)  Patient location during evaluation: PACU Anesthesia Type: General Level of consciousness: awake and alert Pain management: pain level controlled Vital Signs Assessment: post-procedure vital signs reviewed and stable Respiratory status: spontaneous breathing, nonlabored ventilation, respiratory function stable and patient connected to nasal cannula oxygen Cardiovascular status: blood pressure returned to baseline and stable Postop Assessment: no apparent nausea or vomiting Anesthetic complications: no   No notable events documented.   Last Vitals:  Vitals:   01/30/23 1300 01/30/23 1315  BP:    Pulse:  91  Resp:  18  Temp: (!) 36.2 C 36.7 C  SpO2:  94%    Last Pain:  Vitals:   01/30/23 1315  TempSrc: Temporal  PainSc: 0-No pain                 Lenard Simmer

## 2023-02-11 DIAGNOSIS — R7309 Other abnormal glucose: Secondary | ICD-10-CM | POA: Diagnosis not present

## 2023-02-12 ENCOUNTER — Encounter: Payer: Self-pay | Admitting: Physician Assistant

## 2023-02-12 ENCOUNTER — Ambulatory Visit (INDEPENDENT_AMBULATORY_CARE_PROVIDER_SITE_OTHER): Payer: BC Managed Care – PPO | Admitting: Physician Assistant

## 2023-02-12 VITALS — BP 98/59 | HR 96 | Temp 97.5°F | Ht 61.0 in | Wt 175.8 lb

## 2023-02-12 DIAGNOSIS — M7989 Other specified soft tissue disorders: Secondary | ICD-10-CM

## 2023-02-12 DIAGNOSIS — Z09 Encounter for follow-up examination after completed treatment for conditions other than malignant neoplasm: Secondary | ICD-10-CM

## 2023-02-12 DIAGNOSIS — Z9889 Other specified postprocedural states: Secondary | ICD-10-CM

## 2023-02-12 NOTE — Patient Instructions (Signed)
Excision of Skin Lesions, Care After The following information offers guidance on how to care for yourself after your procedure. Your health care provider may also give you more specific instructions. If you have problems or questions, contact your health care provider. What can I expect after the procedure? After your procedure, it is common to have: Soreness or mild pain. Some redness and swelling. Follow these instructions at home: Excision site care  Follow instructions from your health care provider about how to take care of your excision site. Make sure you: Wash your hands with soap and water for at least 20 seconds before and after you change your bandage (dressing). If soap and water are not available, use hand sanitizer. Change your dressing as told by your health care provider. Leave stitches (sutures), skin glue, or adhesive strips in place. These skin closures may need to stay in place for 2 weeks or longer. If adhesive strip edges start to loosen and curl up, you may trim the loose edges. Do not remove adhesive strips completely unless your health care provider tells you to do that. Check the excision area every day for signs of infection. Watch for: More redness, swelling, or pain. Fluid or blood. Warmth. Pus or a bad smell. Keep the site clean, dry, and protected for at least 48 hours. For bleeding, apply gentle but firm pressure to the area using a folded towel for 20 minutes. Do not take baths, swim, or use a hot tub until your health care provider approves. Ask your health care provider if you may take showers. You may only be allowed to take sponge baths. General instructions Take over-the-counter and prescription medicines only as told by your health care provider. Follow instructions from your health care provider about how to minimize scarring. Scarring should lessen over time. Avoid sun exposure until the area has healed. Use sunscreen to protect the area from the sun  after it has healed. Avoid high-impact exercise and activities until the sutures are removed or the area heals. Keep all follow-up visits. This is important. Contact a health care provider if: You have more redness, swelling, or pain around your excision site. You have fluid or blood coming from your excision site. Your excision site feels warm to the touch. You have pus or a bad smell coming from your excision site. You have a fever. You have pain that does not improve in 2-3 days after your procedure. Get help right away if: You have bleeding that does not stop with pressure or a dressing. Your wound opens up. Summary Take over-the-counter and prescription medicines only as told by your health care provider. Change your dressing as told by your health care provider. Contact a health care provider if you have redness, swelling, pain, or other signs of infection around your excision site. Keep all follow-up visits. This is important. This information is not intended to replace advice given to you by your health care provider. Make sure you discuss any questions you have with your health care provider. Document Revised: 02/28/2021 Document Reviewed: 02/28/2021 Elsevier Patient Education  2024 [employer]  

## 2023-02-12 NOTE — Progress Notes (Signed)
[facility] POST-OP OFFICE VISIT  02/12/2023  HPI: Pamela Avila is a 52 y.o. female 13 days s/p excision of left hip mass/lipoma (10+ cm) with Dr Claudine Mouton   From a surgical perspective, she is doing well. Area is sore expectedly but did not need pain medications after a few days. No fever, chills. Incision itself is well healed without erythema. No reports of additional swelling.   Of note, she is frustrated because she feels like the surgeon did "not go in the right area." She felt that the incision was made over area of concern but tissue removed was superior to this. She feels like "the right thing was not done."  Vital signs: BP (!) 98/59   Pulse 96   Temp (!) 97.5 F (36.4 C) (Oral)   Ht 5\' 1"  (1.549 m)   Wt 175 lb 12.8 oz (79.7 kg)   BMI 33.22 kg/m    Physical Exam: Constitutional: Well appearing female, NAD Skin: Incision to the right inferolateral buttock is healing well; dermabond is flaking off, no erythema, no ecchymosis, no drainage. The area does appear mildly swollen; question underlying seroma. Noted patient's concern in history but I unfortunately do not have a pre-operative examination to compare to.   Assessment/Plan: This is a 52 y.o. female 13 days s/p excision of left hip mass/lipoma (10+ cm) with Dr Claudine Mouton    - Overall, I do think she is recovering from this procedure without incident. There may be some degree of swelling in this area expected after resection of large amount of fatty tissue. May have a small seroma. Certainly no infection, abscess, nor hematoma. She understands and is appreciative.   - I did discuss her concerns over this surgery. She is concerned and feels like "the wrong thing was done." I did offer her a chance to return to Dr Claudine Mouton to discuss this but she wishes to seek another MD for further assessment and possible additional resection. I think this is reasonable and will arrange this. I will make this appointment about  4 weeks from now to ensure she has adequate time to heal from this surgery. She is in agreement with this pain.   - Pain control prn; OTC modalities should be sufficient  - Reviewed wound care   - I will be happy to see her in the interim as needed   -- Lynden Oxford, PA-C [facility] 02/12/2023, 3:56 PM M-F: 7am - 4pm

## 2023-03-06 DIAGNOSIS — S060X1A Concussion with loss of consciousness of 30 minutes or less, initial encounter: Secondary | ICD-10-CM | POA: Diagnosis not present

## 2023-03-08 ENCOUNTER — Other Ambulatory Visit (HOSPITAL_COMMUNITY)
Admission: RE | Admit: 2023-03-08 | Discharge: 2023-03-08 | Disposition: A | Discharge status: Home / Self Care | Payer: BC Managed Care – PPO | Source: Ambulatory Visit | Attending: Family Medicine | Admitting: Family Medicine

## 2023-03-08 ENCOUNTER — Ambulatory Visit (INDEPENDENT_AMBULATORY_CARE_PROVIDER_SITE_OTHER): Payer: BC Managed Care – PPO | Admitting: Family Medicine

## 2023-03-08 ENCOUNTER — Encounter: Payer: Self-pay | Admitting: Family Medicine

## 2023-03-08 VITALS — BP 112/82 | HR 109 | Temp 98.2°F | Wt 176.0 lb

## 2023-03-08 DIAGNOSIS — D485 Neoplasm of uncertain behavior of skin: Secondary | ICD-10-CM | POA: Insufficient documentation

## 2023-03-08 DIAGNOSIS — I10 Essential (primary) hypertension: Secondary | ICD-10-CM | POA: Diagnosis not present

## 2023-03-08 DIAGNOSIS — E038 Other specified hypothyroidism: Secondary | ICD-10-CM | POA: Diagnosis not present

## 2023-03-08 MED ORDER — METOPROLOL SUCCINATE ER 50 MG PO TB24: 50.0000 mg | ORAL_TABLET | Freq: Every day | ORAL | 1 refills | Status: DC

## 2023-03-08 NOTE — Assessment & Plan Note (Signed)
BP great today. Neurology would like to increase her metoprolol for headaches, so we will stop lisinopril and increase her metoprolol to 50mg  daily. Recheck 1 month. Call with any concerns.

## 2023-03-08 NOTE — Assessment & Plan Note (Signed)
Rechecking labs today. Await results. Treat as needed.  °

## 2023-03-08 NOTE — Progress Notes (Signed)
BP 112/82   Pulse (!) 109   Temp 98.2 F (36.8 C) (Oral)   Wt 176 lb (79.8 kg)   SpO2 97%   BMI 33.25 kg/m    Subjective:    Patient ID: Pamela Avila, female    DOB: May 14, 1971, 52 y.o.   MRN: [id number]  HPI: Pamela Avila is a 52 y.o. female  Chief Complaint  Patient presents with   Hypotension   HYPERTENSION  Hypertension status: controlled  Satisfied with current treatment? yes Duration of hypertension: chronic BP monitoring frequency:  not checking BP medication side effects:  no Medication compliance: excellent compliance Previous BP meds:metoprolol, lisinopril Aspirin: no Recurrent headaches: yes Visual changes: no Palpitations: no Dyspnea: no Chest pain: no Lower extremity edema: no Dizzy/lightheaded: yes  HYPOTHYROIDISM Thyroid control status:stable Satisfied with current treatment? yes Medication side effects: no Medication compliance: excellent compliance Recent dose adjustment:yes Fatigue: yes Cold intolerance: no Heat intolerance: no Weight gain: no Weight loss: no Constipation: no Diarrhea/loose stools: no Palpitations: yes Lower extremity edema: no Anxiety/depressed mood: no  SKIN LESION Duration: chronic Location: middle back Painful: no Itching: yes Onset: gradual Context: bigger Associated signs and symptoms: none   Relevant past medical, surgical, family and social history reviewed and updated as indicated. Interim medical history since our last visit reviewed. Allergies and medications reviewed and updated.  Review of Systems  Constitutional: Negative.   Respiratory: Negative.    Cardiovascular: Negative.   Gastrointestinal: Negative.   Musculoskeletal: Negative.   Skin: Negative.   Psychiatric/Behavioral: Negative.      Per HPI unless specifically indicated above     Objective:    BP 112/82   Pulse (!) 109   Temp 98.2 F (36.8 C) (Oral)   Wt 176 lb (79.8 kg)   SpO2 97%   BMI 33.25 kg/m   Wt Readings from  Last 3 Encounters:  03/08/23 176 lb (79.8 kg)  02/12/23 175 lb 12.8 oz (79.7 kg)  01/30/23 177 lb (80.3 kg)    Physical Exam Vitals and nursing note reviewed.  Constitutional:      General: She is not in acute distress.    Appearance: Normal appearance. She is not ill-appearing, toxic-appearing or diaphoretic.  HENT:     Head: Normocephalic and atraumatic.     Right Ear: External ear normal.     Left Ear: External ear normal.     Nose: Nose normal.     Mouth/Throat:     Mouth: Mucous membranes are moist.     Pharynx: Oropharynx is clear.  Eyes:     General: No scleral icterus.       Right eye: No discharge.        Left eye: No discharge.     Extraocular Movements: Extraocular movements intact.     Conjunctiva/sclera: Conjunctivae normal.     Pupils: Pupils are equal, round, and reactive to light.  Cardiovascular:     Rate and Rhythm: Normal rate and regular rhythm.     Pulses: Normal pulses.     Heart sounds: Normal heart sounds. No murmur heard.    No friction rub. No gallop.  Pulmonary:     Effort: Pulmonary effort is normal. No respiratory distress.     Breath sounds: Normal breath sounds. No stridor. No wheezing, rhonchi or rales.  Chest:     Chest wall: No tenderness.  Musculoskeletal:        General: Normal range of motion.     Cervical back: Normal  range of motion and neck supple.  Skin:    General: Skin is warm and dry.     Capillary Refill: Capillary refill takes less than 2 seconds.     Coloration: Skin is not jaundiced or pale.     Findings: No bruising, erythema, lesion or rash.     Comments: 0.75cm pedunculated lesion in center of upper back  Neurological:     General: No focal deficit present.     Mental Status: She is alert and oriented to person, place, and time. Mental status is at baseline.  Psychiatric:        Mood and Affect: Mood normal.        Behavior: Behavior normal.        Thought Content: Thought content normal.        Judgment: Judgment  normal.     Results for orders placed or performed during the hospital encounter of 01/30/23  Pregnancy, urine POC  Result Value Ref Range   Preg Test, Ur NEGATIVE NEGATIVE      Assessment & Plan:   Problem List Items Addressed This Visit       Cardiovascular and Mediastinum   Benign essential hypertension - Primary    BP great today. Neurology would like to increase her metoprolol for headaches, so we will stop lisinopril and increase her metoprolol to 50mg  daily. Recheck 1 month. Call with any concerns.       Relevant Medications   metoprolol succinate (TOPROL-XL) 50 MG 24 hr tablet     Endocrine   Hypothyroidism    Rechecking labs today. Await results. Treat as needed.      Relevant Medications   metoprolol succinate (TOPROL-XL) 50 MG 24 hr tablet   Other Visit Diagnoses     Neoplasm of uncertain behavior of skin       Removed today as below.   Relevant Orders   Surgical pathology       Skin Procedure  Procedure: Informed consent given.  Sterile prep of the area.  Area infiltrated with lidocaine without epinephrine.  Using a surgical blade, part of the upper dermis shaved off and sent  for pathology.  Area cauterized. Pt ed on scarring.  Diagnosis:   ICD-10-CM   1. Benign essential hypertension  I10     2. Other specified hypothyroidism  E03.8 TSH    3. Neoplasm of uncertain behavior of skin  D48.5 Surgical pathology   Removed today as below.      Lesion Location/Size: 0.75cm pedunculated lesion in center of upper back Physician: MJ Consent:  Risks, benefits, and alternative treatments discussed and all questions were answered.  Patient elected to proceed and verbal consent obtained.  Description: Area prepped and draped using semi-sterile technique. Area locally anesthetized using 3 cc's of lidocaine 1% plain. Shave biopsy of lesion performed using a dermablade.  Adequate hemostastis achieved using Silver Nitrate. Wound dressed after application of  bacitracin ointment  Post Procedure Instructions: Wound care instructions discussed and patient was instructed to keep area clean and dry.  Signs and symptoms of infection discussed, patient agrees to contact the office ASAP should they occur.  Dressing change recommended every other day.  Follow up plan: Return in about 4 weeks (around 04/05/2023).

## 2023-03-10 ENCOUNTER — Encounter: Payer: Self-pay | Admitting: Family Medicine

## 2023-03-14 DIAGNOSIS — R7309 Other abnormal glucose: Secondary | ICD-10-CM | POA: Diagnosis not present

## 2023-03-15 ENCOUNTER — Other Ambulatory Visit: Payer: Self-pay | Admitting: Family Medicine

## 2023-03-15 DIAGNOSIS — E038 Other specified hypothyroidism: Secondary | ICD-10-CM

## 2023-03-15 MED ORDER — LEVOTHYROXINE SODIUM 88 MCG PO TABS: 88.0000 ug | ORAL_TABLET | Freq: Every day | ORAL | 1 refills | Status: DC

## 2023-03-15 NOTE — Progress Notes (Signed)
Called and LVM to call office back for 6 week lab only visit.

## 2023-03-18 ENCOUNTER — Ambulatory Visit: Payer: BC Managed Care – PPO | Admitting: Surgery

## 2023-03-18 ENCOUNTER — Telehealth: Payer: Self-pay | Admitting: Gastroenterology

## 2023-03-18 ENCOUNTER — Encounter: Payer: Self-pay | Admitting: Gastroenterology

## 2023-03-18 NOTE — Telephone Encounter (Signed)
Instructions sent via MyChart per patient's request.

## 2023-03-18 NOTE — Telephone Encounter (Signed)
Patient stated that she unable to find her instruction and would like them to be resent or email.

## 2023-03-19 ENCOUNTER — Encounter: Payer: Self-pay | Admitting: Gastroenterology

## 2023-03-20 ENCOUNTER — Encounter
Admission: RE | Disposition: A | Discharge status: Home / Self Care | Payer: Self-pay | Source: Home / Self Care | Attending: Gastroenterology

## 2023-03-20 ENCOUNTER — Ambulatory Visit
Admission: RE | Admit: 2023-03-20 | Discharge: 2023-03-20 | Disposition: A | Discharge status: Home / Self Care | Payer: BC Managed Care – PPO | Attending: Gastroenterology | Admitting: Gastroenterology

## 2023-03-20 ENCOUNTER — Ambulatory Visit: Payer: BC Managed Care – PPO | Admitting: Anesthesiology

## 2023-03-20 ENCOUNTER — Ambulatory Visit: Payer: BC Managed Care – PPO | Admitting: Urgent Care

## 2023-03-20 DIAGNOSIS — Z87891 Personal history of nicotine dependence: Secondary | ICD-10-CM | POA: Insufficient documentation

## 2023-03-20 DIAGNOSIS — I10 Essential (primary) hypertension: Secondary | ICD-10-CM | POA: Insufficient documentation

## 2023-03-20 DIAGNOSIS — E039 Hypothyroidism, unspecified: Secondary | ICD-10-CM | POA: Diagnosis not present

## 2023-03-20 DIAGNOSIS — R197 Diarrhea, unspecified: Secondary | ICD-10-CM

## 2023-03-20 DIAGNOSIS — G4730 Sleep apnea, unspecified: Secondary | ICD-10-CM | POA: Diagnosis not present

## 2023-03-20 DIAGNOSIS — E119 Type 2 diabetes mellitus without complications: Secondary | ICD-10-CM | POA: Insufficient documentation

## 2023-03-20 DIAGNOSIS — K529 Noninfective gastroenteritis and colitis, unspecified: Secondary | ICD-10-CM | POA: Insufficient documentation

## 2023-03-20 DIAGNOSIS — K580 Irritable bowel syndrome with diarrhea: Secondary | ICD-10-CM

## 2023-03-20 DIAGNOSIS — R159 Full incontinence of feces: Secondary | ICD-10-CM

## 2023-03-20 HISTORY — PX: BIOPSY: SHX5522

## 2023-03-20 HISTORY — PX: COLONOSCOPY WITH PROPOFOL: SHX5780

## 2023-03-20 LAB — GLUCOSE, CAPILLARY: Glucose-Capillary: 111 mg/dL — ABNORMAL HIGH (ref 70–99)

## 2023-03-20 LAB — POCT PREGNANCY, URINE: Preg Test, Ur: NEGATIVE

## 2023-03-20 SURGERY — COLONOSCOPY WITH PROPOFOL
Anesthesia: General

## 2023-03-20 MED ORDER — SODIUM CHLORIDE 0.9 % IV SOLN
INTRAVENOUS | Status: DC
  Administered 2023-03-20: 20 mL/h via INTRAVENOUS

## 2023-03-20 MED ORDER — PROPOFOL 10 MG/ML IV BOLUS
INTRAVENOUS | Status: AC
  Filled 2023-03-20: qty 40

## 2023-03-20 MED ORDER — LIDOCAINE HCL (CARDIAC) PF 100 MG/5ML IV SOSY
PREFILLED_SYRINGE | INTRAVENOUS | Status: DC | PRN
  Administered 2023-03-20: 50 mg via INTRAVENOUS

## 2023-03-20 MED ORDER — PROPOFOL 10 MG/ML IV BOLUS
INTRAVENOUS | Status: DC | PRN
Start: 2023-03-20 — End: 2023-03-20
  Administered 2023-03-20: 80 mg via INTRAVENOUS
  Administered 2023-03-20: 50 mg via INTRAVENOUS
  Administered 2023-03-20: 20 mg via INTRAVENOUS
  Administered 2023-03-20: 50 mg via INTRAVENOUS

## 2023-03-20 MED ORDER — PROPOFOL 500 MG/50ML IV EMUL
INTRAVENOUS | Status: DC | PRN
  Administered 2023-03-20: 100 ug/kg/min via INTRAVENOUS

## 2023-03-20 MED ORDER — ONDANSETRON HCL 4 MG/2ML IJ SOLN
INTRAMUSCULAR | Status: AC
  Filled 2023-03-20: qty 2

## 2023-03-20 NOTE — Anesthesia Preprocedure Evaluation (Signed)
Anesthesia Evaluation  Patient identified by MRN, date of birth, ID band Patient awake    Reviewed: Allergy & Precautions, NPO status , Patient's Chart, lab work & pertinent test results  History of Anesthesia Complications (+) PONV and history of anesthetic complications  Airway Mallampati: II  TM Distance: >3 FB Neck ROM: Full    Dental no notable dental hx. (+) Teeth Intact   Pulmonary sleep apnea , neg COPD, Patient abstained from smoking.Not current smoker, former smoker   Pulmonary exam normal breath sounds clear to auscultation       Cardiovascular Exercise Tolerance: Good METShypertension, Pt. on medications (-) CAD and (-) Past MI (-) dysrhythmias  Rhythm:Regular Rate:Normal - Systolic murmurs    Neuro/Psych  Headaches PSYCHIATRIC DISORDERS Anxiety Depression       GI/Hepatic ,GERD  Medicated and Controlled,,(+)     (-) substance abuse    Endo/Other  diabetesHypothyroidism    Renal/GU negative Renal ROS     Musculoskeletal   Abdominal   Peds  Hematology   Anesthesia Other Findings Past Medical History: No date: ADHD (attention deficit hyperactivity disorder) No date: Anemia No date: Anxiety No date: Anxiety disorder No date: Complication of anesthesia No date: Depression No date: Family history of diabetes mellitus No date: GERD (gastroesophageal reflux disease) 1993: Gestational diabetes No date: Headache No date: HSV (herpes simplex virus) infection No date: Hypertension No date: Hypothyroidism No date: IBS (irritable bowel syndrome) No date: Lab test positive for detection of COVID-19 virus No date: PONV (postoperative nausea and vomiting) No date: Sleep apnea No date: Thyroid disease     Comment:  hypothroidsim  Reproductive/Obstetrics                             Anesthesia Physical Anesthesia Plan  ASA: 2  Anesthesia Plan: General   Post-op Pain  Management: Minimal or no pain anticipated   Induction: Intravenous  PONV Risk Score and Plan: 3 and Propofol infusion, TIVA and Ondansetron  Airway Management Planned: Nasal Cannula  Additional Equipment: None  Intra-op Plan:   Post-operative Plan:   Informed Consent: I have reviewed the patients History and Physical, chart, labs and discussed the procedure including the risks, benefits and alternatives for the proposed anesthesia with the patient or authorized representative who has indicated his/her understanding and acceptance.     Dental advisory given  Plan Discussed with: CRNA and Surgeon  Anesthesia Plan Comments: (Discussed risks of anesthesia with patient, including possibility of difficulty with spontaneous ventilation under anesthesia necessitating airway intervention, PONV, and rare risks such as cardiac or respiratory or neurological events, and allergic reactions. Discussed the role of CRNA in patient's perioperative care. Patient understands.)       Anesthesia Quick Evaluation

## 2023-03-20 NOTE — Anesthesia Postprocedure Evaluation (Signed)
Anesthesia Post Note  Patient: Pamela Avila  Procedure(s) Performed: COLONOSCOPY WITH PROPOFOL BIOPSY  Patient location during evaluation: Endoscopy Anesthesia Type: General Level of consciousness: awake and alert Pain management: pain level controlled Vital Signs Assessment: post-procedure vital signs reviewed and stable Respiratory status: spontaneous breathing, nonlabored ventilation, respiratory function stable and patient connected to nasal cannula oxygen Cardiovascular status: blood pressure returned to baseline and stable Postop Assessment: no apparent nausea or vomiting Anesthetic complications: no   No notable events documented.   Last Vitals:  Vitals:   03/20/23 1006 03/20/23 1016  BP: 101/65 105/75  Pulse: (!) 102 90  Resp: 17 19  Temp:    SpO2: 100% 99%    Last Pain:  Vitals:   03/20/23 1016  TempSrc:   PainSc: 0-No pain                 Corinda Gubler

## 2023-03-20 NOTE — Op Note (Signed)
[facility] Patient Name: Pamela Avila Procedure Date: 03/20/2023 9:34 AM MRN: [id number] Account #: [id number] Date of Birth: 26-Oct-1970 Admit Type: Outpatient Age: 52 Room: [facility] ENDO ROOM 3 Gender: Female Note Status: Finalized Instrument Name: Prentice Docker [id number] Procedure:             Colonoscopy Indications:           Chronic diarrhea Providers:             Wyline Mood MD, MD Referring MD:          Dorcas Carrow (Referring MD) Medicines:             Monitored Anesthesia Care Complications:         No immediate complications. Procedure:             Pre-Anesthesia Assessment:                        - Prior to the procedure, a History and Physical was                         performed, and patient medications, allergies and                         sensitivities were reviewed. The patient's tolerance                         of previous anesthesia was reviewed.                        - The risks and benefits of the procedure and the                         sedation options and risks were discussed with the                         patient. All questions were answered and informed                         consent was obtained.                        - ASA Grade Assessment: II - A patient with mild                         systemic disease.                        After obtaining informed consent, the colonoscope was                         passed under direct vision. Throughout the procedure,                         the patient's blood pressure, pulse, and oxygen                         saturations were monitored continuously. The                         Colonoscope was introduced through the anus  and                         advanced to the the cecum, identified by the                         appendiceal orifice. The colonoscopy was performed                         with ease. The patient tolerated the procedure well.                         The  quality of the bowel preparation was excellent.                         The ileocecal valve, appendiceal orifice, and rectum                         were photographed. Findings:      The perianal and digital rectal examinations were normal.      The colon (entire examined portion) appeared normal. Biopsies for       histology were taken with a cold forceps from the entire colon for       evaluation of microscopic colitis.      The exam was otherwise without abnormality on direct and retroflexion       views. Impression:            - The entire examined colon is normal. Biopsied.                        - The examination was otherwise normal on direct and                         retroflexion views. Recommendation:        - Discharge patient to home (with escort).                        - Resume previous diet.                        - Continue present medications.                        - Await pathology results.                        - Repeat colonoscopy in 10 years for screening                         purposes. Procedure Code(s):     --- Professional ---                        [phone], Colonoscopy, flexible; with biopsy, single or                         multiple Diagnosis Code(s):     --- Professional ---                        K52.9, Noninfective gastroenteritis and colitis,  unspecified CPT copyright 2022 American Medical Association. All rights reserved. The codes documented in this report are preliminary and upon coder review may  be revised to meet current compliance requirements. Wyline Mood, MD Wyline Mood MD, MD 03/20/2023 10:05:16 AM This report has been signed electronically. Number of Addenda: 0 Note Initiated On: 03/20/2023 9:34 AM Scope Withdrawal Time: 0 hours 8 minutes 56 seconds  Total Procedure Duration: 0 hours 12 minutes 17 seconds  Estimated Blood Loss:  Estimated blood loss: none.      [facility]

## 2023-03-20 NOTE — H&P (Signed)
Wyline Mood, MD [address], [address], [place], [place], [address] [address], [place], [place], [address] Phone: [phone]  Fax: [phone]  Primary Care Physician:  Dorcas Carrow, DO   Pre-Procedure History & Physical: HPI:  Pamela Avila is a 52 y.o. female is here for an colonoscopy.   Past Medical History:  Diagnosis Date   ADHD (attention deficit hyperactivity disorder)    Anemia    Anxiety    Anxiety disorder    Complication of anesthesia    Depression    Family history of diabetes mellitus    GERD (gastroesophageal reflux disease)    Gestational diabetes 1993   Headache    HSV (herpes simplex virus) infection    Hypertension    Hypothyroidism    IBS (irritable bowel syndrome)    Lab test positive for detection of COVID-19 virus    PONV (postoperative nausea and vomiting)    Sleep apnea    Thyroid disease    hypothroidsim    Past Surgical History:  Procedure Laterality Date   BREAST EXCISIONAL BIOPSY Right 2010   neg surgical breast bx     BREAST SURGERY Right    lumpectomy   CESAREAN SECTION     x 2   COLONOSCOPY WITH PROPOFOL N/A 12/28/2016   Procedure: COLONOSCOPY WITH PROPOFOL;  Surgeon: Wyline Mood, MD;  Location: [facility] ENDOSCOPY;  Service: Endoscopy;  Laterality: N/A;   CRYOTHERAPY  20 + years ago   cervix   DILITATION & CURRETTAGE/HYSTROSCOPY WITH NOVASURE ABLATION N/A 04/11/2015   Procedure: DILATATION & CURETTAGE/HYSTEROSCOPY WITH NOVASURE ABLATION;  Surgeon: Herold Harms, MD;  Location: [facility] ORS;  Service: Gynecology;  Laterality: N/A;   ESOPHAGOGASTRODUODENOSCOPY (EGD) WITH PROPOFOL N/A 12/28/2016   Procedure: ESOPHAGOGASTRODUODENOSCOPY (EGD) WITH PROPOFOL;  Surgeon: Wyline Mood, MD;  Location: [facility] ENDOSCOPY;  Service: Endoscopy;  Laterality: N/A;   ESSURE TUBAL LIGATION     lumpectomy Right    MASS EXCISION Right 01/30/2023   Procedure: EXCISION MASS, soft tissue right buttock;  Surgeon: Campbell Lerner, MD;   Location: [facility] ORS;  Service: General;  Laterality: Right;   NASAL SEPTOPLASTY W/ TURBINOPLASTY Bilateral 10/07/2018   Procedure: SEPTOPLASTY WITH BILATERAL SUBMUCOUS RESECTION OF INFERIOR TURBINATES;  Surgeon: Linus Salmons, MD;  Location: [facility] ORS;  Service: ENT;  Laterality: Bilateral;   TONSILLECTOMY     TUBAL LIGATION      Prior to Admission medications   Medication Sig Start Date End Date Taking? Authorizing Provider  amphetamine-dextroamphetamine (ADDERALL XR) 20 MG 24 hr capsule Take 40 mg by mouth daily. 12/06/22  Yes [provider]  buPROPion (WELLBUTRIN XL) 150 MG 24 hr tablet TAKE 1 TABLET BY MOUTH EVERY DAY GENERIC EQUIVALENT FOR WELLBUTRIN XL 10/12/22  Yes Johnson, Megan P, DO  clonazePAM (KLONOPIN) 0.5 MG tablet Take 0.5 mg by mouth 2 (two) times daily as needed. 06/27/22  Yes [provider]  dicyclomine (BENTYL) 10 MG capsule Take 1 capsule (10 mg total) by mouth 3 (three) times daily as needed for spasms. 01/08/23  Yes Johnson, Megan P, DO  escitalopram (LEXAPRO) 20 MG tablet Take 20 mg by mouth daily. 12/06/22  Yes [provider]  levothyroxine (SYNTHROID) 88 MCG tablet Take 1 tablet (88 mcg total) by mouth daily. 03/15/23  Yes Johnson, Megan P, DO  meclizine (ANTIVERT) 25 MG tablet Take 1 tablet (25 mg total) by mouth 3 (three) times daily as needed for dizziness. 04/05/20  Yes Johnson, Megan P, DO  metoprolol succinate (TOPROL-XL) 50 MG 24 hr tablet Take 1 tablet (50 mg total) by mouth daily. GENERIC EQUIVALENT FOR TOPROL XL 03/08/23  Yes Johnson, Megan P, DO  naratriptan (AMERGE) 2.5 MG tablet Take 1 tab at onset of migraine. May repeat in 2 hours if needed. Max 2/day and 2-3 days/wk. 10/09/22  Yes [provider]  nortriptyline (PAMELOR) 10 MG capsule Take 50 mg by mouth at bedtime. 05/05/22  Yes [provider]  omeprazole (PRILOSEC) 20 MG capsule TAKE ONE CAPSULE BY MOUTH TWICE DAILY AS NEEDED FOR HEARTBURN/ INDIGESTION. Patient  taking differently: daily. TAKE ONE CAPSULE BY MOUTH TWICE DAILY AS NEEDED FOR HEARTBURN/ INDIGESTION. 01/08/23  Yes Johnson, Megan P, DO  ondansetron (ZOFRAN-ODT) 4 MG disintegrating tablet Take 4 mg by mouth every 8 (eight) hours as needed. 02/09/22  Yes [provider]  QULIPTA 60 MG TABS Take 1 tablet by mouth daily. 12/18/22  Yes [provider]    Allergies as of 01/14/2023 - Review Complete 01/14/2023  Allergen Reaction Noted   Erythromycin Itching 02/01/2015   Bactrim [sulfamethoxazole-trimethoprim] Rash 03/20/2022    Family History  Problem Relation Age of Onset   Hypothyroidism Paternal Grandfather    Breast cancer Paternal Grandmother    Cancer Paternal Grandmother        breast   Breast cancer Maternal Grandmother    Hyperlipidemia Maternal Grandfather    Diabetes Father    Obesity Father    Hypertension Father    Hyperlipidemia Father    COPD Father    Ovarian cancer Paternal Aunt        ?   Cancer Paternal Aunt        breast and cervical   Breast cancer Paternal Aunt    Hypothyroidism Mother    Breast cancer Son    Cancer Son        breast   Colon cancer Neg Hx     Social History   Socioeconomic History   Marital status: Significant Other    Spouse name: JEREMY   Number of children: Not on file   Years of education: Not on file   Highest education level: Not on file  Occupational History   Not on file  Tobacco Use   Smoking status: Former    Types: Cigarettes    Passive exposure: Past   Smokeless tobacco: Never  Vaping Use   Vaping status: Former  Substance and Sexual Activity   Alcohol use: Yes    Alcohol/week: 12.0 standard drinks of alcohol    Types: 12 Standard drinks or equivalent per week   Drug use: Not Currently    Types: Marijuana, Cocaine    Comment: Occasional, hx of cocaine in past   Sexual activity: Yes  Other Topics Concern   Not on file  Social History Narrative   Not on file   Social Determinants of Health    Financial Resource Strain: Not on file  Food Insecurity: Not on file  Transportation Needs: Not on file  Physical Activity: Not on file  Stress: Not on file  Social Connections: Not on file  Intimate Partner Violence: Not on file    Review of Systems: See HPI, otherwise negative ROS  Physical Exam:  General:   Alert,  pleasant and cooperative in NAD Head:  Normocephalic and atraumatic. Neck:  Supple; no masses or thyromegaly. Lungs:  Clear throughout to auscultation, normal respiratory effort.    Heart:  +S1, +S2, Regular rate and rhythm, No edema. Abdomen:  Soft, nontender and nondistended. Normal bowel sounds, without guarding, and without rebound.   Neurologic:  Alert and  oriented x4;  grossly normal neurologically.  Impression/Plan: Pamela Avila is here for an colonoscopy to be performed for change in bowel habits Risks, benefits, limitations, and alternatives regarding  colonoscopy have been reviewed with the patient.  Questions have been answered.  All parties agreeable.   Wyline Mood, MD  03/20/2023, 9:03 AM

## 2023-03-20 NOTE — Transfer of Care (Signed)
Immediate Anesthesia Transfer of Care Note  Patient: NATIVIDAD HIEMSTRA  Procedure(s) Performed: COLONOSCOPY WITH PROPOFOL BIOPSY  Patient Location: PACU  Anesthesia Type:MAC  Level of Consciousness: awake  Airway & Oxygen Therapy: Patient Spontanous Breathing  Post-op Assessment: Report given to RN and Post -op Vital signs reviewed and stable  Post vital signs: Reviewed and stable  Last Vitals:  Vitals Value Taken Time  BP 101/65 03/20/23 1006  Temp    Pulse 102 03/20/23 1006  Resp 17 03/20/23 1006  SpO2 100 % 03/20/23 1006    Last Pain:  Vitals:   03/20/23 1006  TempSrc:   PainSc: 0-No pain         Complications: No notable events documented.

## 2023-03-21 ENCOUNTER — Encounter: Payer: Self-pay | Admitting: Gastroenterology

## 2023-03-22 ENCOUNTER — Ambulatory Visit: Payer: BC Managed Care – PPO | Admitting: Family Medicine

## 2023-03-25 ENCOUNTER — Encounter: Payer: Self-pay | Admitting: Gastroenterology

## 2023-04-02 ENCOUNTER — Encounter: Payer: Self-pay | Admitting: Gastroenterology

## 2023-04-02 ENCOUNTER — Ambulatory Visit (INDEPENDENT_AMBULATORY_CARE_PROVIDER_SITE_OTHER): Payer: BC Managed Care – PPO | Admitting: Gastroenterology

## 2023-04-02 ENCOUNTER — Other Ambulatory Visit: Payer: Self-pay | Admitting: Gastroenterology

## 2023-04-02 VITALS — BP 116/85 | HR 91 | Temp 98.6°F | Ht 61.0 in | Wt 168.9 lb

## 2023-04-02 DIAGNOSIS — K5900 Constipation, unspecified: Secondary | ICD-10-CM | POA: Diagnosis not present

## 2023-04-02 DIAGNOSIS — R159 Full incontinence of feces: Secondary | ICD-10-CM

## 2023-04-02 DIAGNOSIS — R197 Diarrhea, unspecified: Secondary | ICD-10-CM

## 2023-04-02 NOTE — Progress Notes (Signed)
Wyline Mood MD, MRCP(U.K) [address]  [address]  [place], [place] [address]  Main: [phone]  Fax: [phone]   Primary Care Physician: Dorcas Carrow, DO  Primary Gastroenterologist:  Dr. Wyline Mood   Chief Complaint  Patient presents with   IBS-D    HPI: Pamela Avila is a 52 y.o. female   Summary of history :   Last visit at our office in June 2024 for fecal incontinence.      In May 2018 she underwent an upper endoscopy that showed a 4 cm hiatal hernia LA grade a esophagiti. colonoscopy was also performed found to have nonbleeding internal hemorrhoids.  Random colon biopsies were negative for microscopic colitis which were negative.  Biopsies of the esophagus showed features of rare intraepithelial eosinophils possibly reflux esophagitis.  No evidence of celiac disease on duodenal biopsies.  She has had a recent history of fecal incontinence which occurs even in her sleep without her knowledge no abdominal discomfort. She has been on meloxicam which she takes daily for many years for knee pain.  Consumes soda and some daily.  Denies any artificial sugars or sweets.  Denies any history of prolonged labor during the birth of her children.  Denies any known episiotomy that was performed.  Any blood in the stool.  She has been taking magnesium supplements for her headache    Interval histor 01/14/2023-04/02/2023  01/22/2023: GI PCR, fecal calprotectin C. difficile were negative.  TSH very low 03/20/2023: Colonoscopy: Rectal exam was normal entire colon examination was normal random colon biopsies were negative for microscopic colitis at.  After cutting down on high fructose corn syrup magnesium supplements she is not having episodes of fecal incontinence rather has been having issues with constipation followed by diarrhea.  She has not tried any OTC meds for her constipation. Current Outpatient Medications  Medication Sig Dispense Refill    amphetamine-dextroamphetamine (ADDERALL XR) 20 MG 24 hr capsule Take 40 mg by mouth daily.     buPROPion (WELLBUTRIN XL) 150 MG 24 hr tablet TAKE 1 TABLET BY MOUTH EVERY DAY GENERIC EQUIVALENT FOR WELLBUTRIN XL 90 tablet 1   clonazePAM (KLONOPIN) 0.5 MG tablet Take 0.5 mg by mouth 2 (two) times daily as needed.     dicyclomine (BENTYL) 10 MG capsule Take 1 capsule (10 mg total) by mouth 3 (three) times daily as needed for spasms. 270 capsule 1   escitalopram (LEXAPRO) 20 MG tablet Take 20 mg by mouth daily.     levothyroxine (SYNTHROID) 88 MCG tablet Take 1 tablet (88 mcg total) by mouth daily. 30 tablet 1   meclizine (ANTIVERT) 25 MG tablet Take 1 tablet (25 mg total) by mouth 3 (three) times daily as needed for dizziness. 30 tablet 0   metoprolol succinate (TOPROL-XL) 50 MG 24 hr tablet Take 1 tablet (50 mg total) by mouth daily. GENERIC EQUIVALENT FOR TOPROL XL 90 tablet 1   naratriptan (AMERGE) 2.5 MG tablet Take 1 tab at onset of migraine. May repeat in 2 hours if needed. Max 2/day and 2-3 days/wk.     nortriptyline (PAMELOR) 10 MG capsule Take 50 mg by mouth at bedtime.     omeprazole (PRILOSEC) 20 MG capsule TAKE ONE CAPSULE BY MOUTH TWICE DAILY AS NEEDED FOR HEARTBURN/ INDIGESTION. (Patient taking differently: daily. TAKE ONE CAPSULE BY MOUTH TWICE DAILY AS NEEDED FOR HEARTBURN/ INDIGESTION.) 180 capsule 1   ondansetron (ZOFRAN-ODT) 4 MG disintegrating tablet Take 4 mg by mouth every 8 (eight)  hours as needed.     QULIPTA 60 MG TABS Take 1 tablet by mouth daily.     No current facility-administered medications for this visit.    Allergies as of 04/02/2023 - Review Complete 04/02/2023  Allergen Reaction Noted   Erythromycin Itching 02/01/2015   Bactrim [sulfamethoxazole-trimethoprim] Rash 03/20/2022        ROS:  General: Negative for anorexia, weight loss, fever, chills, fatigue, weakness. ENT: Negative for hoarseness, difficulty swallowing , nasal congestion. CV: Negative for  chest pain, angina, palpitations, dyspnea on exertion, peripheral edema.  Respiratory: Negative for dyspnea at rest, dyspnea on exertion, cough, sputum, wheezing.  GI: See history of present illness. GU:  Negative for dysuria, hematuria, urinary incontinence, urinary frequency, nocturnal urination.  Endo: Negative for unusual weight change.    Physical Examination:   BP 116/85   Pulse 91   Temp 98.6 F (37 C) (Oral)   Ht 5\' 1"  (1.549 m)   Wt 168 lb 14.4 oz (76.6 kg)   BMI 31.91 kg/m   General: Well-nourished, well-developed in no acute distress.  Eyes: No icterus. Conjunctivae pink. Neuro: Alert and oriented x 3.  Grossly intact. Skin: Warm and dry, no jaundice.   Psych: Alert and cooperative, normal mood and affect.   Imaging Studies: No results found.  Assessment and Plan:   Pamela Avila is a 52 y.o. y/o female here to follow-up for her fecal incontinence.  Colonoscopy with biopsies were negative for microscopic colitis.  Stool studies were negative for infection as well as inflammation.  Rectal exam on the day of colonoscopy appeared normal anal tone.  Very likely she has had some complaint osmotic diarrhea from foods high in artificial sugars and high fructose corn syrup which since she has stopped consuming has helped her.  Presently her issues more of constipation followed by diarrhea.  I suggested to commence on a high fiber diet with the aim of 25 to 30 g of fiber per day [facility] patient information provided if supplementation of fiber is required can use Metamucil 1 tablespoon twice a day.  I also suggested to use MiraLAX 1 capful once or twice a day to help her have regular bowel movements.    Dr Wyline Mood  MD,MRCP [facility]) Follow up in as needed

## 2023-04-09 ENCOUNTER — Ambulatory Visit (INDEPENDENT_AMBULATORY_CARE_PROVIDER_SITE_OTHER): Payer: BC Managed Care – PPO | Admitting: Family Medicine

## 2023-04-09 VITALS — BP 113/80 | HR 96 | Ht 61.0 in | Wt 171.0 lb

## 2023-04-09 DIAGNOSIS — E038 Other specified hypothyroidism: Secondary | ICD-10-CM

## 2023-04-09 DIAGNOSIS — I10 Essential (primary) hypertension: Secondary | ICD-10-CM | POA: Diagnosis not present

## 2023-04-09 MED ORDER — MECLIZINE HCL 25 MG PO TABS: 25.0000 mg | ORAL_TABLET | Freq: Three times a day (TID) | ORAL | 1 refills | Status: DC | PRN

## 2023-04-09 MED ORDER — ONDANSETRON 4 MG PO TBDP: 4.0000 mg | ORAL_TABLET | Freq: Three times a day (TID) | ORAL | 1 refills | Status: DC | PRN

## 2023-04-09 NOTE — Progress Notes (Signed)
BP 113/80   Pulse 96   Ht 5\' 1"  (1.549 m)   Wt 171 lb (77.6 kg)   SpO2 96%   BMI 32.31 kg/m    Subjective:    Patient ID: Pamela Avila, female    DOB: 1970/10/24, 52 y.o.   MRN: [id number]  HPI: Pamela Avila is a 52 y.o. female  Chief Complaint  Patient presents with   Hypertension   Fatigue    Patient says she has been extremely tired for the past couple of days. Patient says she is not sure if her iron may be low. Patient would like to discuss at today's visit.    HYPERTENSION  Hypertension status: controlled  Satisfied with current treatment? yes Duration of hypertension: chronic BP monitoring frequency:  not checking BP range:  BP medication side effects:  no Medication compliance: excellent compliance Previous BP meds:metoprolol Aspirin: no Recurrent headaches: yes Visual changes: no Palpitations: no Dyspnea: no Chest pain: no Lower extremity edema: no Dizzy/lightheaded: no  HYPOTHYROIDISM Thyroid control status:stable Satisfied with current treatment? yes Medication side effects: no Medication compliance: excellent compliance Recent dose adjustment:yes Fatigue: yes Cold intolerance: no Heat intolerance: no Weight gain: no Weight loss: no Constipation: no Diarrhea/loose stools: no Palpitations: no Lower extremity edema: no Anxiety/depressed mood: no   Relevant past medical, surgical, family and social history reviewed and updated as indicated. Interim medical history since our last visit reviewed. Allergies and medications reviewed and updated.  Review of Systems  Constitutional:  Positive for fatigue. Negative for activity change, appetite change, chills, diaphoresis, fever and unexpected weight change.  Respiratory: Negative.    Cardiovascular: Negative.   Gastrointestinal: Negative.   Musculoskeletal: Negative.   Neurological: Negative.   Psychiatric/Behavioral: Negative.      Per HPI unless specifically indicated above     Objective:     BP 113/80   Pulse 96   Ht 5\' 1"  (1.549 m)   Wt 171 lb (77.6 kg)   SpO2 96%   BMI 32.31 kg/m   Wt Readings from Last 3 Encounters:  04/09/23 171 lb (77.6 kg)  04/02/23 168 lb 14.4 oz (76.6 kg)  03/20/23 170 lb (77.1 kg)    Physical Exam Vitals and nursing note reviewed.  Constitutional:      General: She is not in acute distress.    Appearance: Normal appearance. She is obese. She is not ill-appearing, toxic-appearing or diaphoretic.  HENT:     Head: Normocephalic and atraumatic.     Right Ear: External ear normal.     Left Ear: External ear normal.     Nose: Nose normal.     Mouth/Throat:     Mouth: Mucous membranes are moist.     Pharynx: Oropharynx is clear.  Eyes:     General: No scleral icterus.       Right eye: No discharge.        Left eye: No discharge.     Extraocular Movements: Extraocular movements intact.     Conjunctiva/sclera: Conjunctivae normal.     Pupils: Pupils are equal, round, and reactive to light.  Cardiovascular:     Rate and Rhythm: Normal rate and regular rhythm.     Pulses: Normal pulses.     Heart sounds: Normal heart sounds. No murmur heard.    No friction rub. No gallop.  Pulmonary:     Effort: Pulmonary effort is normal. No respiratory distress.     Breath sounds: Normal breath sounds. No stridor. No  wheezing, rhonchi or rales.  Chest:     Chest wall: No tenderness.  Musculoskeletal:        General: Normal range of motion.     Cervical back: Normal range of motion and neck supple.  Skin:    General: Skin is warm and dry.     Capillary Refill: Capillary refill takes less than 2 seconds.     Coloration: Skin is not jaundiced or pale.     Findings: No bruising, erythema, lesion or rash.  Neurological:     General: No focal deficit present.     Mental Status: She is alert and oriented to person, place, and time. Mental status is at baseline.  Psychiatric:        Mood and Affect: Mood normal.        Behavior: Behavior normal.         Thought Content: Thought content normal.        Judgment: Judgment normal.     Results for orders placed or performed during the hospital encounter of 03/20/23  Glucose, capillary  Result Value Ref Range   Glucose-Capillary 111 (H) 70 - 99 mg/dL   Comment 1 IN EPIC   Pregnancy, urine POC  Result Value Ref Range   Preg Test, Ur NEGATIVE NEGATIVE      Assessment & Plan:   Problem List Items Addressed This Visit       Cardiovascular and Mediastinum   Benign essential hypertension - Primary    Under good control on current regimen. Continue current regimen. Continue to monitor. Call with any concerns. Refills given. Labs drawn today.       Relevant Orders   Basic metabolic panel   CBC with Differential/Platelet     Endocrine   Hypothyroidism    Rechecking labs today. Call with any concerns. Continue to monitor.       Relevant Orders   TSH     Follow up plan: Return End of November for 6 month follow up.

## 2023-04-09 NOTE — Assessment & Plan Note (Signed)
Rechecking labs today. Call with any concerns. Continue to monitor.

## 2023-04-09 NOTE — Assessment & Plan Note (Signed)
Under good control on current regimen. Continue current regimen. Continue to monitor. Call with any concerns. Refills given. Labs drawn today.   

## 2023-04-10 ENCOUNTER — Other Ambulatory Visit: Payer: Self-pay | Admitting: Family Medicine

## 2023-04-10 DIAGNOSIS — E038 Other specified hypothyroidism: Secondary | ICD-10-CM

## 2023-04-10 LAB — CBC WITH DIFFERENTIAL/PLATELET
Basophils Absolute: 0 10*3/uL (ref 0.0–0.2)
Basos: 1 %
EOS (ABSOLUTE): 0.2 10*3/uL (ref 0.0–0.4)
Eos: 3 %
Hematocrit: 42.6 % (ref 34.0–46.6)
Hemoglobin: 14.6 g/dL (ref 11.1–15.9)
Immature Grans (Abs): 0 10*3/uL (ref 0.0–0.1)
Immature Granulocytes: 0 %
Lymphocytes Absolute: 2.5 10*3/uL (ref 0.7–3.1)
Lymphs: 45 %
MCH: 31.8 pg (ref 26.6–33.0)
MCHC: 34.3 g/dL (ref 31.5–35.7)
MCV: 93 fL (ref 79–97)
Monocytes Absolute: 0.5 10*3/uL (ref 0.1–0.9)
Monocytes: 10 %
Neutrophils Absolute: 2.2 10*3/uL (ref 1.4–7.0)
Neutrophils: 41 %
Platelets: 268 10*3/uL (ref 150–450)
RBC: 4.59 x10E6/uL (ref 3.77–5.28)
RDW: 13.5 % (ref 11.7–15.4)
WBC: 5.4 10*3/uL (ref 3.4–10.8)

## 2023-04-10 LAB — BASIC METABOLIC PANEL
BUN/Creatinine Ratio: 19 (ref 9–23)
BUN: 15 mg/dL (ref 6–24)
CO2: 25 mmol/L (ref 20–29)
Calcium: 9.6 mg/dL (ref 8.7–10.2)
Chloride: 103 mmol/L (ref 96–106)
Creatinine, Ser: 0.79 mg/dL (ref 0.57–1.00)
Glucose: 150 mg/dL — ABNORMAL HIGH (ref 70–99)
Potassium: 4.3 mmol/L (ref 3.5–5.2)
Sodium: 142 mmol/L (ref 134–144)
eGFR: 91 mL/min/{1.73_m2} (ref >59)

## 2023-04-10 LAB — TSH: TSH: 0.051 u[IU]/mL — ABNORMAL LOW (ref 0.450–4.500)

## 2023-04-10 MED ORDER — LEVOTHYROXINE SODIUM 75 MCG PO TABS: 75.0000 ug | ORAL_TABLET | Freq: Every day | ORAL | 1 refills | Status: DC

## 2023-04-11 NOTE — Progress Notes (Signed)
Called patient and she declined getting her labs done in 6 weeks, she stated that she would not have any insurance.

## 2023-05-06 ENCOUNTER — Other Ambulatory Visit: Payer: Self-pay | Admitting: Family Medicine

## 2023-05-07 NOTE — Telephone Encounter (Signed)
Requested medication (s) are due for refill today: Yes  Requested medication (s) are on the active medication list: No  Last refill:  10/12/22  Future visit scheduled: Yes  Notes to clinic:  Unable to refill per protocol, cannot delegate. Medication discontinued 01/08/23     Requested Prescriptions  Pending Prescriptions Disp Refills   atomoxetine (STRATTERA) 40 MG capsule [Pharmacy Med Name: ATOMOXETINE HCL CAPS 40MG ] 180 capsule 3    Sig: TAKE 1 CAPSULE TWICE A DAY WITH MEALS     Not Delegated - Psychiatry: Norepinephrine Reuptake Inhibitor Failed - 05/06/2023  1:21 AM      Failed - This refill cannot be delegated      Passed - Completed PHQ-2 or PHQ-9 in the last 360 days      Passed - Last BP in normal range    BP Readings from Last 1 Encounters:  04/09/23 113/80         Passed - Last Heart Rate in normal range    Pulse Readings from Last 1 Encounters:  04/09/23 96         Passed - Valid encounter within last 6 months    Recent Outpatient Visits           4 weeks ago Benign essential hypertension   [facility] [place], Megan P, DO   2 months ago Benign essential hypertension   [place] [place], Megan P, DO   3 months ago Routine general medical examination at a health care facility   [facility] [place] P, DO   6 months ago Full incontinence of feces   [facility] [place], Megan P, DO   10 months ago Upper respiratory tract infection, unspecified type   [place] [facility] [place], Oswaldo Conroy, PA-C       Future Appointments             In 5 months Laural Benes, Oralia Rud, DO [place] [facility]

## 2023-06-05 DIAGNOSIS — S060X1A Concussion with loss of consciousness of 30 minutes or less, initial encounter: Secondary | ICD-10-CM | POA: Diagnosis not present

## 2023-06-17 ENCOUNTER — Ambulatory Visit: Payer: Self-pay | Admitting: *Deleted

## 2023-06-17 NOTE — Telephone Encounter (Signed)
  Chief Complaint: Rash, headache Symptoms: rash, localized left leg. Red raised, some areas half moon shaped. Itching, burning at times. Reports headaches,dry cough, congestion Frequency: Thursday Pertinent Negatives: Patient denies fever,SOB. BP during call 120/81 Disposition: [] ED /[] Urgent Care (no appt availability in office) / [x] Appointment(In office/virtual)/ []  [place] [facility]/ [] Home Care/ [] Refused Recommended Disposition /[]  Mobile Bus/ []  Follow-up with PCP Additional Notes: Appt secured for tomorrow. Care advise provided, pt verbalizes understanding.  Reason for Disposition  [1] Localized rash is very painful AND [2] no fever    Not painful,burning at times  Answer Assessment - Initial Assessment Questions 1. APPEARANCE of RASH: "Describe the rash."      Red raised, some "Half moon in spots." 2. LOCATION: "Where is the rash located?"      Right leg 3. NUMBER: "How many spots are there?"      Multiple 4. SIZE: "How big are the spots?" (Inches, centimeters or compare to size of a coin)      Vary 5. ONSET: "When did the rash start?"      Thursday 6. ITCHING: "Does the rash itch?" If Yes, ask: "How bad is the itch?"  (Scale 0-10; or none, mild, moderate, severe)     Itchy 7. PAIN: "Does the rash hurt?" If Yes, ask: "How bad is the pain?"  (Scale 0-10; or none, mild, moderate, severe)    - NONE (0): no pain    - MILD (1-3): doesn't interfere with normal activities     - MODERATE (4-7): interferes with normal activities or awakens from sleep     - SEVERE (8-10): excruciating pain, unable to do any normal activities     At times burning 8. OTHER SYMPTOMS: "Do you have any other symptoms?" (e.g., fever)     Congestion,dry cough  Protocols used: Rash or Redness - Localized-A-AH

## 2023-06-18 ENCOUNTER — Ambulatory Visit (INDEPENDENT_AMBULATORY_CARE_PROVIDER_SITE_OTHER): Payer: BC Managed Care – PPO | Admitting: Family Medicine

## 2023-06-18 VITALS — BP 128/93 | HR 94 | Temp 98.2°F | Ht 61.02 in | Wt 170.2 lb

## 2023-06-18 DIAGNOSIS — E038 Other specified hypothyroidism: Secondary | ICD-10-CM

## 2023-06-18 DIAGNOSIS — E039 Hypothyroidism, unspecified: Secondary | ICD-10-CM | POA: Diagnosis not present

## 2023-06-18 DIAGNOSIS — B029 Zoster without complications: Secondary | ICD-10-CM | POA: Diagnosis not present

## 2023-06-18 DIAGNOSIS — J069 Acute upper respiratory infection, unspecified: Secondary | ICD-10-CM | POA: Diagnosis not present

## 2023-06-18 MED ORDER — TRIAMCINOLONE ACETONIDE 40 MG/ML IJ SUSP
40.0000 mg | Freq: Once | INTRAMUSCULAR | Status: AC
Start: 2023-06-18 — End: 2023-06-18
  Administered 2023-06-18: 40 mg via INTRAMUSCULAR

## 2023-06-18 MED ORDER — PREDNISONE 10 MG PO TABS
ORAL_TABLET | ORAL | 0 refills | Status: DC
Start: 2023-06-18 — End: 2023-10-11

## 2023-06-18 MED ORDER — VALACYCLOVIR HCL 1 G PO TABS: 1000.0000 mg | ORAL_TABLET | Freq: Three times a day (TID) | ORAL | 0 refills | Status: AC

## 2023-06-18 NOTE — Progress Notes (Unsigned)
BP (!) 128/93   Pulse 94   Temp 98.2 F (36.8 C) (Oral)   Ht 5' 1.02" (1.55 m)   Wt 170 lb 3.2 oz (77.2 kg)   SpO2 98%   BMI 32.13 kg/m    Subjective:    Patient ID: Pamela Avila, female    DOB: 28-Dec-1970, 52 y.o.   MRN: [id number]  HPI: Pamela Avila is a 52 y.o. female  No chief complaint on file.   RASH She has not shingles vaccine.  Duration: 5 days  Location:  Right upper thigh, posteriorly and anteriorly    Itching: yes Burning: yes Redness: yes Oozing: no Scaling: no Blisters: Yes  Painful: no Fevers: no Change in detergents/soaps/personal care products: no Recent illness: yes congestion for 2 days; has not tried anything. Admits to productive cough and headache. Denies wheezing, sob, cp, sore throat, sinus pressure, runny nose. Denies sick exposures. Recent travel:yes History of same: no Context: stable Alleviating factors:  Antifungal cream  Treatments attempted:OTC anit-fungal Shortness of breath: no  Throat/tongue swelling: no Myalgias/arthralgias: no   Relevant past medical, surgical, family and social history reviewed and updated as indicated. Interim medical history since our last visit reviewed. Allergies and medications reviewed and updated.  Review of Systems  Constitutional:  Negative for chills, fatigue and fever.  HENT:  Positive for congestion and rhinorrhea. Negative for ear pain, postnasal drip, sinus pressure, sinus pain, sneezing and sore throat.   Eyes:  Negative for discharge, redness and itching.  Respiratory:  Positive for cough. Negative for chest tightness, shortness of breath and wheezing.   Cardiovascular:  Negative for chest pain.  Gastrointestinal:  Negative for vomiting.  Skin:  Negative for rash.  Neurological:  Positive for headaches.    Per HPI unless specifically indicated above     Objective:    BP (!) 128/93   Pulse 94   Temp 98.2 F (36.8 C) (Oral)   Ht 5' 1.02" (1.55 m)   Wt 170 lb 3.2 oz (77.2 kg)    SpO2 98%   BMI 32.13 kg/m   Wt Readings from Last 3 Encounters:  06/18/23 170 lb 3.2 oz (77.2 kg)  04/09/23 171 lb (77.6 kg)  04/02/23 168 lb 14.4 oz (76.6 kg)    Physical Exam Vitals and nursing note reviewed.  Constitutional:      General: She is awake. She is not in acute distress.    Appearance: Normal appearance. She is well-developed and well-groomed. She is not ill-appearing.  HENT:     Head: Normocephalic and atraumatic.     Right Ear: Hearing and external ear normal. No drainage. Tympanic membrane is not erythematous.     Left Ear: Hearing and external ear normal. No drainage. Tympanic membrane is not erythematous.     Nose: Congestion present. No rhinorrhea.     Right Turbinates: Not pale.     Left Turbinates: Not pale.     Right Sinus: No maxillary sinus tenderness or frontal sinus tenderness.     Left Sinus: No maxillary sinus tenderness or frontal sinus tenderness.     Mouth/Throat:     Pharynx: No posterior oropharyngeal erythema.  Eyes:     General: Lids are normal.        Right eye: No discharge.        Left eye: No discharge.     Conjunctiva/sclera: Conjunctivae normal.  Cardiovascular:     Rate and Rhythm: Normal rate and regular rhythm.  Pulses:          Radial pulses are 2+ on the right side and 2+ on the left side.       Posterior tibial pulses are 2+ on the right side and 2+ on the left side.     Heart sounds: Normal heart sounds, S1 normal and S2 normal. No murmur heard.    No gallop.  Pulmonary:     Effort: Pulmonary effort is normal. No accessory muscle usage or respiratory distress.     Breath sounds: Normal breath sounds.  Musculoskeletal:        General: Normal range of motion.     Cervical back: Full passive range of motion without pain and normal range of motion.     Right lower leg: No edema.     Left lower leg: No edema.  Skin:    General: Skin is warm and dry.     Capillary Refill: Capillary refill takes less than 2 seconds.      Findings: Rash present. Rash is vesicular.       Neurological:     Mental Status: She is alert and oriented to person, place, and time.  Psychiatric:        Attention and Perception: Attention normal.        Mood and Affect: Mood normal.        Speech: Speech normal.        Behavior: Behavior normal. Behavior is cooperative.        Thought Content: Thought content normal.     Results for orders placed or performed in visit on 04/09/23  Basic metabolic panel  Result Value Ref Range   Glucose 150 (H) 70 - 99 mg/dL   BUN 15 6 - 24 mg/dL   Creatinine, Ser 9.62 0.57 - 1.00 mg/dL   eGFR 91 >95 MW/UXL/2.44   BUN/Creatinine Ratio 19 9 - 23   Sodium 142 134 - 144 mmol/L   Potassium 4.3 3.5 - 5.2 mmol/L   Chloride 103 96 - 106 mmol/L   CO2 25 20 - 29 mmol/L   Calcium 9.6 8.7 - 10.2 mg/dL  CBC with Differential/Platelet  Result Value Ref Range   WBC 5.4 3.4 - 10.8 x10E3/uL   RBC 4.59 3.77 - 5.28 x10E6/uL   Hemoglobin 14.6 11.1 - 15.9 g/dL   Hematocrit 01.0 27.2 - 46.6 %   MCV 93 79 - 97 fL   MCH 31.8 26.6 - 33.0 pg   MCHC 34.3 31.5 - 35.7 g/dL   RDW 53.6 64.4 - 03.4 %   Platelets 268 150 - 450 x10E3/uL   Neutrophils 41 Not Estab. %   Lymphs 45 Not Estab. %   Monocytes 10 Not Estab. %   Eos 3 Not Estab. %   Basos 1 Not Estab. %   Neutrophils Absolute 2.2 1.4 - 7.0 x10E3/uL   Lymphocytes Absolute 2.5 0.7 - 3.1 x10E3/uL   Monocytes Absolute 0.5 0.1 - 0.9 x10E3/uL   EOS (ABSOLUTE) 0.2 0.0 - 0.4 x10E3/uL   Basophils Absolute 0.0 0.0 - 0.2 x10E3/uL   Immature Granulocytes 0 Not Estab. %   Immature Grans (Abs) 0.0 0.0 - 0.1 x10E3/uL  TSH  Result Value Ref Range   TSH 0.051 (L) 0.450 - 4.500 uIU/mL      Assessment & Plan:   Problem List Items Addressed This Visit     Hypothyroidism   Other Visit Diagnoses     Acute URI    -  Primary  Zyrtec samples given   Relevant Medications   valACYclovir (VALTREX) 1000 MG tablet   Other Relevant Orders   Veritor Flu A/B Waived    Novel Coronavirus, NAA ([facility])   Herpes zoster of thigh       Kenalog, Prednisone, Valtrex   Relevant Medications   triamcinolone acetonide (KENALOG-40) injection 40 mg (Completed)   valACYclovir (VALTREX) 1000 MG tablet        Follow up plan: Return in about 1 week (around 06/25/2023) for Follow up shingles.

## 2023-06-18 NOTE — Patient Instructions (Signed)
Take Zyrtec daily for congestion Warm liquids such as hot tea/soup

## 2023-06-19 LAB — NOVEL CORONAVIRUS, NAA: SARS-CoV-2, NAA: NOT DETECTED

## 2023-06-19 LAB — VERITOR FLU A/B WAIVED
Influenza A: NEGATIVE
Influenza B: NEGATIVE

## 2023-06-19 LAB — TSH: TSH: 35.6 u[IU]/mL — ABNORMAL HIGH (ref 0.450–4.500)

## 2023-06-20 MED ORDER — LEVOTHYROXINE SODIUM 100 MCG PO TABS: 100.0000 ug | ORAL_TABLET | Freq: Every day | ORAL | 1 refills | Status: DC

## 2023-06-20 NOTE — Progress Notes (Signed)
Hello, can you inform patient that I have increased her Levothyroxine dose to 100 micrograms daily, and sent this over to her pharmacy. Please advise her to take medications daily, and schedule a return lab visit for her in 6 weeks for a thyroid recheck. Thank you.

## 2023-06-20 NOTE — Progress Notes (Signed)
Hi Pamela Avila, COVID has returned negative. Thank you for allowing me to participate in your care.

## 2023-06-20 NOTE — Addendum Note (Signed)
Addended by: Prescott Gum on: 06/20/2023 10:28 AM   Modules accepted: Orders

## 2023-06-27 ENCOUNTER — Telehealth: Payer: BC Managed Care – PPO | Admitting: Family Medicine

## 2023-06-27 DIAGNOSIS — B029 Zoster without complications: Secondary | ICD-10-CM

## 2023-06-27 NOTE — Progress Notes (Signed)
This visit was completed via video due to the restrictions of the COVID-19 pandemic. All issues as above were discussed and addressed but no physical exam was performed. If it was felt that the patient should be evaluated in the office, they were directed there. The patient verbally consented to this visit. Due to the catastrophic nature of the COVID-19 pandemic, this visit was done through video contact only.","This visit was completed via video visit through MyChart due to the restrictions of the COVID-19 pandemic. All issues as above were discussed and addressed. Physical exam was done as above through visual confirmation on video through MyChart. If it was felt that the patient should be evaluated in the office, they were directed there. The patient verbally consented to this visit."} Location of the patient: home Location of the provider: work Those involved with this call:  Provider:  Prescott Gum, NP Front Desk/Registration:  Tarry Kos   Time spent on call:  10 minutes with patient face to face via video conference. More than 50% of this time was spent in counseling and coordination of care. 5 minutes total spent in review of patient's record and preparation of their chart.   There were no vitals taken for this visit.   Subjective:    Patient ID: Pamela Avila, female    DOB: 29-Jul-1971, 52 y.o.   MRN: [id number]  HPI: SUCCESS DELLES is a 52 y.o. female  Chief Complaint  Patient presents with   Follow-up    Shingles rash   RASH She states there are a few spots of the herpes zoster rash that she admits to being above right knee anteriorly and posteriorly on the left knee. She feels it has improved. She has completed the steroids and has taken 6/7 dose of valtrex. She admits that the redness has improved and blisters have crusted over. She denies itchiness, burning, pain, and numbness.   Shortness of breath: no  Myalgias/arthralgias: no   Relevant past medical, surgical, family  and social history reviewed and updated as indicated. Interim medical history since our last visit reviewed. Allergies and medications reviewed and updated.  Review of Systems  Constitutional:  Negative for fever.  Respiratory: Negative.    Cardiovascular: Negative.   Skin:  Positive for rash (Improved, crusted over).    Per HPI unless specifically indicated above     Objective:    There were no vitals taken for this visit.  Wt Readings from Last 3 Encounters:  06/18/23 170 lb 3.2 oz (77.2 kg)  04/09/23 171 lb (77.6 kg)  04/02/23 168 lb 14.4 oz (76.6 kg)      Results for orders placed or performed in visit on 06/18/23  Novel Coronavirus, NAA ([facility])   Specimen: Nasopharyngeal(NP) swabs in vial transport medium  Result Value Ref Range   SARS-CoV-2, NAA Not Detected Not Detected  TSH  Result Value Ref Range   TSH 35.600 (H) 0.450 - 4.500 uIU/mL  Veritor Flu A/B Waived  Result Value Ref Range   Influenza A Negative Negative   Influenza B Negative Negative      Assessment & Plan:   Problem List Items Addressed This Visit   None Visit Diagnoses     Herpes zoster of thigh    -  Primary   Acute, improved. Completed 6 day Prednsionse taper and 6/7 day course of Valtrex, reports significant improvement with rash crusting over. Return as needed.         Follow up plan: Return in about  5 weeks (around 08/01/2023) for Follow up TSH labs.

## 2023-06-28 ENCOUNTER — Telehealth: Payer: Self-pay | Admitting: Family Medicine

## 2023-06-28 NOTE — Telephone Encounter (Signed)
Call patient to have her schedule an appt for 5 weeks to have thyroid levels checked put in CRM for [facility]

## 2023-06-28 NOTE — Telephone Encounter (Signed)
Called patient to schedule appt. Left message on machine  for patient to call and schedule an appt.

## 2023-06-28 NOTE — Telephone Encounter (Signed)
-----   Message from Weber Cooks sent at 06/27/2023  8:30 PM EST ----- Hello, can we schedule her for a lab visit in 5 weeks to check thyroid levels? Thank you.

## 2023-09-16 ENCOUNTER — Other Ambulatory Visit: Payer: Self-pay | Admitting: Family Medicine

## 2023-09-17 NOTE — Telephone Encounter (Signed)
 Change of pharmacy Requested Prescriptions  Pending Prescriptions Disp Refills   omeprazole  (PRILOSEC) 20 MG capsule [Pharmacy Med Name: OMEPRAZOLE  DR CAPS 20MG ] 180 capsule 0    Sig: TAKE 1 CAPSULE TWICE A DAY AS NEEDED FOR HEARTBURN/INDIGESTION     Gastroenterology: Proton Pump Inhibitors Passed - 09/17/2023  1:27 PM      Passed - Valid encounter within last 12 months    Recent Outpatient Visits           2 months ago Herpes zoster of thigh   [place] [facility] [place], Hyla Givens, NP   3 months ago Herpes zoster of thigh   [facility] [place], Hyla Givens, NP   5 months ago Benign essential hypertension   [place] [facility] [place], Megan P, DO   6 months ago Benign essential hypertension   [facility] [place], Megan P, DO   8 months ago Routine general medical examination at a health care facility   [facility] [place], [place] P, DO       Future Appointments             In 3 weeks Vicci, Duwaine SQUIBB, DO [place] [facility], [facility]             metoprolol  succinate (TOPROL -XL) 25 MG 24 hr tablet [Pharmacy Med Name: METOPROLOL  SUCCINATE ER TABS 25MG ] 135 tablet 0    Sig: TAKE ONE AND ONE-HALF TABLETS (37.5 MG TOTAL) DAILY     Cardiovascular:  Beta Blockers Failed - 09/17/2023  1:27 PM      Failed - Last BP in normal range    BP Readings from Last 1 Encounters:  06/18/23 (!) 128/93         Passed - Last Heart Rate in normal range    Pulse Readings from Last 1 Encounters:  06/18/23 94         Passed - Valid encounter within last 6 months    Recent Outpatient Visits           2 months ago Herpes zoster of thigh   [facility] [place], Hyla Givens, NP   3 months ago Herpes zoster of thigh   [place] [facility] [place], Hyla Givens, NP   5 months ago Benign essential hypertension   [facility] [place], [place], DO   6 months ago Benign essential hypertension   [place] [facility] [place], Megan P, DO   8 months ago Routine general medical examination at a health care facility   [facility] [place], [place] P, DO       Future Appointments             In 3 weeks Vicci, Duwaine SQUIBB, DO [place] [facility], [facility]            Refused Prescriptions Disp Refills   lisinopril  (ZESTRIL ) 10 MG tablet [Pharmacy Med Name: LISINOPRIL  TABS 10MG ] 90 tablet 3    Sig: TAKE 1 TABLET DAILY     Cardiovascular:  ACE Inhibitors Failed - 09/17/2023  1:27 PM      Failed - Last BP in normal range    BP Readings from Last 1 Encounters:  06/18/23 (!) 128/93         Passed - Cr in normal range and within 180 days    Creatinine  Date Value Ref  Range Status  12/29/2020 173.7 20.0 - 300.0 mg/dL Final   Creatinine, Ser  Date Value Ref Range Status  04/09/2023 0.79 0.57 - 1.00 mg/dL Final         Passed - K in normal range and within 180 days    Potassium  Date Value Ref Range Status  04/09/2023 4.3 3.5 - 5.2 mmol/L Final         Passed - Patient is not pregnant      Passed - Valid encounter within last 6 months    Recent Outpatient Visits           2 months ago Herpes zoster of thigh   [facility] [place], Hyla Givens, NP   3 months ago Herpes zoster of thigh   [place] [facility] [place], Hyla Givens, NP   5 months ago Benign essential hypertension   [place] [facility] [place], [place], DO   6 months ago Benign essential hypertension   [facility] [place], Megan P, DO   8 months ago Routine general medical examination at a health care facility   [facility] Vicci Duwaine SQUIBB, DO       Future Appointments             In 3 weeks Vicci Duwaine SQUIBB, DO [facility], [facility]

## 2023-09-30 DIAGNOSIS — F5102 Adjustment insomnia: Secondary | ICD-10-CM | POA: Diagnosis not present

## 2023-09-30 DIAGNOSIS — G3184 Mild cognitive impairment, so stated: Secondary | ICD-10-CM | POA: Diagnosis not present

## 2023-09-30 DIAGNOSIS — G43101 Migraine with aura, not intractable, with status migrainosus: Secondary | ICD-10-CM | POA: Diagnosis not present

## 2023-09-30 DIAGNOSIS — F419 Anxiety disorder, unspecified: Secondary | ICD-10-CM | POA: Diagnosis not present

## 2023-10-11 ENCOUNTER — Encounter: Payer: Self-pay | Admitting: Family Medicine

## 2023-10-11 ENCOUNTER — Ambulatory Visit: Payer: BC Managed Care – PPO | Admitting: Family Medicine

## 2023-10-11 VITALS — BP 123/85 | HR 89 | Temp 97.9°F | Ht 61.0 in | Wt 182.6 lb

## 2023-10-11 DIAGNOSIS — E038 Other specified hypothyroidism: Secondary | ICD-10-CM | POA: Diagnosis not present

## 2023-10-11 DIAGNOSIS — I10 Essential (primary) hypertension: Secondary | ICD-10-CM | POA: Diagnosis not present

## 2023-10-11 DIAGNOSIS — J0100 Acute maxillary sinusitis, unspecified: Secondary | ICD-10-CM | POA: Diagnosis not present

## 2023-10-11 MED ORDER — METOPROLOL SUCCINATE ER 50 MG PO TB24: 50.0000 mg | ORAL_TABLET | Freq: Every day | ORAL | 1 refills | Status: DC

## 2023-10-11 MED ORDER — OMEPRAZOLE 20 MG PO CPDR: DELAYED_RELEASE_CAPSULE | ORAL | 1 refills | Status: DC

## 2023-10-11 MED ORDER — MECLIZINE HCL 25 MG PO TABS: 25.0000 mg | ORAL_TABLET | Freq: Three times a day (TID) | ORAL | 1 refills | Status: AC | PRN

## 2023-10-11 MED ORDER — LEVOTHYROXINE SODIUM 100 MCG PO TABS: 112.0000 ug | ORAL_TABLET | Freq: Every day | ORAL | Status: DC

## 2023-10-11 MED ORDER — ONDANSETRON 4 MG PO TBDP: 4.0000 mg | ORAL_TABLET | Freq: Three times a day (TID) | ORAL | 1 refills | Status: DC | PRN

## 2023-10-11 MED ORDER — AMOXICILLIN 500 MG PO CAPS: 500.0000 mg | ORAL_CAPSULE | Freq: Two times a day (BID) | ORAL | 0 refills | Status: AC

## 2023-10-11 NOTE — Progress Notes (Signed)
 BP 123/85 (BP Location: Left Arm, Patient Position: Sitting, Cuff Size: Normal)   Pulse 89   Temp 97.9 F (36.6 C) (Oral)   Ht 5\' 1"  (1.549 m)   Wt 182 lb 9.6 oz (82.8 kg)   SpO2 98%   BMI 34.50 kg/m    Subjective:    Patient ID: Pamela Avila, female    DOB: 1970-11-23, 53 y.o.   MRN: [id number]  HPI: Pamela Avila is a 53 y.o. female  Chief Complaint  Patient presents with   Hypertension    No concerns   Sinusitis    Patient think she might have an Sinus infection started a few days ago and she haven't took anything for it    HYPOTHYROIDISM Thyroid control status:stable Satisfied with current treatment? yes Medication side effects: no Medication compliance: excellent compliance Etiology of hypothyroidism:  Recent dose adjustment:no Fatigue: no Cold intolerance: no Heat intolerance: no Weight gain: no Weight loss: no Constipation: no Diarrhea/loose stools: no Palpitations: no Lower extremity edema: no Anxiety/depressed mood: no  HYPERTENSION   Hypertension status: controlled  Satisfied with current treatment? yes Duration of hypertension: chronic BP monitoring frequency:  not checking BP medication side effects:  no Medication compliance: excellent compliance Previous BP meds:metoprolol Aspirin: no Recurrent headaches: no Visual changes: no Palpitations: no Dyspnea: no Chest pain: no Lower extremity edema: no Dizzy/lightheaded: no  UPPER RESPIRATORY TRACT INFECTION Duration: a few days Worst symptom: headaches Fever: no Cough: yes Shortness of breath: no Wheezing: no Chest pain: no Chest tightness: no Chest congestion: no Nasal congestion: yes Runny nose: yes Post nasal drip: yes Sneezing: no Sore throat: no Swollen glands: no Sinus pressure: no Headache: yes Face pain: no Toothache: no Ear pain: no  Ear pressure: yes bilateral Eyes red/itching:no Eye drainage/crusting: no  Vomiting: no Rash: no Fatigue: yes Sick contacts:  no Strep contacts: no  Context: stable Recurrent sinusitis: no Relief with OTC cold/cough medications: no  Treatments attempted: none   Relevant past medical, surgical, family and social history reviewed and updated as indicated. Interim medical history since our last visit reviewed. Allergies and medications reviewed and updated.  Review of Systems  Constitutional: Negative.   Respiratory: Negative.    Cardiovascular: Negative.   Gastrointestinal: Negative.   Musculoskeletal: Negative.   Neurological: Negative.   Psychiatric/Behavioral: Negative.      Per HPI unless specifically indicated above     Objective:    BP 123/85 (BP Location: Left Arm, Patient Position: Sitting, Cuff Size: Normal)   Pulse 89   Temp 97.9 F (36.6 C) (Oral)   Ht 5\' 1"  (1.549 m)   Wt 182 lb 9.6 oz (82.8 kg)   SpO2 98%   BMI 34.50 kg/m   Wt Readings from Last 3 Encounters:  10/11/23 182 lb 9.6 oz (82.8 kg)  06/18/23 170 lb 3.2 oz (77.2 kg)  04/09/23 171 lb (77.6 kg)    Physical Exam Vitals and nursing note reviewed.  Constitutional:      General: She is not in acute distress.    Appearance: Normal appearance. She is not ill-appearing, toxic-appearing or diaphoretic.  HENT:     Head: Normocephalic and atraumatic.     Right Ear: External ear normal.     Left Ear: External ear normal.     Nose: Nose normal.     Mouth/Throat:     Mouth: Mucous membranes are moist.     Pharynx: Oropharynx is clear.  Eyes:     General: No  scleral icterus.       Right eye: No discharge.        Left eye: No discharge.     Extraocular Movements: Extraocular movements intact.     Conjunctiva/sclera: Conjunctivae normal.     Pupils: Pupils are equal, round, and reactive to light.  Cardiovascular:     Rate and Rhythm: Normal rate and regular rhythm.     Pulses: Normal pulses.     Heart sounds: Normal heart sounds. No murmur heard.    No friction rub. No gallop.  Pulmonary:     Effort: Pulmonary effort is  normal. No respiratory distress.     Breath sounds: Normal breath sounds. No stridor. No wheezing, rhonchi or rales.  Chest:     Chest wall: No tenderness.  Musculoskeletal:        General: Normal range of motion.     Cervical back: Normal range of motion and neck supple.  Skin:    General: Skin is warm and dry.     Capillary Refill: Capillary refill takes less than 2 seconds.     Coloration: Skin is not jaundiced or pale.     Findings: No bruising, erythema, lesion or rash.  Neurological:     General: No focal deficit present.     Mental Status: She is alert and oriented to person, place, and time. Mental status is at baseline.  Psychiatric:        Mood and Affect: Mood normal.        Behavior: Behavior normal.        Thought Content: Thought content normal.        Judgment: Judgment normal.     Results for orders placed or performed in visit on 06/18/23  TSH   Collection Time: 06/18/23  4:27 PM  Result Value Ref Range   TSH 35.600 (H) 0.450 - 4.500 uIU/mL  Veritor Flu A/B Waived   Collection Time: 06/18/23  4:33 PM  Result Value Ref Range   Influenza A Negative Negative   Influenza B Negative Negative  Novel Coronavirus, NAA ([facility])   Collection Time: 06/18/23  4:35 PM   Specimen: Nasopharyngeal(NP) swabs in vial transport medium  Result Value Ref Range   SARS-CoV-2, NAA Not Detected Not Detected      Assessment & Plan:   Problem List Items Addressed This Visit       Cardiovascular and Mediastinum   Benign essential hypertension   Under good control on current regimen. Continue current regimen. Continue to monitor. Call with any concerns. Refills given. Labs to be drawn ASAP.        Relevant Medications   metoprolol succinate (TOPROL-XL) 50 MG 24 hr tablet   Other Relevant Orders   Basic metabolic panel     Endocrine   Hypothyroidism - Primary   Will recheck labs and treat as needed. Call with any concerns.       Relevant Medications   metoprolol  succinate (TOPROL-XL) 50 MG 24 hr tablet   Other Relevant Orders   TSH   Other Visit Diagnoses       Acute non-recurrent maxillary sinusitis       Will treat with amoxicillin. Call if not getting better or getting worse.   Relevant Medications   amoxicillin (AMOXIL) 500 MG capsule        Follow up plan: Return in about 3 months (around 01/08/2024) for physical.

## 2023-10-14 NOTE — Assessment & Plan Note (Signed)
Will recheck labs and treat as needed. Call with any concerns.  

## 2023-10-14 NOTE — Assessment & Plan Note (Signed)
Under good control on current regimen. Continue current regimen. Continue to monitor. Call with any concerns. Refills given. Labs to be drawn ASAP.   

## 2023-11-12 DIAGNOSIS — S060X1A Concussion with loss of consciousness of 30 minutes or less, initial encounter: Secondary | ICD-10-CM | POA: Diagnosis not present

## 2023-11-14 ENCOUNTER — Telehealth: Payer: Self-pay | Admitting: Family Medicine

## 2023-11-14 DIAGNOSIS — Z7689 Persons encountering health services in other specified circumstances: Secondary | ICD-10-CM

## 2023-11-14 NOTE — Telephone Encounter (Signed)
Referral placed on patient's behalf.

## 2023-11-14 NOTE — Telephone Encounter (Signed)
 Copied from CRM [id number]. Topic: General - Call Back - No Documentation >> Nov 12, 2023  4:55 PM Assunta Found S wrote: Reason for CRM: Patient is requesting a callback from a social worker to assist with filling out paperwork for long term disability.  Callback #: [phone]

## 2023-11-15 ENCOUNTER — Telehealth: Payer: Self-pay | Admitting: *Deleted

## 2023-11-15 NOTE — Progress Notes (Signed)
 Complex Care Management Note Care Guide Note  11/15/2023 Name: Pamela Avila MRN: [id number] DOB: June 25, 1971   Complex Care Management Outreach Attempts: An unsuccessful telephone outreach was attempted today to offer the patient information about available complex care management services.  Follow Up Plan:  Additional outreach attempts will be made to offer the patient complex care management information and services.   Encounter Outcome:  No Answer  Burman Nieves, CMA [place]  [facility], [facility] Guide Direct Dial: [phone]  Fax: [phone] Website: [email or web address]

## 2023-11-20 ENCOUNTER — Encounter: Payer: Self-pay | Admitting: Family Medicine

## 2023-11-21 NOTE — Progress Notes (Signed)
 Complex Care Management Note Care Guide Note  11/21/2023 Name: Pamela Avila MRN: [id number] DOB: [birth year]   Complex Care Management Outreach Attempts: A second unsuccessful outreach was attempted today to offer the patient with information about available complex care management services.  Follow Up Plan:  Additional outreach attempts will be made to offer the patient complex care management information and services.   Encounter Outcome:  No Answer  Penne Lash , RMA     [place]  [facility], [facility] Guide  Direct Dial: [phone]  Website: [email or web address]

## 2023-12-02 NOTE — Progress Notes (Signed)
 Complex Care Management Note Care Guide Note  12/02/2023 Name: Pamela Avila MRN: [id number] DOB: [birth year]   Complex Care Management Outreach Attempts: A third unsuccessful outreach was attempted today to offer the patient with information about available complex care management services.  Follow Up Plan:  No further outreach attempts will be made at this time. We have been unable to contact the patient to offer or enroll patient in complex care management services.  Encounter Outcome:  No Answer  Lenton Rail , RMA     [place]  [facility], [facility] Guide  Direct Dial: [phone]  Website: [email or web address]

## 2024-01-21 ENCOUNTER — Other Ambulatory Visit (INDEPENDENT_AMBULATORY_CARE_PROVIDER_SITE_OTHER)

## 2024-01-21 DIAGNOSIS — E038 Other specified hypothyroidism: Secondary | ICD-10-CM | POA: Diagnosis not present

## 2024-01-21 DIAGNOSIS — I10 Essential (primary) hypertension: Secondary | ICD-10-CM | POA: Diagnosis not present

## 2024-01-22 ENCOUNTER — Encounter: Payer: Self-pay | Admitting: Family Medicine

## 2024-01-22 LAB — BASIC METABOLIC PANEL WITH GFR
BUN/Creatinine Ratio: 17 (ref 9–23)
BUN: 19 mg/dL (ref 6–24)
CO2: 22 mmol/L (ref 20–29)
Calcium: 9.8 mg/dL (ref 8.7–10.2)
Chloride: 99 mmol/L (ref 96–106)
Creatinine, Ser: 1.09 mg/dL — ABNORMAL HIGH (ref 0.57–1.00)
Glucose: 131 mg/dL — ABNORMAL HIGH (ref 70–99)
Potassium: 4.4 mmol/L (ref 3.5–5.2)
Sodium: 137 mmol/L (ref 134–144)
eGFR: 61 mL/min/{1.73_m2} (ref >59)

## 2024-01-22 LAB — TSH: TSH: 234 u[IU]/mL — ABNORMAL HIGH (ref 0.450–4.500)

## 2024-01-26 ENCOUNTER — Ambulatory Visit: Payer: Self-pay | Admitting: Family Medicine

## 2024-01-26 MED ORDER — LEVOTHYROXINE SODIUM 112 MCG PO TABS: 112.0000 ug | ORAL_TABLET | Freq: Every day | ORAL | 1 refills | Status: DC

## 2024-02-10 DIAGNOSIS — F419 Anxiety disorder, unspecified: Secondary | ICD-10-CM | POA: Diagnosis not present

## 2024-03-02 ENCOUNTER — Ambulatory Visit (INDEPENDENT_AMBULATORY_CARE_PROVIDER_SITE_OTHER): Admitting: Family Medicine

## 2024-03-02 ENCOUNTER — Encounter: Payer: Self-pay | Admitting: Family Medicine

## 2024-03-02 VITALS — BP 113/80 | HR 92 | Temp 98.0°F | Ht 61.0 in | Wt 176.5 lb

## 2024-03-02 DIAGNOSIS — Z833 Family history of diabetes mellitus: Secondary | ICD-10-CM

## 2024-03-02 DIAGNOSIS — E038 Other specified hypothyroidism: Secondary | ICD-10-CM

## 2024-03-02 DIAGNOSIS — R2689 Other abnormalities of gait and mobility: Secondary | ICD-10-CM

## 2024-03-02 DIAGNOSIS — R300 Dysuria: Secondary | ICD-10-CM

## 2024-03-02 DIAGNOSIS — I10 Essential (primary) hypertension: Secondary | ICD-10-CM | POA: Diagnosis not present

## 2024-03-02 DIAGNOSIS — Z720 Tobacco use: Secondary | ICD-10-CM

## 2024-03-02 DIAGNOSIS — F0781 Postconcussional syndrome: Secondary | ICD-10-CM

## 2024-03-02 DIAGNOSIS — Z0000 Encounter for general adult medical examination without abnormal findings: Secondary | ICD-10-CM

## 2024-03-02 LAB — MICROALBUMIN, URINE WAIVED
Creatinine, Urine Waived: 300 mg/dL (ref 10–300)
Microalb, Ur Waived: 80 mg/L — ABNORMAL HIGH (ref 0–19)

## 2024-03-02 LAB — BAYER DCA HB A1C WAIVED: HB A1C (BAYER DCA - WAIVED): 5.6 % (ref 4.8–5.6)

## 2024-03-02 MED ORDER — DICYCLOMINE HCL 10 MG PO CAPS: 10.0000 mg | ORAL_CAPSULE | Freq: Three times a day (TID) | ORAL | 1 refills | Status: AC | PRN

## 2024-03-02 MED ORDER — METOPROLOL SUCCINATE ER 50 MG PO TB24
50.0000 mg | ORAL_TABLET | Freq: Every day | ORAL | 1 refills | Status: AC
  Filled 2024-07-06: qty 30, 30d supply, fill #0
  Filled 2024-08-26 – 2024-08-31 (×2): qty 30, 30d supply, fill #1
  Filled ????-??-??: fill #0

## 2024-03-02 MED ORDER — ONDANSETRON 4 MG PO TBDP
4.0000 mg | ORAL_TABLET | Freq: Three times a day (TID) | ORAL | 1 refills | Status: AC | PRN
  Filled 2024-07-30: qty 90, 30d supply, fill #0

## 2024-03-02 MED ORDER — OMEPRAZOLE 20 MG PO CPDR: DELAYED_RELEASE_CAPSULE | ORAL | 1 refills | Status: DC

## 2024-03-02 NOTE — Assessment & Plan Note (Signed)
 Not doing well. Discussed getting her into neuropsych testing. Will get her into neurology for 2nd opinion. Referral placed today.

## 2024-03-02 NOTE — Progress Notes (Signed)
 BP 113/80   Pulse 92   Temp 98 F (36.7 C) (Oral)   Ht 5' 1 (1.549 m)   Wt 176 lb 7.5 oz (80 kg)   SpO2 96%   BMI 33.34 kg/m    Subjective:    Patient ID: Pamela Avila, female    DOB: 16-Apr-1971, 53 y.o.   MRN: [id number]  HPI: Pamela Avila is a 53 y.o. female presenting on 03/02/2024 for comprehensive medical examination. Current medical complaints include:  Has a disability appointment coming up in August. She is concerned because she's got both migraines and post-concussion syndrome and she's worried about that coming across with through the notes. She notes that her adderall is not helping with her ADD so she's planning on stopping it since she's not working. She notes that she's having issues with word finding and getting the words that she is planning on using to come out appropriately. She is concerned about whether her post-concussion is causing this or if she's having more of an issue with her memory. She notes that she also has been having issues with her balance and has had at least 3 had falls in the past year.   HYPOTHYROIDISM Thyroid  control status:unsure Satisfied with current treatment? unsure Medication side effects: no Medication compliance: good compliance Recent dose adjustment:yes Fatigue: yes Cold intolerance: yes Heat intolerance: yes Weight gain: no Weight loss: no Constipation: no Diarrhea/loose stools: no Palpitations: no Lower extremity edema: no Anxiety/depressed mood: yes  HYPERTENSION  Hypertension status: controlled  Satisfied with current treatment? yes Duration of hypertension: chronic BP monitoring frequency:  not checking BP medication side effects:  no Medication compliance: excellent compliance Previous BP meds: metoprolol  Aspirin: no Recurrent headaches: no Visual changes: no Palpitations: no Dyspnea: no Chest pain: no Lower extremity edema: no Dizzy/lightheaded: no  Menopausal Symptoms: no  Depression Screen done today  and results listed below:     03/02/2024    3:22 PM 06/18/2023    4:28 PM 04/09/2023    4:20 PM 03/08/2023    3:40 PM 01/08/2023    4:23 PM  Depression screen PHQ 2/9  Decreased Interest 1 1 0 0 0  Down, Depressed, Hopeless 1 1 0 0 1  PHQ - 2 Score 2 2 0 0 1  Altered sleeping 3 3 2 2 1   Tired, decreased energy 3 2 2 2 1   Change in appetite 0 0 0 0 0  Feeling bad or failure about yourself  0 0 0 0 0  Trouble concentrating 3 2 3 3 3   Moving slowly or fidgety/restless 3 0 0 0 0  Suicidal thoughts 0 0 0 0 0  PHQ-9 Score 14 9 7 7 6   Difficult doing work/chores Extremely dIfficult Not difficult at all Very difficult Somewhat difficult Extremely dIfficult    Past Medical History:  Past Medical History:  Diagnosis Date   ADHD (attention deficit hyperactivity disorder)    Anemia    Anxiety    Anxiety disorder    Complication of anesthesia    Depression    Family history of diabetes mellitus    GERD (gastroesophageal reflux disease)    Gestational diabetes 1993   Headache    HSV (herpes simplex virus) infection    Hypertension    Hypothyroidism    IBS (irritable bowel syndrome)    Lab test positive for detection of COVID-19 virus    PONV (postoperative nausea and vomiting)    Sleep apnea    Thyroid  disease  hypothroidsim    Surgical History:  Past Surgical History:  Procedure Laterality Date   BIOPSY  03/20/2023   Procedure: BIOPSY;  Surgeon: Therisa Bi, MD;  Location: [facility] ENDOSCOPY;  Service: Gastroenterology;;   BREAST EXCISIONAL BIOPSY Right 2010   neg surgical breast bx     BREAST SURGERY Right    lumpectomy   CESAREAN SECTION     x 2   COLONOSCOPY WITH PROPOFOL  N/A 12/28/2016   Procedure: COLONOSCOPY WITH PROPOFOL ;  Surgeon: Therisa Bi, MD;  Location: [facility] ENDOSCOPY;  Service: Endoscopy;  Laterality: N/A;   COLONOSCOPY WITH PROPOFOL  N/A 03/20/2023   Procedure: COLONOSCOPY WITH PROPOFOL ;  Surgeon: Therisa Bi, MD;  Location: [facility] ENDOSCOPY;  Service:  Gastroenterology;  Laterality: N/A;   CRYOTHERAPY  20 + years ago   cervix   DILITATION & CURRETTAGE/HYSTROSCOPY WITH NOVASURE ABLATION N/A 04/11/2015   Procedure: DILATATION & CURETTAGE/HYSTEROSCOPY WITH NOVASURE ABLATION;  Surgeon: Gladis DELENA Dollar, MD;  Location: [facility] ORS;  Service: Gynecology;  Laterality: N/A;   ESOPHAGOGASTRODUODENOSCOPY (EGD) WITH PROPOFOL  N/A 12/28/2016   Procedure: ESOPHAGOGASTRODUODENOSCOPY (EGD) WITH PROPOFOL ;  Surgeon: Therisa Bi, MD;  Location: [facility] ENDOSCOPY;  Service: Endoscopy;  Laterality: N/A;   ESSURE TUBAL LIGATION     lumpectomy Right    MASS EXCISION Right 01/30/2023   Procedure: EXCISION MASS, soft tissue right buttock;  Surgeon: Lane Shope, MD;  Location: [facility] ORS;  Service: General;  Laterality: Right;   NASAL SEPTOPLASTY W/ TURBINOPLASTY Bilateral 10/07/2018   Procedure: SEPTOPLASTY WITH BILATERAL SUBMUCOUS RESECTION OF INFERIOR TURBINATES;  Surgeon: Herminio Miu, MD;  Location: [facility] ORS;  Service: ENT;  Laterality: Bilateral;   TONSILLECTOMY     TUBAL LIGATION      Medications:  Current Outpatient Medications on File Prior to Visit  Medication Sig   amphetamine -dextroamphetamine  (ADDERALL XR) 20 MG 24 hr capsule Take 40 mg by mouth daily.   busPIRone  (BUSPAR ) 5 MG tablet Take by mouth.   escitalopram  (LEXAPRO ) 20 MG tablet Take 20 mg by mouth daily.   levothyroxine  (SYNTHROID ) 112 MCG tablet Take 1 tablet (112 mcg total) by mouth daily.   magnesium gluconate (MAGONATE) 500 MG tablet Take 500 mg by mouth 1 day or 1 dose.   meclizine  (ANTIVERT ) 25 MG tablet Take 1 tablet (25 mg total) by mouth 3 (three) times daily as needed for dizziness.   naratriptan (AMERGE) 2.5 MG tablet Take 1 tab at onset of migraine. May repeat in 2 hours if needed. Max 2/day and 2-3 days/wk.   nortriptyline (PAMELOR) 10 MG capsule Take 25 mg by mouth at bedtime.   Omega-3 Fatty Acids (FISH OIL) 1000 MG CAPS Take by mouth.   QULIPTA 60 MG TABS Take 1 tablet by  mouth daily.   No current facility-administered medications on file prior to visit.    Allergies:  Allergies  Allergen Reactions   Erythromycin Itching   Bactrim  [Sulfamethoxazole -Trimethoprim ] Rash    Social History:  Social History   Socioeconomic History   Marital status: Significant Other    Spouse name: JEREMY   Number of children: Not on file   Years of education: Not on file   Highest education level: Some college, no degree  Occupational History   Not on file  Tobacco Use   Smoking status: Some Days    Current packs/day: 0.50    Types: Cigarettes    Passive exposure: Past   Smokeless tobacco: Never  Vaping Use   Vaping status: Former  Substance and Sexual Activity   Alcohol  use: Yes    Alcohol/week: 12.0 standard drinks of alcohol    Types: 12 Standard drinks or equivalent per week   Drug use: Not Currently    Types: Marijuana, Cocaine    Comment: Occasional, hx of cocaine in past   Sexual activity: Yes  Other Topics Concern   Not on file  Social History Narrative   Not on file   Social Drivers of Health   Financial Resource Strain: Low Risk  (03/02/2024)   Overall Financial Resource Strain (CARDIA)    Difficulty of Paying Living Expenses: Not very hard  Food Insecurity: No Food Insecurity (03/02/2024)   Hunger Vital Sign    Worried About Running Out of Food in the Last Year: Never true    Ran Out of Food in the Last Year: Never true  Transportation Needs: No Transportation Needs (03/02/2024)   PRAPARE - Administrator, Civil Service (Medical): No    Lack of Transportation (Non-Medical): No  Physical Activity: Inactive (03/02/2024)   Exercise Vital Sign    Days of Exercise per Week: 0 days    Minutes of Exercise per Session: 0 min  Stress: No Stress Concern Present (03/02/2024)   [employer] of Occupational Health - Occupational Stress Questionnaire    Feeling of Stress: Not at all  Social Connections: Moderately Isolated (03/02/2024)    Social Connection and Isolation Panel    Frequency of Communication with Friends and Family: More than three times a week    Frequency of Social Gatherings with Friends and Family: Once a week    Attends Religious Services: Never    Database administrator or Organizations: No    Attends Banker Meetings: Never    Marital Status: Living with partner  Intimate Partner Violence: Not At Risk (03/02/2024)   Humiliation, Afraid, Rape, and Kick questionnaire    Fear of Current or Ex-Partner: No    Emotionally Abused: No    Physically Abused: No    Sexually Abused: No   Social History   Tobacco Use  Smoking Status Some Days   Current packs/day: 0.50   Types: Cigarettes   Passive exposure: Past  Smokeless Tobacco Never   Social History   Substance and Sexual Activity  Alcohol Use Yes   Alcohol/week: 12.0 standard drinks of alcohol   Types: 12 Standard drinks or equivalent per week    Family History:  Family History  Problem Relation Age of Onset   Hypothyroidism Paternal Grandfather    Breast cancer Paternal Grandmother    Cancer Paternal Grandmother        breast   Breast cancer Maternal Grandmother    Hyperlipidemia Maternal Grandfather    Diabetes Father    Obesity Father    Hypertension Father    Hyperlipidemia Father    COPD Father    Ovarian cancer Paternal Aunt        ?   Cancer Paternal Aunt        breast and cervical   Breast cancer Paternal Aunt    Hypothyroidism Mother    Breast cancer Son    Cancer Son        breast   Colon cancer Neg Hx     Past medical history, surgical history, medications, allergies, family history and social history reviewed with patient today and changes made to appropriate areas of the chart.   Review of Systems  Constitutional:  Positive for diaphoresis and malaise/fatigue. Negative for chills, fever and weight  loss.  HENT: Negative.    Eyes:  Positive for blurred vision and double vision. Negative for  photophobia, pain, discharge and redness.  Respiratory: Negative.    Cardiovascular: Negative.   Gastrointestinal:  Positive for constipation, heartburn and nausea. Negative for abdominal pain, blood in stool, diarrhea, melena and vomiting.  Genitourinary:  Positive for dysuria. Negative for flank pain, frequency, hematuria and urgency.  Musculoskeletal: Negative.   Skin: Negative.   Neurological:  Positive for speech change and headaches. Negative for dizziness, tingling, tremors, sensory change, focal weakness, seizures, loss of consciousness and weakness.  Endo/Heme/Allergies:  Positive for environmental allergies. Negative for polydipsia. Bruises/bleeds easily.  Psychiatric/Behavioral:  Positive for memory loss. Negative for depression, hallucinations, substance abuse and suicidal ideas. The patient is not nervous/anxious and does not have insomnia.    All other ROS negative except what is listed above and in the HPI.      Objective:    BP 113/80   Pulse 92   Temp 98 F (36.7 C) (Oral)   Ht 5' 1 (1.549 m)   Wt 176 lb 7.5 oz (80 kg)   SpO2 96%   BMI 33.34 kg/m   Wt Readings from Last 3 Encounters:  03/02/24 176 lb 7.5 oz (80 kg)  10/11/23 182 lb 9.6 oz (82.8 kg)  06/18/23 170 lb 3.2 oz (77.2 kg)    Physical Exam Vitals and nursing note reviewed.  Constitutional:      General: She is not in acute distress.    Appearance: Normal appearance. She is not ill-appearing, toxic-appearing or diaphoretic.  HENT:     Head: Normocephalic and atraumatic.     Right Ear: Tympanic membrane, ear canal and external ear normal. There is no impacted cerumen.     Left Ear: Tympanic membrane, ear canal and external ear normal. There is no impacted cerumen.     Nose: Nose normal. No congestion or rhinorrhea.     Mouth/Throat:     Mouth: Mucous membranes are moist.     Pharynx: Oropharynx is clear. No oropharyngeal exudate or posterior oropharyngeal erythema.  Eyes:     General: No scleral  icterus.       Right eye: No discharge.        Left eye: No discharge.     Extraocular Movements: Extraocular movements intact.     Conjunctiva/sclera: Conjunctivae normal.     Pupils: Pupils are equal, round, and reactive to light.  Neck:     Vascular: No carotid bruit.  Cardiovascular:     Rate and Rhythm: Normal rate and regular rhythm.     Pulses: Normal pulses.     Heart sounds: No murmur heard.    No friction rub. No gallop.  Pulmonary:     Effort: Pulmonary effort is normal. No respiratory distress.     Breath sounds: Normal breath sounds. No stridor. No wheezing, rhonchi or rales.  Chest:     Chest wall: No tenderness.  Abdominal:     General: Abdomen is flat. Bowel sounds are normal. There is no distension.     Palpations: Abdomen is soft. There is no mass.     Tenderness: There is no abdominal tenderness. There is no right CVA tenderness, left CVA tenderness, guarding or rebound.     Hernia: No hernia is present.  Genitourinary:    Comments: Breast and pelvic exams deferred with shared decision making Musculoskeletal:        General: No swelling, tenderness, deformity or signs of injury.  Cervical back: Normal range of motion and neck supple. No rigidity. No muscular tenderness.     Right lower leg: No edema.     Left lower leg: No edema.  Lymphadenopathy:     Cervical: No cervical adenopathy.  Skin:    General: Skin is warm and dry.     Capillary Refill: Capillary refill takes less than 2 seconds.     Coloration: Skin is not jaundiced or pale.     Findings: No bruising, erythema, lesion or rash.  Neurological:     General: No focal deficit present.     Mental Status: She is alert and oriented to person, place, and time. Mental status is at baseline.     Cranial Nerves: No cranial nerve deficit.     Sensory: No sensory deficit.     Motor: No weakness.     Coordination: Coordination normal.     Gait: Gait normal.     Deep Tendon Reflexes: Reflexes normal.   Psychiatric:        Mood and Affect: Mood normal.        Behavior: Behavior normal.        Thought Content: Thought content normal.        Judgment: Judgment normal.     Results for orders placed or performed in visit on 03/02/24  Microalbumin, Urine Waived   Collection Time: 03/02/24  3:28 PM  Result Value Ref Range   Microalb, Ur Waived 80 (H) 0 - 19 mg/L   Creatinine, Urine Waived 300 10 - 300 mg/dL   Microalb/Creat Ratio 30-300 (H) <30 mg/g  Bayer DCA Hb A1c Waived   Collection Time: 03/02/24  3:28 PM  Result Value Ref Range   HB A1C (BAYER DCA - WAIVED) 5.6 4.8 - 5.6 %      Assessment & Plan:   Problem List Items Addressed This Visit       Cardiovascular and Mediastinum   Benign essential hypertension   Under good control on current regimen. Continue current regimen. Continue to monitor. Call with any concerns. Refills given. Labs drawn today.        Relevant Medications   metoprolol  succinate (TOPROL -XL) 50 MG 24 hr tablet   Other Relevant Orders   Microalbumin, Urine Waived (Completed)     Endocrine   Hypothyroidism   Rechecking labs today. Await results. Treat as needed.       Relevant Medications   metoprolol  succinate (TOPROL -XL) 50 MG 24 hr tablet     Nervous and Auditory   Postconcussion syndrome   Not doing well. Discussed getting her into neuropsych testing. Will get her into neurology for 2nd opinion. Referral placed today.       Relevant Orders   Ambulatory referral to Neurology   Ambulatory referral to Physical Therapy     Other   Tobacco user   Referral for low dose CT ordered today.       Relevant Orders   Ambulatory Referral Lung Cancer Screening [facility]   Family history of diabetes mellitus   Labs drawn today. Await results.       Relevant Orders   Bayer DCA Hb A1c Waived (Completed)   Other Visit Diagnoses       Routine general medical examination at a health care facility    -  Primary   Vaccines up to  date/declined. Screening labs checked today. Pap and colonoscopy up to date. Mammogram ordered today. Conitnue diet/exercise. Call w/ concerns.   Relevant Orders  CBC with Differential/Platelet   Comprehensive metabolic panel with GFR   Lipid Panel w/o Chol/HDL Ratio   TSH   Hepatitis B surface antibody,quantitative     Balance problem       Referral to physical therapy placed today.   Relevant Orders   Ambulatory referral to Physical Therapy     Dysuria       Will check urine. Await results. Treat as needed.   Relevant Orders   Urinalysis, Routine w reflex microscopic        Follow up plan: Return in about 6 weeks (around 04/13/2024).   LABORATORY TESTING:  - Pap smear: up to date  IMMUNIZATIONS:   - Tdap: Tetanus vaccination status reviewed: up to date - Influenza: Postponed to flu season - Pneumovax: Up to date - Prevnar: Not applicable - COVID: Refused - HPV: Refused - Shingrix vaccine: Refused  SCREENING: -Mammogram: Ordered today  - Colonoscopy: Up to date   PATIENT COUNSELING:   Advised to take 1 mg of folate supplement per day if capable of pregnancy.   Sexuality: Discussed sexually transmitted diseases, partner selection, use of condoms, avoidance of unintended pregnancy  and contraceptive alternatives.   Advised to avoid cigarette smoking.  I discussed with the patient that most people either abstain from alcohol or drink within safe limits (<=14/week and <=4 drinks/occasion for males, <=7/weeks and <= 3 drinks/occasion for females) and that the risk for alcohol disorders and other health effects rises proportionally with the number of drinks per week and how often a drinker exceeds daily limits.  Discussed cessation/primary prevention of drug use and availability of treatment for abuse.   Diet: Encouraged to adjust caloric intake to maintain  or achieve ideal body weight, to reduce intake of dietary saturated fat and total fat, to limit sodium intake by  avoiding high sodium foods and not adding table salt, and to maintain adequate dietary potassium and calcium preferably from fresh fruits, vegetables, and low-fat dairy products.    stressed the importance of regular exercise  Injury prevention: Discussed safety belts, safety helmets, smoke detector, smoking near bedding or upholstery.   Dental health: Discussed importance of regular tooth brushing, flossing, and dental visits.    NEXT PREVENTATIVE PHYSICAL DUE IN 1 YEAR. Return in about 6 weeks (around 04/13/2024).

## 2024-03-02 NOTE — Assessment & Plan Note (Signed)
 Labs drawn today. Await results.

## 2024-03-02 NOTE — Assessment & Plan Note (Signed)
 Rechecking labs today. Await results. Treat as needed.

## 2024-03-02 NOTE — Assessment & Plan Note (Signed)
 Under good control on current regimen. Continue current regimen. Continue to monitor. Call with any concerns. Refills given. Labs drawn today.

## 2024-03-02 NOTE — Assessment & Plan Note (Signed)
 Referral for low dose CT ordered today.

## 2024-03-03 ENCOUNTER — Encounter: Payer: Self-pay | Admitting: Family Medicine

## 2024-03-03 DIAGNOSIS — R300 Dysuria: Secondary | ICD-10-CM | POA: Diagnosis not present

## 2024-03-03 LAB — URINALYSIS, ROUTINE W REFLEX MICROSCOPIC
Bilirubin, UA: NEGATIVE
Glucose, UA: NEGATIVE
Ketones, UA: NEGATIVE
Leukocytes,UA: NEGATIVE
Nitrite, UA: NEGATIVE
Protein,UA: NEGATIVE
RBC, UA: NEGATIVE
Specific Gravity, UA: >1.03 — ABNORMAL HIGH (ref 1.005–1.030)
Urobilinogen, Ur: 0.2 mg/dL (ref 0.2–1.0)
pH, UA: 6 (ref 5.0–7.5)

## 2024-03-03 LAB — COMPREHENSIVE METABOLIC PANEL WITH GFR
ALT: 45 IU/L — ABNORMAL HIGH (ref 0–32)
AST: 32 IU/L (ref 0–40)
Albumin: 4.4 g/dL (ref 3.8–4.9)
Alkaline Phosphatase: 103 IU/L (ref 44–121)
BUN/Creatinine Ratio: 19 (ref 9–23)
BUN: 22 mg/dL (ref 6–24)
Bilirubin Total: 0.4 mg/dL (ref 0.0–1.2)
CO2: 21 mmol/L (ref 20–29)
Calcium: 9.5 mg/dL (ref 8.7–10.2)
Chloride: 103 mmol/L (ref 96–106)
Creatinine, Ser: 1.13 mg/dL — ABNORMAL HIGH (ref 0.57–1.00)
Globulin, Total: 2.4 g/dL (ref 1.5–4.5)
Glucose: 159 mg/dL — ABNORMAL HIGH (ref 70–99)
Potassium: 4.2 mmol/L (ref 3.5–5.2)
Sodium: 140 mmol/L (ref 134–144)
Total Protein: 6.8 g/dL (ref 6.0–8.5)
eGFR: 59 mL/min/1.73 — ABNORMAL LOW (ref >59)

## 2024-03-03 LAB — CBC WITH DIFFERENTIAL/PLATELET
Basophils Absolute: 0.1 x10E3/uL (ref 0.0–0.2)
Basos: 1 %
EOS (ABSOLUTE): 0.2 x10E3/uL (ref 0.0–0.4)
Eos: 4 %
Hematocrit: 44.6 % (ref 34.0–46.6)
Hemoglobin: 14.8 g/dL (ref 11.1–15.9)
Immature Grans (Abs): 0 x10E3/uL (ref 0.0–0.1)
Immature Granulocytes: 0 %
Lymphocytes Absolute: 2.4 x10E3/uL (ref 0.7–3.1)
Lymphs: 41 %
MCH: 31.6 pg (ref 26.6–33.0)
MCHC: 33.2 g/dL (ref 31.5–35.7)
MCV: 95 fL (ref 79–97)
Monocytes Absolute: 0.4 x10E3/uL (ref 0.1–0.9)
Monocytes: 7 %
Neutrophils Absolute: 2.7 x10E3/uL (ref 1.4–7.0)
Neutrophils: 47 %
Platelets: 274 x10E3/uL (ref 150–450)
RBC: 4.68 x10E6/uL (ref 3.77–5.28)
RDW: 15 % (ref 11.7–15.4)
WBC: 5.8 x10E3/uL (ref 3.4–10.8)

## 2024-03-03 LAB — LIPID PANEL W/O CHOL/HDL RATIO
Cholesterol, Total: 219 mg/dL — ABNORMAL HIGH (ref 100–199)
HDL: 57 mg/dL (ref >39)
LDL Chol Calc (NIH): 130 mg/dL — ABNORMAL HIGH (ref 0–99)
Triglycerides: 180 mg/dL — ABNORMAL HIGH (ref 0–149)
VLDL Cholesterol Cal: 32 mg/dL (ref 5–40)

## 2024-03-03 LAB — TSH: TSH: 4.4 u[IU]/mL (ref 0.450–4.500)

## 2024-03-03 LAB — HEPATITIS B SURFACE ANTIBODY, QUANTITATIVE: Hepatitis B Surf Ab Quant: <3.5 m[IU]/mL — ABNORMAL LOW

## 2024-03-04 ENCOUNTER — Ambulatory Visit: Payer: Self-pay | Admitting: Family Medicine

## 2024-03-04 MED ORDER — LEVOTHYROXINE SODIUM 112 MCG PO TABS: 112.0000 ug | ORAL_TABLET | Freq: Every day | ORAL | 3 refills | Status: DC

## 2024-03-31 DIAGNOSIS — H5332 Fusion with defective stereopsis: Secondary | ICD-10-CM | POA: Diagnosis not present

## 2024-04-14 ENCOUNTER — Encounter: Payer: Self-pay | Admitting: Family Medicine

## 2024-04-14 ENCOUNTER — Ambulatory Visit: Admitting: Family Medicine

## 2024-04-14 VITALS — Wt 166.0 lb

## 2024-04-14 DIAGNOSIS — E038 Other specified hypothyroidism: Secondary | ICD-10-CM | POA: Diagnosis not present

## 2024-04-14 DIAGNOSIS — F901 Attention-deficit hyperactivity disorder, predominantly hyperactive type: Secondary | ICD-10-CM | POA: Diagnosis not present

## 2024-04-14 DIAGNOSIS — N289 Disorder of kidney and ureter, unspecified: Secondary | ICD-10-CM | POA: Diagnosis not present

## 2024-04-14 DIAGNOSIS — K580 Irritable bowel syndrome with diarrhea: Secondary | ICD-10-CM

## 2024-04-14 DIAGNOSIS — R2689 Other abnormalities of gait and mobility: Secondary | ICD-10-CM | POA: Diagnosis not present

## 2024-04-14 DIAGNOSIS — F419 Anxiety disorder, unspecified: Secondary | ICD-10-CM

## 2024-04-14 DIAGNOSIS — F32A Depression, unspecified: Secondary | ICD-10-CM

## 2024-04-14 DIAGNOSIS — F0781 Postconcussional syndrome: Secondary | ICD-10-CM | POA: Diagnosis not present

## 2024-04-14 DIAGNOSIS — R159 Full incontinence of feces: Secondary | ICD-10-CM

## 2024-04-14 DIAGNOSIS — W57XXXA Bitten or stung by nonvenomous insect and other nonvenomous arthropods, initial encounter: Secondary | ICD-10-CM | POA: Diagnosis not present

## 2024-04-14 MED ORDER — BUPROPION HCL ER (SR) 150 MG PO TB12
ORAL_TABLET | ORAL | 2 refills | Status: DC
Start: 2024-04-14 — End: 2024-05-19

## 2024-04-14 MED ORDER — PREDNISONE 10 MG PO TABS: ORAL_TABLET | ORAL | 0 refills | Status: DC

## 2024-04-14 MED ORDER — TRIAMCINOLONE ACETONIDE 40 MG/ML IJ SUSP
40.0000 mg | Freq: Once | INTRAMUSCULAR | Status: AC
  Administered 2024-04-14: 40 mg via INTRAMUSCULAR

## 2024-04-14 NOTE — Progress Notes (Signed)
 Wt 166 lb (75.3 kg)   BMI 31.37 kg/m    Subjective:    Patient ID: Pamela Avila, female    DOB: 1971/06/22, 53 y.o.   MRN: [id number]  HPI: [name] is a 53 y.o. female  Chief Complaint  Patient presents with   Depression   She notes that she gets hot and cold quite regularly. 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 She has brought in paperwork that she put together with it- see scanned document.  She has continued with bowl incontinence at night. She had a colonoscopy which looked normal. She wears depends and sleeps on a pad. This has been a huge issue for her both physically and mentally= she's feeling really terrible with it.   ANXIETY/DEPRESSION- Had her long term disability cancelled in August. Has been having issues with her neuropsychology. She has been feeling more down.  Duration: chronic Status:uncontrolled Anxious mood: yes  Excessive worrying: yes Irritability: yes  Sweating: no Nausea: no Palpitations:no Hyperventilation: no Panic attacks: no Agoraphobia: no  Obscessions/compulsions: no Depressed mood: yes    04/14/2024    2:02 PM 03/02/2024    3:22 PM 06/18/2023    4:28 PM 04/09/2023    4:20 PM 03/08/2023    3:40 PM  Depression screen PHQ 2/9  Decreased Interest 0 1 1 0 0  Down, Depressed, Hopeless 2 1 1  0 0  PHQ - 2 Score 2 2 2  0 0  Altered sleeping 1 3 3 2 2   Tired, decreased energy 3 3 2 2 2   Change in appetite 1 0 0 0 0  Feeling bad or failure about yourself  0 0 0 0 0  Trouble concentrating 3 3 2 3 3   Moving slowly or fidgety/restless 2 3 0 0 0  Suicidal thoughts 0 0 0 0 0  PHQ-9 Score 12 14 9 7 7   Difficult doing work/chores Very difficult Extremely dIfficult Not difficult at all Very difficult Somewhat difficult   Anhedonia: yes Weight changes: no Insomnia: yes hard to fall asleep  Hypersomnia: yes Fatigue/loss of energy: yes Feelings of worthlessness: yes Feelings of guilt: yes Impaired concentration/indecisiveness:  yes Suicidal ideations: no  Crying spells: no Recent Stressors/Life Changes: yes  HYPOTHYROIDISM Thyroid  control status:stable Satisfied with current treatment? yes Medication side effects: no Medication compliance: excellent compliance Recent dose adjustment:no Fatigue: yes Cold intolerance: no Heat intolerance: no Weight gain: no Weight loss: no Constipation: no Diarrhea/loose stools: no Palpitations: no Lower extremity edema: no Anxiety/depressed mood: yes  RASH Duration:  days  Location: legs and belly  Itching: yes Burning: yes Redness: yes Oozing: no Scaling: no Blisters: no Painful: no Fevers: no Change in detergents/soaps/personal care products: no Recent illness: no Recent travel:no History of same: yes Context: stable Alleviating factors: nothing Treatments attempted:lotion/moisturizer Shortness of breath: no  Throat/tongue swelling: no Myalgias/arthralgias: no   Relevant past medical, surgical, family and social history reviewed and updated as indicated. Interim medical history since our last visit reviewed. Allergies and medications reviewed and updated.  Review of Systems  Constitutional:  Positive for fatigue. Negative for activity change, appetite change, chills, diaphoresis, fever and unexpected weight change.  Respiratory: Negative.    Cardiovascular: Negative.   Neurological:  Positive for dizziness, speech difficulty and headaches. Negative for tremors, seizures, syncope, facial asymmetry, weakness, light-headedness and numbness.  Psychiatric/Behavioral:  Positive for confusion and dysphoric mood. Negative for agitation, behavioral problems, decreased concentration, hallucinations, self-injury, sleep disturbance and suicidal ideas. The patient is  nervous/anxious. The patient is not hyperactive.     Per HPI unless specifically indicated above     Objective:    Wt 166 lb (75.3 kg)   BMI 31.37 kg/m   Wt Readings from Last 3 Encounters:   04/14/24 166 lb (75.3 kg)  03/02/24 176 lb 7.5 oz (80 kg)  10/11/23 182 lb 9.6 oz (82.8 kg)    Physical Exam Vitals and nursing note reviewed.  Constitutional:      General: She is not in acute distress.    Appearance: Normal appearance. She is not ill-appearing, toxic-appearing or diaphoretic.  HENT:     Head: Normocephalic and atraumatic.     Right Ear: External ear normal.     Left Ear: External ear normal.     Nose: Nose normal.     Mouth/Throat:     Mouth: Mucous membranes are moist.     Pharynx: Oropharynx is clear.  Eyes:     General: No scleral icterus.       Right eye: No discharge.        Left eye: No discharge.     Extraocular Movements: Extraocular movements intact.     Conjunctiva/sclera: Conjunctivae normal.     Pupils: Pupils are equal, round, and reactive to light.  Cardiovascular:     Rate and Rhythm: Normal rate and regular rhythm.     Pulses: Normal pulses.     Heart sounds: Normal heart sounds. No murmur heard.    No friction rub. No gallop.  Pulmonary:     Effort: Pulmonary effort is normal. No respiratory distress.     Breath sounds: Normal breath sounds. No stridor. No wheezing, rhonchi or rales.  Chest:     Chest wall: No tenderness.  Musculoskeletal:        General: Normal range of motion.     Cervical back: Normal range of motion and neck supple.  Skin:    General: Skin is warm and dry.     Capillary Refill: Capillary refill takes less than 2 seconds.     Coloration: Skin is not jaundiced or pale.     Findings: No bruising, erythema, lesion or rash.     Comments: Numerous small erythematous papules on legs and abdomen  Neurological:     General: No focal deficit present.     Mental Status: She is alert and oriented to person, place, and time. Mental status is at baseline.  Psychiatric:        Mood and Affect: Mood normal. Affect is tearful.        Behavior: Behavior normal.        Thought Content: Thought content normal.        Judgment:  Judgment normal.     Results for orders placed or performed in visit on 04/14/24  Basic metabolic panel with GFR   Collection Time: 04/14/24  2:53 PM  Result Value Ref Range   Glucose 92 70 - 99 mg/dL   BUN 12 6 - 24 mg/dL   Creatinine, Ser 9.10 0.57 - 1.00 mg/dL   eGFR 78 >40 fO/fpw/8.26   BUN/Creatinine Ratio 13 9 - 23   Sodium 138 134 - 144 mmol/L   Potassium 4.4 3.5 - 5.2 mmol/L   Chloride 99 96 - 106 mmol/L   CO2 21 20 - 29 mmol/L   Calcium 10.0 8.7 - 10.2 mg/dL  TSH   Collection Time: 04/14/24  2:53 PM  Result Value Ref Range   TSH 4.480 0.450 -  4.500 uIU/mL      Assessment & Plan:   Problem List Items Addressed This Visit       Digestive   Irritable bowel syndrome   Normal work up with GI. Will get her into pelvic PT. Causing significant debility. Continue to monitor closely. Call with any concerns.       Relevant Orders   AMB Referral [facility] Care Management     Endocrine   Hypothyroidism   Rechecking labs today. Await results. Treat as needed.       Relevant Orders   TSH (Completed)     Nervous and Auditory   Postconcussion syndrome - Primary   Has been having some issues with neuropsychiatry. Would like a new referral over to the post-concussive clinic. Referral placed today. Await their input.       Relevant Orders   Ambulatory referral to Neurology   AMB Referral [facility] Care Management     Other   ADHD (attention deficit hyperactivity disorder)   Has been having some issues with neuropsychiatry. Would like a new referral over to the post-concussive clinic. Referral placed today. Await their input.       Anxiety and depression   Has been having some issues with neuropsychiatry. Would like a new referral over to the post-concussive clinic. Referral placed today. Await their input.       Relevant Medications   buPROPion  (WELLBUTRIN  SR) 150 MG 12 hr tablet   Other Relevant Orders   AMB Referral [facility] Care Management   Full incontinence of feces    Normal work up with GI. Will get her into pelvic PT. Causing significant debility. Continue to monitor closely. Call with any concerns.       Relevant Orders   Ambulatory referral to Physical Therapy   Other Visit Diagnoses       Balance problem       Relevant Orders   Ambulatory referral to Neurology   AMB Referral [facility] Care Management     Abnormal kidney function       Rechecking labs today. Await results. Treat as needed.   Relevant Orders   Basic metabolic panel with GFR (Completed)   AMB Referral [facility] Care Management     Bug bite, initial encounter       Will treat with steroid shot followed by steroid taper. Call with any concerns or if not getting better.   Relevant Medications   triamcinolone  acetonide (KENALOG -40) injection 40 mg (Completed)        Follow up plan: Return in about 6 weeks (around 05/26/2024).

## 2024-04-15 ENCOUNTER — Telehealth: Payer: Self-pay

## 2024-04-15 LAB — BASIC METABOLIC PANEL WITH GFR
BUN/Creatinine Ratio: 13 (ref 9–23)
BUN: 12 mg/dL (ref 6–24)
CO2: 21 mmol/L (ref 20–29)
Calcium: 10 mg/dL (ref 8.7–10.2)
Chloride: 99 mmol/L (ref 96–106)
Creatinine, Ser: 0.89 mg/dL (ref 0.57–1.00)
Glucose: 92 mg/dL (ref 70–99)
Potassium: 4.4 mmol/L (ref 3.5–5.2)
Sodium: 138 mmol/L (ref 134–144)
eGFR: 78 mL/min/1.73 (ref >59)

## 2024-04-15 LAB — TSH: TSH: 4.48 u[IU]/mL (ref 0.450–4.500)

## 2024-04-15 NOTE — Progress Notes (Signed)
 Complex Care Management Note Care Guide Note  04/15/2024 Name: Pamela Avila MRN: [id number] DOB: [birth year]   Complex Care Management Outreach Attempts: An unsuccessful telephone outreach was attempted today to offer the patient information about available complex care management services.  Follow Up Plan:  Additional outreach attempts will be made to offer the patient complex care management information and services.   Encounter Outcome:  No Answer  Jeoffrey Buffalo , RMA     [place]  [facility], [facility] Guide  Direct Dial: [phone]  Website: [email or web address]

## 2024-04-19 ENCOUNTER — Ambulatory Visit: Payer: Self-pay | Admitting: Family Medicine

## 2024-04-19 DIAGNOSIS — R159 Full incontinence of feces: Secondary | ICD-10-CM | POA: Insufficient documentation

## 2024-04-19 DIAGNOSIS — E038 Other specified hypothyroidism: Secondary | ICD-10-CM

## 2024-04-19 MED ORDER — LEVOTHYROXINE SODIUM 125 MCG PO TABS: 125.0000 ug | ORAL_TABLET | Freq: Every day | ORAL | 0 refills | Status: DC

## 2024-04-19 NOTE — Assessment & Plan Note (Signed)
 Has been having some issues with neuropsychiatry. Would like a new referral over to the post-concussive clinic. Referral placed today. Await their input.

## 2024-04-19 NOTE — Assessment & Plan Note (Signed)
 Rechecking labs today. Await results. Treat as needed.

## 2024-04-19 NOTE — Assessment & Plan Note (Signed)
 Normal work up with GI. Will get her into pelvic PT. Causing significant debility. Continue to monitor closely. Call with any concerns.

## 2024-04-21 ENCOUNTER — Other Ambulatory Visit: Payer: Self-pay

## 2024-04-21 DIAGNOSIS — F0781 Postconcussional syndrome: Secondary | ICD-10-CM

## 2024-04-22 ENCOUNTER — Telehealth: Payer: Self-pay

## 2024-04-22 ENCOUNTER — Other Ambulatory Visit: Payer: Self-pay | Admitting: *Deleted

## 2024-04-22 NOTE — Patient Outreach (Signed)
 Complex Care Management   Visit Note  04/22/2024  Name:  Pamela Avila MRN: [id number] DOB: 1971/01/05  Situation: Referral received for Complex Care Management related to Mental/Behavioral Health diagnosis Anxiety/Depression and Balance Problem. I obtained verbal consent from Patient.  Visit completed with Ms. Ebbs and partner [place]   on the phone. Main concern is attending new referrals made by PCP for [facility] and Pelvic physical therapy, better medication management, will eventually make appointment for low dose CT scan of chest after other pertinent appointments have been attended.   Background:   Past Medical History:  Diagnosis Date   ADHD (attention deficit hyperactivity disorder)    Anemia    Anxiety    Anxiety disorder    Complication of anesthesia    Depression    Family history of diabetes mellitus    GERD (gastroesophageal reflux disease)    Gestational diabetes 1993   Headache    HSV (herpes simplex virus) infection    Hypertension    Hypothyroidism    IBS (irritable bowel syndrome)    Lab test positive for detection of COVID-19 virus    PONV (postoperative nausea and vomiting)    Sleep apnea    Thyroid  disease    hypothroidsim    Assessment: Patient Reported Symptoms:  Cognitive Cognitive Status: Alert and oriented to person, place, and time, Insightful and able to interpret abstract concepts, Other: (Speech is sometimes slow, patient needs a moment to answer questions.)      Neurological Neurological Review of Symptoms: Headaches Neurological Comment: Feels like there's a cramp on top of her head, rates pain at 3.  HEENT HEENT Symptoms Reported: No symptoms reported HEENT Management Strategies: Routine screening HEENT Self-Management Outcome: 4 (good)    Cardiovascular Cardiovascular Symptoms Reported: No symptoms reported    Respiratory Respiratory Symptoms Reported: No symptoms reported    Endocrine Endocrine Symptoms Reported: No  symptoms reported Is patient diabetic?: No Endocrine Self-Management Outcome: 4 (good) Endocrine Comment: On 04/19/24, PCP increased Levothyroxine  to 125mcg once in the AM.  Gastrointestinal Gastrointestinal Symptoms Reported: Diarrhea, Constipation Additional Gastrointestinal Details: BM's go back and forth from constipation to diarrhea, she has been having incont at night.  PCP has ordered Pelvic PT for incontinence, patient is awaiting call for appointment. Takes dicyclomine  for diarrhea. Gastrointestinal Management Strategies: Medication therapy    Genitourinary Genitourinary Symptoms Reported: No symptoms reported    Integumentary Integumentary Symptoms Reported: No symptoms reported    Musculoskeletal Musculoskelatal Symptoms Reviewed: Other Other Musculoskeletal Symptoms: Imbalance, patient describes as moments of unawareness of where she is in her environment. Musculoskeletal Self-Management Outcome: 3 (uncertain) Falls in the past year?: Yes Number of falls in past year: 2 or more Fall risk Follow up: Education provided  Psychosocial       Quality of Family Relationships: helpful, involved, supportive Do you feel physically threatened by others?: No    04/22/2024    PHQ2-9 Depression Screening   Little interest or pleasure in doing things    Feeling down, depressed, or hopeless    PHQ-2 - Total Score    Trouble falling or staying asleep, or sleeping too much    Feeling tired or having little energy    Poor appetite or overeating     Feeling bad about yourself - or that you are a failure or have let yourself or your family down    Trouble concentrating on things, such as reading the newspaper or watching television    Moving or  speaking so slowly that other people could have noticed.  Or the opposite - being so fidgety or restless that you have been moving around a lot more than usual    Thoughts that you would be better off dead, or hurting yourself in some way    PHQ2-9  Total Score    If you checked off any problems, how difficult have these problems made it for you to do your work, take care of things at home, or get along with other people    Depression Interventions/Treatment      There were no vitals filed for this visit.  Medications Reviewed Today     Reviewed by Lucian Santana LABOR, RN (Registered Nurse) on 04/21/24 at 1553  Med List Status: <None>   Medication Order Taking? Sig Documenting Provider Last Dose Status Informant  amphetamine -dextroamphetamine  (ADDERALL XR) 20 MG 24 hr capsule [id number] Yes Take 40 mg by mouth daily. [provider]  Active   buPROPion  (WELLBUTRIN  SR) 150 MG 12 hr tablet [id number] Yes Take 1 pill in the AM for 2 weeks, then increase to 1 pill BID Johnson, Megan P, DO  Active   busPIRone  (BUSPAR ) 5 MG tablet [id number] Yes Take by mouth. [provider]  Active   co-enzyme Q-10 30 MG capsule [id number]  Take 30 mg by mouth daily. [provider]  Active   dicyclomine  (BENTYL ) 10 MG capsule [id number] Yes Take 1 capsule (10 mg total) by mouth 3 (three) times daily as needed for spasms. Johnson, Megan P, DO  Active   escitalopram  (LEXAPRO ) 20 MG tablet [id number] Yes Take 20 mg by mouth daily. [provider]  Active   levothyroxine  (SYNTHROID ) 125 MCG tablet [id number] Yes Take 1 tablet (125 mcg total) by mouth daily. Johnson, Megan P, DO  Active   magnesium gluconate (MAGONATE) 500 MG tablet [id number] Yes Take 500 mg by mouth 1 day or 1 dose. [provider]  Active   meclizine  (ANTIVERT ) 25 MG tablet [id number] Yes Take 1 tablet (25 mg total) by mouth 3 (three) times daily as needed for dizziness. Johnson, Megan P, DO  Active   metoprolol  succinate (TOPROL -XL) 50 MG 24 hr tablet [id number] Yes Take 1 tablet (50 mg total) by mouth daily. GENERIC EQUIVALENT FOR TOPROL  XL Johnson, Megan P, DO  Active   naratriptan (AMERGE) 2.5 MG tablet [id number] Yes Take 1 tab at onset of migraine. May  repeat in 2 hours if needed. Max 2/day and 2-3 days/wk. [provider]  Active   nortriptyline (PAMELOR) 10 MG capsule [id number] Yes Take 25 mg by mouth at bedtime. [provider]  Active Self  Omega-3 Fatty Acids (FISH OIL) 1000 MG CAPS [id number] Yes Take by mouth. [provider]  Active   omeprazole  (PRILOSEC) 20 MG capsule [id number] Yes TAKE 1 CAPSULE TWICE A DAY AS NEEDED FOR HEARTBURN/INDIGESTION Johnson, Megan P, DO  Active   ondansetron  (ZOFRAN -ODT) 4 MG disintegrating tablet [id number] Yes Take 1 tablet (4 mg total) by mouth every 8 (eight) hours as needed. Johnson, Megan P, DO  Active   predniSONE  (DELTASONE ) 10 MG tablet [id number] Yes 6 tabs in the AM with food day 1, 5 tabs day 2, decrease by 1 every day until gone Vicci Duwaine SQUIBB, DO  Active   QULIPTA 60 MG TABS [id number] Yes Take 1 tablet by mouth daily. [provider]  Active             Recommendation:   Reviewed upcoming appointments  with patient: PCP follow up 05/26/24 Specialty provider follow-up : Chrystal Land, LCSW on 04/22/24, awaiting appointments for new referrals to [facility], Pelvic PT.   Referral to: Pharmacy to explore options for Prepackaged Medications This RNCM will add Lung Cancer screening program to care plan for patient to attend after appts with new referrals are completed.     Follow Up Plan:   Telephone follow-up in 1 week  Santana Stamp BSN, CCM [place]  [facility] RN Care Manager Direct Dial: [phone]  Fax: [phone]

## 2024-04-22 NOTE — Progress Notes (Signed)
 Care Guide Pharmacy Note  04/22/2024 Name: Pamela Avila MRN: [id number] DOB: March 03, 1971  Referred By: Vicci Duwaine SQUIBB, DO Reason for referral: Complex Care Management (Outreach to schedule with Pharm d )   Pamela Avila is a 53 y.o. year old female who is a primary care patient of Vicci Duwaine SQUIBB, DO.  Pamela Avila was referred to the pharmacist for assistance related to: postconcussion   Successful contact was made with the patient to discuss pharmacy services including being ready for the pharmacist to call at least 5 minutes before the scheduled appointment time and to have medication bottles and any blood pressure readings ready for review. The patient agreed to meet with the pharmacist via telephone visit on (date/time).05/11/2024  Jeoffrey Buffalo , RMA     [place]  [facility] Guide  Direct Dial: [phone]  Website: [email or web address]

## 2024-04-22 NOTE — Progress Notes (Signed)
 Care Guide Pharmacy Note  04/22/2024 Name: ANALEIA ISMAEL MRN: [id number] DOB: 08/29/1970  Referred By: Vicci Duwaine SQUIBB, DO Reason for referral: Complex Care Management (Outreach to schedule with Pharm d )   MILADY FLEENER is a 53 y.o. year old female who is a primary care patient of Vicci Duwaine SQUIBB, DO.  RESHMA HOEY was referred to the pharmacist for assistance related to: postconcussion syndrome   An unsuccessful telephone outreach was attempted today to contact the patient who was referred to the pharmacy team for assistance with medication management. Additional attempts will be made to contact the patient.  Jeoffrey Buffalo , RMA     [facility]  [facility], [facility] Guide  Direct Dial: [phone]  Website: [email or web address]

## 2024-04-23 NOTE — Patient Instructions (Signed)
 Visit Information  Thank you for taking time to visit with me today. Please don't hesitate to contact me if I can be of assistance to you before our next scheduled appointment.  Your next care management appointment is by telephone on 05/14/24 at 2pm   Please call the care guide team at [phone] if you need to cancel, schedule, or reschedule an appointment.   Please call the Suicide and Crisis Lifeline: 988 call the [place]  National Suicide Prevention Lifeline: (918)160-1881 or TTY: 540-626-0464 TTY (416)809-6514) to talk to a trained counselor call 1-800-273-TALK (toll free, 24 hour hotline) if you are experiencing a Mental Health or Behavioral Health Crisis or need someone to talk to. Trevino Wyatt, LCSW [place]  [facility], [facility] Licensed Clinical Social Worker  Direct Dial: [phone]

## 2024-04-23 NOTE — Patient Outreach (Signed)
 Complex Care Management   Visit Note  04/23/2024  Name:  Pamela Avila MRN: [id number] DOB: 31-Mar-1971  Situation: Referral received for Complex Care Management related to post concussion syndrome I obtained verbal consent from Patient.  Visit completed with Patient  on the phone on 04/22/24.  Background:   Past Medical History:  Diagnosis Date   ADHD (attention deficit hyperactivity disorder)    Anemia    Anxiety    Anxiety disorder    Complication of anesthesia    Depression    Family history of diabetes mellitus    GERD (gastroesophageal reflux disease)    Gestational diabetes 1993   Headache    HSV (herpes simplex virus) infection    Hypertension    Hypothyroidism    IBS (irritable bowel syndrome)    Lab test positive for detection of COVID-19 virus    PONV (postoperative nausea and vomiting)    Sleep apnea    Thyroid  disease    hypothroidsim    Assessment: Patient Reported Symptoms:  Cognitive Cognitive Status: Alert and oriented to person, place, and time, Insightful and able to interpret abstract concepts Cognitive/Intellectual Conditions Management [RPT]: Other (post concussion syndrome)   Health Maintenance Behaviors: Annual physical exam Health Facilitated by: Stress management  Neurological Neurological Review of Symptoms: Headaches Neurological Management Strategies: Adequate rest, Medication therapy  HEENT HEENT Symptoms Reported: No symptoms reported HEENT Management Strategies: Routine screening    Cardiovascular Cardiovascular Symptoms Reported: No symptoms reported    Respiratory Respiratory Symptoms Reported: No symptoms reported    Endocrine Endocrine Symptoms Reported: No symptoms reported Is patient diabetic?: No    Gastrointestinal Gastrointestinal Symptoms Reported: Constipation, Diarrhea Additional Gastrointestinal Details: patient states that she has irritable bowel and leakage at night Gastrointestinal Management Strategies: Medication  therapy    Genitourinary Genitourinary Symptoms Reported: No symptoms reported    Integumentary Integumentary Symptoms Reported: No symptoms reported    Musculoskeletal Musculoskelatal Symptoms Reviewed: No symptoms reported        Psychosocial Psychosocial Symptoms Reported: Depression - if selected complete PHQ 2-9, Anxiety - if selected complete GAD Additional Psychological Details: fell at work-post concussion sydrome as a result open to support groups Behavioral Management Strategies: Adequate rest, Community resources Major Change/Loss/Stressor/Fears (CP): Medical condition, self Techniques to Cope with Loss/Stress/Change: Diversional activities Quality of Family Relationships: supportive, involved, stressful Do you feel physically threatened by others?: No    04/23/2024    PHQ2-9 Depression Screening   Little interest or pleasure in doing things Not at all  Feeling down, depressed, or hopeless Not at all  PHQ-2 - Total Score 0  Trouble falling or staying asleep, or sleeping too much    Feeling tired or having little energy    Poor appetite or overeating     Feeling bad about yourself - or that you are a failure or have let yourself or your family down    Trouble concentrating on things, such as reading the newspaper or watching television    Moving or speaking so slowly that other people could have noticed.  Or the opposite - being so fidgety or restless that you have been moving around a lot more than usual    Thoughts that you would be better off dead, or hurting yourself in some way    PHQ2-9 Total Score    If you checked off any problems, how difficult have these problems made it for you to do your work, take care of things at home, or get along  with other people    Depression Interventions/Treatment      There were no vitals filed for this visit.  Medications Reviewed Today     Reviewed by Ermalinda Lenn HERO, LCSW (Social Worker) on 04/22/24 at 1633  Med List Status:  <None>   Medication Order Taking? Sig Documenting Provider Last Dose Status Informant  amphetamine -dextroamphetamine  (ADDERALL XR) 20 MG 24 hr capsule [id number] Yes Take 40 mg by mouth daily. [provider]  Active   buPROPion  (WELLBUTRIN  SR) 150 MG 12 hr tablet [id number] Yes Take 1 pill in the AM for 2 weeks, then increase to 1 pill BID Johnson, Megan P, DO  Active   busPIRone  (BUSPAR ) 5 MG tablet [id number] Yes Take by mouth. [provider]  Active   co-enzyme Q-10 30 MG capsule [id number] Yes Take 30 mg by mouth daily.  Patient taking differently: Take 200 mg by mouth daily.   [provider]  Active   dicyclomine  (BENTYL ) 10 MG capsule [id number] Yes Take 1 capsule (10 mg total) by mouth 3 (three) times daily as needed for spasms. Johnson, Megan P, DO  Active   escitalopram  (LEXAPRO ) 20 MG tablet [id number] Yes Take 20 mg by mouth daily. [provider]  Active   levothyroxine  (SYNTHROID ) 125 MCG tablet [id number] Yes Take 1 tablet (125 mcg total) by mouth daily. Johnson, Megan P, DO  Active   magnesium gluconate (MAGONATE) 500 MG tablet [id number] Yes Take 500 mg by mouth 1 day or 1 dose. [provider]  Active   meclizine  (ANTIVERT ) 25 MG tablet [id number] Yes Take 1 tablet (25 mg total) by mouth 3 (three) times daily as needed for dizziness. Johnson, Megan P, DO  Active   metoprolol  succinate (TOPROL -XL) 50 MG 24 hr tablet [id number] Yes Take 1 tablet (50 mg total) by mouth daily. GENERIC EQUIVALENT FOR TOPROL  XL Johnson, Megan P, DO  Active   naratriptan (AMERGE) 2.5 MG tablet [id number] Yes Take 1 tab at onset of migraine. May repeat in 2 hours if needed. Max 2/day and 2-3 days/wk. [provider]  Active   nortriptyline (PAMELOR) 10 MG capsule [id number] Yes Take 25 mg by mouth at bedtime. [provider]  Active Self  Omega-3 Fatty Acids (FISH OIL) 1000 MG CAPS [id number] Yes Take by mouth. [provider]  Active    omeprazole  (PRILOSEC) 20 MG capsule [id number] Yes TAKE 1 CAPSULE TWICE A DAY AS NEEDED FOR HEARTBURN/INDIGESTION Johnson, Megan P, DO  Active   ondansetron  (ZOFRAN -ODT) 4 MG disintegrating tablet [id number] Yes Take 1 tablet (4 mg total) by mouth every 8 (eight) hours as needed. Johnson, Megan P, DO  Active   predniSONE  (DELTASONE ) 10 MG tablet [id number] Yes 6 tabs in the AM with food day 1, 5 tabs day 2, decrease by 1 every day until gone Vicci Duwaine SQUIBB, DO  Active   QULIPTA 60 MG TABS [id number] Yes Take 1 tablet by mouth daily. [provider]  Active             Recommendation:   PCP Follow-up Specialty provider follow-up as scheduled  Follow Up Plan:   Telephone follow up appointment date/time:  05/14/24  Lenn Ermalinda, LCSW [place]  [facility], [facility] Licensed Clinical Social Worker  Direct Dial: [phone]

## 2024-04-28 ENCOUNTER — Other Ambulatory Visit: Payer: Self-pay

## 2024-04-28 ENCOUNTER — Telehealth: Payer: Self-pay

## 2024-04-28 NOTE — Patient Outreach (Signed)
 Spoke to [place] at [facility] outpatient PT, they received referral, scheduling patients out to mid-December, she will call patient to see if that's ok or if she would rather have PCP place referral elsewhere.

## 2024-04-28 NOTE — Patient Outreach (Signed)
 Attempted to contact [facility] Neurology office to check status of referral placed by Dr. Vicci, office closed at 4:30pm.  Will call tomorrow during office hours.

## 2024-04-28 NOTE — Patient Instructions (Signed)
 Visit Information  Thank you for taking time to visit with me today. Please don't hesitate to contact me if I can be of assistance to you before our next scheduled appointment.  Your next care management appointment is by telephone on Friday, September 19th at 4:00pm.    Please call the care guide team at [phone] if you need to cancel, schedule, or reschedule an appointment.   A reminder to ALL patients/family/friends, please call the [place]  National Suicide Prevention Lifeline: 610-023-8979 or TTY: 5161207933 TTY 919-237-9806) to talk to a trained counselor if you are experiencing a Mental Health or Behavioral Health Crisis or need someone to talk to.  Santana Stamp BSN, CCM [place]  VBCI Population Health RN Care Manager Direct Dial: [phone]  Fax: [phone]

## 2024-04-28 NOTE — Patient Outreach (Signed)
 Complex Care Management   Visit Note  04/28/2024  Name:  Pamela Avila MRN: [id number] DOB: 07/26/1971  Situation: Referral received for Complex Care Management related to Balance Problems, Anxiety/Depression I obtained verbal consent from Patient.  Visit completed with Patient  on the phone.  Main concern today is following up on referrals to Concussion clinic and Pelvic PT as patient has not heard from either for appointment scheduling.  Reports depression has worsened, PHQ9 today is 21. She missed a couple of days of medications this week but feels she is back on track.  She will be meeting with Pharmacist on 9/29 to discuss options for pill packaging.   Background:   Past Medical History:  Diagnosis Date   ADHD (attention deficit hyperactivity disorder)    Anemia    Anxiety    Anxiety disorder    Complication of anesthesia    Depression    Family history of diabetes mellitus    GERD (gastroesophageal reflux disease)    Gestational diabetes 1993   Headache    HSV (herpes simplex virus) infection    Hypertension    Hypothyroidism    IBS (irritable bowel syndrome)    Lab test positive for detection of COVID-19 virus    PONV (postoperative nausea and vomiting)    Sleep apnea    Thyroid  disease    hypothroidsim    Assessment: Patient Reported Symptoms:  Cognitive Cognitive Status: Alert and oriented to person, place, and time, Insightful and able to interpret abstract concepts, Normal speech and language skills      Neurological Neurological Review of Symptoms: Headaches Neurological Management Strategies: Adequate rest, Medication therapy Neurological Self-Management Outcome: 2 (bad) Neurological Comment: Continues to wait for appointment with Concussion clinic, this RNCM attempted [facility] Neurology at 4:33pm, office had closed at 4:30pm, will try again tomorrow to check on referral status.  HEENT HEENT Symptoms Reported: Not assessed      Cardiovascular Cardiovascular  Symptoms Reported: Not assessed    Respiratory Respiratory Symptoms Reported: Not assesed    Endocrine Endocrine Symptoms Reported: Not assessed    Gastrointestinal Gastrointestinal Symptoms Reported: Constipation, Diarrhea, Reflux/heartburn Gastrointestinal Comment: She has not heard back from [facility] Pelvic PT.  This RNCM called [phone], spoke to Alisa, states they are back logged until middle of December, she has patient's referral and will call patient to offer appt in December or she can get PCP to place a referral elsewhere.    Genitourinary Genitourinary Symptoms Reported: Not assessed    Integumentary Integumentary Symptoms Reported: Not assessed    Musculoskeletal Musculoskelatal Symptoms Reviewed: Not assessed        Psychosocial Psychosocial Symptoms Reported: Sadness - if selected complete PHQ 2-9 Additional Psychological Details: Reports depression has increased over the last week due to long-term disability income canceled, she has to submit an appeal to social security, having headaches and GERD, hasn't heard back from Concussion clinic and Pelvic PT for initial appointments - she feels overwhelmed. Behavioral Health Self-Management Outcome: 2 (bad)        04/28/2024    PHQ2-9 Depression Screening   Little interest or pleasure in doing things Nearly every day  Feeling down, depressed, or hopeless Nearly every day  PHQ-2 - Total Score 6  Trouble falling or staying asleep, or sleeping too much Nearly every day  Feeling tired or having little energy Nearly every day  Poor appetite or overeating  More than half the days  Feeling bad about yourself - or that you are a  failure or have let yourself or your family down Several days  Trouble concentrating on things, such as reading the newspaper or watching television Nearly every day  Moving or speaking so slowly that other people could have noticed.  Or the opposite - being so fidgety or restless that you have been moving  around a lot more than usual Nearly every day  Thoughts that you would be better off dead, or hurting yourself in some way Not at all  PHQ2-9 Total Score 21  If you checked off any problems, how difficult have these problems made it for you to do your work, take care of things at home, or get along with other people Very difficult  Depression Interventions/Treatment Medication    There were no vitals filed for this visit.  Medications Reviewed Today   Medications were not reviewed in this encounter     Recommendation:   Discussed upcoming appointments:  VBCI Pharmacist 05/11/24 VBCI LCSW 05/14/24 PCP 05/26/24 This RNCM will call [facility] Neurology on 9/17 during office hours to inquire of status of referral. Called and spoke with Alisa at [facility] regarding Pelvic PT, they have referral and will call patient to set up appointment.     Follow Up Plan:   Telephone follow-up 05/01/24 at 4:00pm.   Santana Stamp BSN, CCM [place]  [facility] RN Care Manager Direct Dial: [phone]  Fax: [phone]

## 2024-04-29 ENCOUNTER — Encounter: Payer: Self-pay | Admitting: Family Medicine

## 2024-04-30 ENCOUNTER — Telehealth: Payer: Self-pay

## 2024-04-30 NOTE — Patient Outreach (Signed)
 Contacted [facility], staff states they received the referral but are only taking patients referred from a [facility] provider due to saturated schedule.  They are currently scheduling out to July 2026 for all patients regardless of provider but the scheduling isn't open as of yet.

## 2024-04-30 NOTE — Patient Outreach (Signed)
 Contacted [facility], left message with staff informing of wait time for scheduling is out to July 2026 for [facility] Concussion clinic, clinic is not scheduling patients outside [facility] providers until July 2026, and the opportunity to schedule is not open as of yet.  Staff will pass message to Dr. Vicci for further plan.

## 2024-05-01 ENCOUNTER — Other Ambulatory Visit: Payer: Self-pay

## 2024-05-01 NOTE — Patient Outreach (Signed)
 Quick follow up with patient to inform [facility] Concussion clinic isn't taking patients until July 2026, and that I've informed [facility] office, requesting referral to another concussion rehab facility.

## 2024-05-05 DIAGNOSIS — F419 Anxiety disorder, unspecified: Secondary | ICD-10-CM | POA: Diagnosis not present

## 2024-05-11 ENCOUNTER — Other Ambulatory Visit: Payer: Self-pay

## 2024-05-11 DIAGNOSIS — F0781 Postconcussional syndrome: Secondary | ICD-10-CM

## 2024-05-12 NOTE — Progress Notes (Unsigned)
 05/12/2024 Name: Pamela Avila MRN: [id number] DOB: 1971-05-26  Chief Complaint  Patient presents with   Medication Management   Pamela Avila is a 53 y.o. year old female who presented for a telephone visit.   They were referred to the pharmacist by their PCP for assistance in managing complex medication management.   Subjective:  Care Team: Primary Care Provider: Vicci Duwaine SQUIBB, DO ; Next Scheduled Visit: 10/14  Medication Access/Adherence  Current Pharmacy:  [facility] - [place], [place] - [address] [place] [address] Phone: [phone] Fax: [phone]  [facility] - [place], [place] - [address] [place] [address] Phone: [phone] Fax: [phone]  -Patient reports affordability concerns with their medications: Yes  -Patient reports access/transportation concerns to their pharmacy: Yes  -Patient reports adherence concerns with their medications:  Yes    Medication Management: -Patient endorses fair adherence to current medication regimen but does forget doses on occasion  -Would like medications packaged and delivered, but current pharmacy either does not offer these services or they are too expensive  -Reviewed current medication list, and list on profile is up to date with what medications patient is currently taking and how, except for levothyroxine .  Patient has not received dose increase of 125mcg tablets and is currently taking 112mcg daily. -Patient states she was previously taking Emgality for migraine prevention, and she would prefer this over Qulipta, but insurance did not approve continued coverage of Emgality.  Patient was taking Emgality in 2024 and endorsed headache days per month decreased by 10.  Objective: Lab Results  Component Value Date   CREATININE 0.89 04/14/2024   BUN 12 04/14/2024   NA 138 04/14/2024   K 4.4 04/14/2024   CL 99 04/14/2024   CO2 21 04/14/2024    Medications Reviewed Today     Reviewed by Deanna Channing LABOR, RPH (Pharmacist) on 05/11/24 at 1606  Med List Status: <None>   Medication Order Taking? Sig Documenting Provider Last Dose Status Informant  amphetamine -dextroamphetamine  (ADDERALL XR) 20 MG 24 hr capsule [id number] Yes Take 40 mg by mouth daily. [provider]  Active   buPROPion  (WELLBUTRIN  SR) 150 MG 12 hr tablet [id number] Yes Take 1 pill in the AM for 2 weeks, then increase to 1 pill BID Johnson, Megan P, DO  Active   busPIRone  (BUSPAR ) 5 MG tablet [id number] Yes Take by mouth.  Patient taking differently: Take by mouth as needed.   [provider]  Active   co-enzyme Q-10 30 MG capsule [id number] Yes Take 30 mg by mouth daily.  Patient taking differently: Take 200 mg by mouth daily.   [provider]  Active   dicyclomine  (BENTYL ) 10 MG capsule [id number] Yes Take 1 capsule (10 mg total) by mouth 3 (three) times daily as needed for spasms. Johnson, Megan P, DO  Active   escitalopram  (LEXAPRO ) 20 MG tablet [id number] Yes Take 20 mg by mouth daily. [provider]  Active   levothyroxine  (SYNTHROID ) 125 MCG tablet [id number] Yes Take 1 tablet (125 mcg total) by mouth daily.  Patient taking differently: Take 112 mcg by mouth daily.   Johnson, Megan P, DO  Active   magnesium gluconate (MAGONATE) 500 MG tablet [id number] Yes Take 500 mg by mouth 1 day or 1 dose. [provider]  Active   meclizine  (ANTIVERT ) 25 MG tablet [id number] Yes Take 1 tablet (25  mg total) by mouth 3 (three) times daily as needed for dizziness. Johnson, Megan P, DO  Active   metoprolol  succinate (TOPROL -XL) 50 MG 24 hr tablet [id number] Yes Take 1 tablet (50 mg total) by mouth daily. GENERIC EQUIVALENT FOR TOPROL  XL Vicci Bouchard P, DO  Active   naratriptan (AMERGE) 2.5 MG tablet [id number] Yes Take 1 tab at onset of migraine. May repeat in 2 hours if needed. Max 2/day and 2-3 days/wk. [provider]  Active    Patient taking differently:   Discontinued 05/11/24 1603 (Dose change) Omega-3 Fatty Acids (FISH OIL) 1000 MG CAPS [id number] Yes Take by mouth. [provider]  Active   omeprazole  (PRILOSEC) 20 MG capsule [id number] Yes TAKE 1 CAPSULE TWICE A DAY AS NEEDED FOR HEARTBURN/INDIGESTION Johnson, Megan P, DO  Active   ondansetron  (ZOFRAN -ODT) 4 MG disintegrating tablet [id number] Yes Take 1 tablet (4 mg total) by mouth every 8 (eight) hours as needed. Vicci Bouchard SQUIBB, DO  Active     Discontinued 05/11/24 1544 (Completed Course)   QULIPTA 60 MG TABS [id number] Yes Take 1 tablet by mouth daily. [provider]  Active            Assessment/Plan:   Medication Management: -Currently strategy insufficient to maintain appropriate adherence to prescribed medication regimen -Discussed collaboration with local pharmacies for adherence packaging. Reviewed local pharmacies with adherence packaging options. Patient elects to transfer prescriptions from [place] to [facility] for adherence packaging and home delivery.  Address on file is correct for home delivery of medications.  Coordinating with [facility] to transfer medications and will work with Dr. Vicci to send new prescriptions if needed.  Patient only needs levothyroxine  125mcg filled at this time; all other prescriptions can go on hold until time to fill/package together.   -Informed patient that Adderall, as needed medications, and levothyoxine will not be included in pill packs; and she endorsed understanding -She would like prescriptions sent in for OTC medications, so these can be included in pill packs -Working on authorization for [employer] to replaced [place] based on patient preference (CoverMyMeds [employer])  Follow Up Plan: Will notify patient regarding coverage of Emgality  Channing DELENA Mealing, PharmD, DPLA

## 2024-05-13 ENCOUNTER — Other Ambulatory Visit (HOSPITAL_COMMUNITY): Payer: Self-pay

## 2024-05-13 MED ORDER — NORTRIPTYLINE HCL 25 MG PO CAPS
50.0000 mg | ORAL_CAPSULE | Freq: Every day | ORAL | 1 refills | Status: AC
  Filled 2024-05-13 – 2024-07-08 (×3): qty 60, 30d supply, fill #0

## 2024-05-13 MED ORDER — FISH OIL 1000 MG PO CAPS
1.0000 | ORAL_CAPSULE | Freq: Every day | ORAL | 1 refills | Status: AC
  Filled 2024-05-13: qty 90, 90d supply, fill #0
  Filled 2024-07-27: qty 30, 30d supply, fill #0
  Filled 2024-08-26 – 2024-08-31 (×2): qty 30, 30d supply, fill #1

## 2024-05-13 MED ORDER — MAGNESIUM OXIDE -MG SUPPLEMENT 250 MG PO TABS
500.0000 mg | ORAL_TABLET | Freq: Every day | ORAL | 1 refills | Status: AC
  Filled 2024-05-13: qty 200, 100d supply, fill #0
  Filled 2024-07-07: qty 60, 30d supply, fill #0
  Filled 2024-08-26 – 2024-08-31 (×2): qty 60, 30d supply, fill #1
  Filled ????-??-??: fill #0

## 2024-05-13 MED ORDER — COENZYME Q-10 200 MG PO CAPS
1.0000 | ORAL_CAPSULE | Freq: Every morning | ORAL | 1 refills | Status: AC
  Filled 2024-05-13: qty 100, fill #0
  Filled 2024-07-27: qty 30, 30d supply, fill #0
  Filled 2024-08-26 – 2024-08-31 (×2): qty 30, 30d supply, fill #1

## 2024-05-13 MED ORDER — OMEPRAZOLE 20 MG PO CPDR
20.0000 mg | DELAYED_RELEASE_CAPSULE | Freq: Two times a day (BID) | ORAL | 1 refills | Status: DC
  Filled 2024-05-13: qty 60, 30d supply, fill #0

## 2024-05-13 NOTE — Telephone Encounter (Signed)
 Copied from CRM [id number]. Topic: Referral - Status >> Apr 30, 2024  9:01 AM [place] MATSU wrote: Reason for CRM:  Nurse Case manager called in to advise that she Contacted [facility], staff states they received the referral but are only taking patients referred from a [facility] provider due to saturated schedule.  They are currently scheduling out to July 2026 for all patients regardless of provider but the scheduling isn't open as of yet.    Per Santana she would like to know how we would go about this for patient.  Per Santana states that  patient will need to be re-referred to a different provider.   May you please assist.   It's unfortunate, but several [facility] offices and some [facility] offices are starting to only see patients within their hub so with this patient a new referral has to be placed and sent to a different facility or she can go to a [facility] urgent care and maybe they can send her to the requested facility.

## 2024-05-14 ENCOUNTER — Telehealth: Payer: Self-pay

## 2024-05-14 ENCOUNTER — Telehealth: Payer: Self-pay | Admitting: *Deleted

## 2024-05-14 NOTE — Progress Notes (Signed)
   05/14/2024  Patient ID: Pamela Avila, female   DOB: [birth year], 53 y.o.   MRN: [id number]  Contacted [employer] to follow-up on PA request submitted for Emgality 120mg /ml.  Clinical questions were faxed 10/1, but I do not see documentation where this was received.  Transferred to PA team to address clinical questions; these have been answered, and the office will receive a fax with the insurance determination.  Channing DELENA Mealing, PharmD, DPLA

## 2024-05-15 ENCOUNTER — Telehealth: Payer: Self-pay

## 2024-05-15 ENCOUNTER — Telehealth (HOSPITAL_COMMUNITY): Payer: Self-pay

## 2024-05-15 ENCOUNTER — Other Ambulatory Visit (HOSPITAL_COMMUNITY): Payer: Self-pay

## 2024-05-15 ENCOUNTER — Other Ambulatory Visit: Payer: Self-pay | Admitting: *Deleted

## 2024-05-15 MED ORDER — EMGALITY 120 MG/ML ~~LOC~~ SOAJ
120.0000 mg | SUBCUTANEOUS | 11 refills | Status: AC
  Filled 2024-05-15: qty 1, 30d supply, fill #0
  Filled 2024-05-15: qty 2, 28d supply, fill #0
  Filled 2024-05-18: qty 1, 30d supply, fill #0
  Filled 2024-06-13: qty 1, 30d supply, fill #1
  Filled 2024-07-06 – 2024-07-27 (×2): qty 1, 30d supply, fill #2
  Filled 2024-08-21 – 2024-08-22 (×2): qty 1, 30d supply, fill #3

## 2024-05-15 NOTE — Telephone Encounter (Signed)
 PA request has been Approved. New Encounter has been or will be created for follow up. For additional info see Pharmacy Prior Auth telephone encounter from 05/15/24.

## 2024-05-15 NOTE — Progress Notes (Signed)
   05/15/2024  Patient ID: Pamela Avila, female   DOB: [birth year], 53 y.o.   MRN: [id number]  Contacted patient to make her aware that Emgality 120mg  has been approved for insurance and is going through for $0 copay.  Medication appears to be ready at [facility], so I am contacted the pharmacy to see if they can go ahead and send this and levothyroxine  out as soon as the transfer is received from her previous pharmacy.  Patient will stop Qulipta and resume Emgality.  Sending my direct phone number in MyChart message, so she can reach out for any future medication needs.  Channing DELENA Mealing, PharmD, DPLA

## 2024-05-15 NOTE — Patient Outreach (Signed)
 Complex Care Management   Visit Note  05/15/2024  Name:  Pamela Avila MRN: [id number] DOB: 1970-11-14  Situation: Referral received for Complex Care Management related to post concussion syndrome I obtained verbal consent from Patient.  Visit completed with Patient  on the phone .   Background:   Past Medical History:  Diagnosis Date   ADHD (attention deficit hyperactivity disorder)    Anemia    Anxiety    Anxiety disorder    Complication of anesthesia    Depression    Family history of diabetes mellitus    GERD (gastroesophageal reflux disease)    Gestational diabetes 1993   Headache    HSV (herpes simplex virus) infection    Hypertension    Hypothyroidism    IBS (irritable bowel syndrome)    Lab test positive for detection of COVID-19 virus    PONV (postoperative nausea and vomiting)    Sleep apnea    Thyroid  disease    hypothroidsim    Assessment: Patient Reported Symptoms:  Cognitive Cognitive Status: Alert and oriented to person, place, and time, Insightful and able to interpret abstract concepts, Normal speech and language skills Cognitive/Intellectual Conditions Management [RPT]: Other Other: post cponcussion syndrome   Health Maintenance Behaviors: Annual physical exam Healing Pattern: Slow Health Facilitated by: Stress management  Neurological Neurological Review of Symptoms: Headaches Neurological Management Strategies: Adequate rest, Medication therapy Neurological Self-Management Outcome: 2 (bad) Neurological Comment: provided contact tp the [facility] [place] for additional support and resources  HEENT HEENT Symptoms Reported: Change or loss of hearing (experiencing double vision following the brain injury) HEENT Management Strategies: Routine screening HEENT Self-Management Outcome: 4 (good)    Cardiovascular Cardiovascular Symptoms Reported: No symptoms reported    Respiratory Respiratory Symptoms Reported: Other: Other Respiratory  Symptoms: some mild congestion    Endocrine Endocrine Symptoms Reported: No symptoms reported    Gastrointestinal Gastrointestinal Symptoms Reported: Constipation Additional Gastrointestinal Details: constipation cycle right now-added fiber to diet      Genitourinary Genitourinary Symptoms Reported: No symptoms reported    Integumentary Integumentary Symptoms Reported: No symptoms reported    Musculoskeletal Musculoskelatal Symptoms Reviewed: Unsteady gait Other Musculoskeletal Symptoms: looses balance alot-has a cain, not used alot but use encouraged        Psychosocial     Behaviors When Feeling Stressed/Fearful: tries to no focus on it, keeps notes if small acciedent support groups Techniques to Cope with Loss/Stress/Change: Diversional activities Quality of Family Relationships: supportive, involved, stressful    05/15/2024    PHQ2-9 Depression Screening   Little interest or pleasure in doing things    Feeling down, depressed, or hopeless    PHQ-2 - Total Score    Trouble falling or staying asleep, or sleeping too much    Feeling tired or having little energy    Poor appetite or overeating     Feeling bad about yourself - or that you are a failure or have let yourself or your family down    Trouble concentrating on things, such as reading the newspaper or watching television    Moving or speaking so slowly that other people could have noticed.  Or the opposite - being so fidgety or restless that you have been moving around a lot more than usual    Thoughts that you would be better off dead, or hurting yourself in some way    PHQ2-9 Total Score    If you checked off any problems, how difficult have these problems  made it for you to do your work, take care of things at home, or get along with other people    Depression Interventions/Treatment      There were no vitals filed for this visit.  Medications Reviewed Today     Reviewed by Ermalinda Lenn HERO, LCSW (Social Worker)  on 05/15/24 at 2011  Med List Status: <None>   Medication Order Taking? Sig Documenting Provider Last Dose Status Informant  amphetamine -dextroamphetamine  (ADDERALL XR) 20 MG 24 hr capsule [id number] Yes Take 40 mg by mouth daily. [provider]  Active   buPROPion  (WELLBUTRIN  SR) 150 MG 12 hr tablet [id number] Yes Take 1 pill in the AM for 2 weeks, then increase to 1 pill BID Johnson, Megan P, DO  Active   busPIRone  (BUSPAR ) 5 MG tablet [id number] Yes Take by mouth.  Patient taking differently: Take by mouth as needed.   [provider]  Active   Coenzyme Q10 (CO Q-10) 200 MG CAPS [id number] Yes Take 1 capsule by mouth in the morning. Johnson, Megan P, DO  Active   dicyclomine  (BENTYL ) 10 MG capsule [id number] Yes Take 1 capsule (10 mg total) by mouth 3 (three) times daily as needed for spasms. Johnson, Megan P, DO  Active   escitalopram  (LEXAPRO ) 20 MG tablet [id number] Yes Take 20 mg by mouth daily. [provider]  Active   Galcanezumab-gnlm [facility]) 120 MG/ML EMMANUEL [id number] Yes Inject 120 mg into the skin every 30 (thirty) days. Vicci Bouchard P, DO  Active   levothyroxine  (SYNTHROID ) 125 MCG tablet [id number] Yes Take 1 tablet (125 mcg total) by mouth daily.  Patient taking differently: Take 112 mcg by mouth daily.   Vicci Bouchard P, DO  Active   Magnesium Oxide 250 MG TABS [id number] Yes Take 2 tablets (500 mg total) by mouth at bedtime. Johnson, Megan P, DO  Active   meclizine  (ANTIVERT ) 25 MG tablet [id number] Yes Take 1 tablet (25 mg total) by mouth 3 (three) times daily as needed for dizziness. Johnson, Megan P, DO  Active   metoprolol  succinate (TOPROL -XL) 50 MG 24 hr tablet [id number] Yes Take 1 tablet (50 mg total) by mouth daily. GENERIC EQUIVALENT FOR TOPROL  XL Johnson, Megan P, DO  Active   naratriptan (AMERGE) 2.5 MG tablet [id number] Yes Take 1 tab at onset of migraine. May repeat in 2 hours if needed. Max 2/day and 2-3 days/wk. [provider]   Active   nortriptyline (PAMELOR) 25 MG capsule [id number] Yes Take 2 capsules (50 mg total) by mouth at bedtime. Johnson, Megan P, DO  Active   Omega-3 Fatty Acids (FISH OIL) 1000 MG CAPS [id number] Yes Take 1 capsule (1,000 mg total) by mouth daily. Vicci, Megan P, DO  Active   omeprazole  (PRILOSEC) 20 MG capsule [id number] Yes Take 1 capsule (20 mg total) by mouth 2 (two) times daily before a meal. Vicci, Megan P, DO  Active   ondansetron  (ZOFRAN -ODT) 4 MG disintegrating tablet [id number] Yes Take 1 tablet (4 mg total) by mouth every 8 (eight) hours as needed. Vicci Bouchard SQUIBB, DO  Active             Recommendation:   PCP Follow-up Specialty provider follow-up as scheduled [facility] [place]  [phone]  Follow Up Plan:   Telephone follow up appointment date/time:  06/04/24   Lenn Ermalinda, LCSW [place]  [facility], [facility] Licensed Clinical Social Worker  Direct Dial: [phone]

## 2024-05-15 NOTE — Patient Instructions (Signed)
 Visit Information  Thank you for taking time to visit with me today. Please don't hesitate to contact me if I can be of assistance to you before our next scheduled appointment.  Your next care management appointment is by telephone on 06/04/24 at 2:30pm    Please call the care guide team at [phone] if you need to cancel, schedule, or reschedule an appointment.   Please call the Suicide and Crisis Lifeline: 988 call the [place]  National Suicide Prevention Lifeline: 424-352-6750 or TTY: (424) 350-0479 TTY 2053075838) to talk to a trained counselor call 1-800-273-TALK (toll free, 24 hour hotline) call 911 if you are experiencing a Mental Health or Behavioral Health Crisis or need someone to talk to.   Chimene Salo, LCSW [place]  [facility], [facility] Licensed Clinical Social Worker  Direct Dial: [phone]

## 2024-05-15 NOTE — Progress Notes (Signed)
   05/15/2024  Patient ID: Pamela Avila, female   DOB: 11/01/1970, 53 y.o.   MRN: [id number]  PA for Emgality 120mg  every 30 days has been approved by insurance, and test claim reflects $0 copay.  Patient prefers this medication over [name], so she will stop [name] and resume Emgality.  Order pending for PCP to sign if in agreement, and I will follow-up with patient this afternoon.  Channing DELENA Mealing, PharmD, DPLA

## 2024-05-18 ENCOUNTER — Other Ambulatory Visit: Payer: Self-pay

## 2024-05-18 ENCOUNTER — Encounter: Payer: Self-pay | Admitting: Pharmacist

## 2024-05-18 ENCOUNTER — Other Ambulatory Visit (HOSPITAL_COMMUNITY): Payer: Self-pay

## 2024-05-19 ENCOUNTER — Other Ambulatory Visit: Payer: Self-pay

## 2024-05-19 ENCOUNTER — Other Ambulatory Visit (HOSPITAL_COMMUNITY): Payer: Self-pay

## 2024-05-19 MED ORDER — OMEPRAZOLE 20 MG PO CPDR
20.0000 mg | DELAYED_RELEASE_CAPSULE | Freq: Two times a day (BID) | ORAL | 1 refills | Status: AC | PRN
  Filled 2024-07-06: qty 60, 30d supply, fill #0
  Filled 2024-08-21 – 2024-08-26 (×2): qty 60, 30d supply, fill #1
  Filled ????-??-??: fill #0

## 2024-05-19 MED ORDER — QULIPTA 60 MG PO TABS
60.0000 mg | ORAL_TABLET | Freq: Every day | ORAL | 1 refills | Status: DC
  Filled 2024-07-07 – 2024-07-27 (×2): qty 30, 30d supply, fill #0

## 2024-05-19 MED ORDER — NORTRIPTYLINE HCL 25 MG PO CAPS: 25.0000 mg | ORAL_CAPSULE | Freq: Every evening | ORAL | 1 refills | Status: DC | PRN

## 2024-05-19 MED ORDER — NARATRIPTAN HCL 2.5 MG PO TABS
2.5000 mg | ORAL_TABLET | Freq: Every day | ORAL | 1 refills | Status: AC | PRN
  Filled 2024-07-30: qty 9, 30d supply, fill #0

## 2024-05-19 MED ORDER — ESCITALOPRAM OXALATE 20 MG PO TABS
20.0000 mg | ORAL_TABLET | Freq: Every day | ORAL | 1 refills | Status: DC
  Filled 2024-05-19 – 2024-05-28 (×4): qty 30, 30d supply, fill #0

## 2024-05-19 MED ORDER — BUPROPION HCL ER (SR) 150 MG PO TB12
150.0000 mg | ORAL_TABLET | Freq: Two times a day (BID) | ORAL | 2 refills | Status: DC
  Filled 2024-05-19: qty 60, 30d supply, fill #0

## 2024-05-19 MED ORDER — BUPROPION HCL ER (SR) 150 MG PO TB12
ORAL_TABLET | ORAL | 2 refills | Status: DC
  Filled 2024-05-26: qty 60, 30d supply, fill #0
  Filled ????-??-??: fill #0

## 2024-05-19 MED ORDER — AMANTADINE HCL 100 MG PO TABS
ORAL_TABLET | ORAL | 0 refills | Status: AC
  Filled 2024-07-07: qty 15, 30d supply, fill #0
  Filled 2024-07-27: qty 15, 15d supply, fill #0

## 2024-05-19 MED ORDER — LEVOTHYROXINE SODIUM 100 MCG PO TABS
100.0000 ug | ORAL_TABLET | Freq: Every day | ORAL | 1 refills | Status: DC
  Filled ????-??-??: fill #0

## 2024-05-19 NOTE — Addendum Note (Signed)
 Addended by: DEANNA ROSELLA A on: 05/19/2024 08:44 AM   Modules accepted: Orders

## 2024-05-19 NOTE — Addendum Note (Signed)
 Addended by: DEANNA ROSELLA A on: 05/19/2024 08:48 AM   Modules accepted: Orders

## 2024-05-20 ENCOUNTER — Other Ambulatory Visit (HOSPITAL_COMMUNITY): Payer: Self-pay

## 2024-05-21 ENCOUNTER — Telehealth: Payer: Self-pay

## 2024-05-22 ENCOUNTER — Telehealth

## 2024-05-25 ENCOUNTER — Other Ambulatory Visit: Payer: Self-pay

## 2024-05-26 ENCOUNTER — Other Ambulatory Visit (HOSPITAL_COMMUNITY): Payer: Self-pay

## 2024-05-26 ENCOUNTER — Ambulatory Visit (INDEPENDENT_AMBULATORY_CARE_PROVIDER_SITE_OTHER): Admitting: Family Medicine

## 2024-05-26 ENCOUNTER — Other Ambulatory Visit: Payer: Self-pay

## 2024-05-26 ENCOUNTER — Encounter: Payer: Self-pay | Admitting: Family Medicine

## 2024-05-26 VITALS — BP 125/84 | HR 87 | Temp 98.0°F | Ht 61.0 in | Wt 166.4 lb

## 2024-05-26 DIAGNOSIS — F419 Anxiety disorder, unspecified: Secondary | ICD-10-CM

## 2024-05-26 DIAGNOSIS — E038 Other specified hypothyroidism: Secondary | ICD-10-CM

## 2024-05-26 DIAGNOSIS — F32A Depression, unspecified: Secondary | ICD-10-CM

## 2024-05-26 DIAGNOSIS — G43E09 Chronic migraine with aura, not intractable, without status migrainosus: Secondary | ICD-10-CM | POA: Diagnosis not present

## 2024-05-26 DIAGNOSIS — F0781 Postconcussional syndrome: Secondary | ICD-10-CM | POA: Diagnosis not present

## 2024-05-26 MED ORDER — BUPROPION HCL ER (XL) 150 MG PO TB24
150.0000 mg | ORAL_TABLET | Freq: Every day | ORAL | 1 refills | Status: AC
  Filled 2024-05-26 – 2024-05-28 (×2): qty 30, 30d supply, fill #0
  Filled 2024-07-06 – 2024-07-27 (×2): qty 30, 30d supply, fill #1
  Filled 2024-08-26 – 2024-08-31 (×2): qty 30, 30d supply, fill #2

## 2024-05-26 MED ORDER — NURTEC 75 MG PO TBDP
75.0000 mg | ORAL_TABLET | Freq: Every day | ORAL | 12 refills | Status: AC | PRN
  Filled 2024-05-26 – 2024-06-05 (×2): qty 9, 30d supply, fill #0
  Filled 2024-07-06: qty 9, 30d supply, fill #1
  Filled 2024-07-27: qty 9, 30d supply, fill #2
  Filled 2024-07-30: qty 8, 30d supply, fill #2
  Filled 2024-08-21: qty 8, 30d supply, fill #3

## 2024-05-26 NOTE — Assessment & Plan Note (Signed)
 Lab closed today so unable to get drawn. Will come back next week for mammo and get labs drawn. Continue 112mcg until then.

## 2024-05-26 NOTE — Progress Notes (Signed)
 BP 125/84   Pulse 87   Temp 98 F (36.7 C) (Oral)   Ht 5' 1 (1.549 m)   Wt 166 lb 6.4 oz (75.5 kg)   SpO2 96%   BMI 31.44 kg/m    Subjective:    Patient ID: Pamela Avila, female    DOB: 04-16-1971, 53 y.o.   MRN: [id number]  HPI: Pamela Avila is a 53 y.o. female  Chief Complaint  Patient presents with   Anxiety    Wellbutrin  going well. Would like extended release.      Depression   Back on the emgality and doing better. But migraines get really bad at the end of the month again. Usually having migraines several times a week for several days at that time.   ANXIETY/DURATION Duration: Chronic Status:slightly better Anxious mood: yes  Excessive worrying: yes Irritability: no  Sweating: no Nausea: no Palpitations:no Hyperventilation: no Panic attacks: no Agoraphobia: no  Obscessions/compulsions: no Depressed mood: yes    05/26/2024    4:38 PM 04/28/2024    4:19 PM 04/22/2024    4:34 PM 04/14/2024    2:02 PM 03/02/2024    3:22 PM  Depression screen PHQ 2/9  Decreased Interest 2 3 0 0 1  Down, Depressed, Hopeless 2 3 0 2 1  PHQ - 2 Score 4 6 0 2 2  Altered sleeping 3 3  1 3   Tired, decreased energy 2 3  3 3   Change in appetite 1 2  1  0  Feeling bad or failure about yourself  1 1  0 0  Trouble concentrating 3 3  3 3   Moving slowly or fidgety/restless 2 3  2 3   Suicidal thoughts 1 0  0 0  PHQ-9 Score 17 21  12 14   Difficult doing work/chores Very difficult Very difficult  Very difficult Extremely dIfficult   Anhedonia: no Weight changes: no Insomnia: yes   Hypersomnia: no Fatigue/loss of energy: yes Feelings of worthlessness: no Feelings of guilt: no Impaired concentration/indecisiveness: no Suicidal ideations: no  Crying spells: yes Recent Stressors/Life Changes: no   Relationship problems: no   Family stress: no     Financial stress: no    Job stress: no    Recent death/loss: no  HYPOTHYROIDISM- has not started her 1250mg - has stayed on the  Thyroid  control status:stable Satisfied with current treatment? yes Medication side effects: no Medication compliance: excellent compliance Recent dose adjustment:no Fatigue: no Cold intolerance: no Heat intolerance: no Weight gain: no Weight loss: no Constipation: no Diarrhea/loose stools: no Palpitations: no Lower extremity edema: no Anxiety/depressed mood: no  Relevant past medical, surgical, family and social history reviewed and updated as indicated. Interim medical history since our last visit reviewed. Allergies and medications reviewed and updated.  Review of Systems  Constitutional: Negative.   Respiratory: Negative.    Cardiovascular: Negative.   Musculoskeletal: Negative.   Neurological:  Positive for headaches. Negative for dizziness, tremors, seizures, syncope, facial asymmetry, speech difficulty, weakness, light-headedness and numbness.  Psychiatric/Behavioral: Negative.      Per HPI unless specifically indicated above     Objective:    BP 125/84   Pulse 87   Temp 98 F (36.7 C) (Oral)   Ht 5' 1 (1.549 m)   Wt 166 lb 6.4 oz (75.5 kg)   SpO2 96%   BMI 31.44 kg/m   Wt Readings from Last 3 Encounters:  05/26/24 166 lb 6.4 oz (75.5 kg)  04/14/24 166 lb (75.3 kg)  03/02/24 176 lb 7.5 oz (80 kg)    Physical Exam Vitals and nursing note reviewed.  Constitutional:      General: She is not in acute distress.    Appearance: Normal appearance. She is not ill-appearing, toxic-appearing or diaphoretic.  HENT:     Head: Normocephalic and atraumatic.     Right Ear: External ear normal.     Left Ear: External ear normal.     Nose: Nose normal.     Mouth/Throat:     Mouth: Mucous membranes are moist.     Pharynx: Oropharynx is clear.  Eyes:     General: No scleral icterus.       Right eye: No discharge.        Left eye: No discharge.     Extraocular Movements: Extraocular movements intact.     Conjunctiva/sclera: Conjunctivae normal.     Pupils:  Pupils are equal, round, and reactive to light.  Cardiovascular:     Rate and Rhythm: Normal rate and regular rhythm.     Pulses: Normal pulses.     Heart sounds: Normal heart sounds. No murmur heard.    No friction rub. No gallop.  Pulmonary:     Effort: Pulmonary effort is normal. No respiratory distress.     Breath sounds: Normal breath sounds. No stridor. No wheezing, rhonchi or rales.  Chest:     Chest wall: No tenderness.  Musculoskeletal:        General: Normal range of motion.     Cervical back: Normal range of motion and neck supple.  Skin:    General: Skin is warm and dry.     Capillary Refill: Capillary refill takes less than 2 seconds.     Coloration: Skin is not jaundiced or pale.     Findings: No bruising, erythema, lesion or rash.  Neurological:     General: No focal deficit present.     Mental Status: She is alert and oriented to person, place, and time. Mental status is at baseline.  Psychiatric:        Mood and Affect: Mood normal.        Behavior: Behavior normal.        Thought Content: Thought content normal.        Judgment: Judgment normal.     Results for orders placed or performed in visit on 04/14/24  Basic metabolic panel with GFR   Collection Time: 04/14/24  2:53 PM  Result Value Ref Range   Glucose 92 70 - 99 mg/dL   BUN 12 6 - 24 mg/dL   Creatinine, Ser 9.10 0.57 - 1.00 mg/dL   eGFR 78 >40 fO/fpw/8.26   BUN/Creatinine Ratio 13 9 - 23   Sodium 138 134 - 144 mmol/L   Potassium 4.4 3.5 - 5.2 mmol/L   Chloride 99 96 - 106 mmol/L   CO2 21 20 - 29 mmol/L   Calcium 10.0 8.7 - 10.2 mg/dL  TSH   Collection Time: 04/14/24  2:53 PM  Result Value Ref Range   TSH 4.480 0.450 - 4.500 uIU/mL      Assessment & Plan:   Problem List Items Addressed This Visit       Cardiovascular and Mediastinum   Chronic migraine with aura without status migrainosus, not intractable   Doing well on her emgality, but it's wearing off after about 2 weeks. Will add  nurtec for PRN use. Follow up 6-8 weeks.       Relevant Medications  buPROPion  (WELLBUTRIN  XL) 150 MG 24 hr tablet   Rimegepant Sulfate (NURTEC) 75 MG TBDP     Endocrine   Hypothyroidism   Lab closed today so unable to get drawn. Will come back next week for mammo and get labs drawn. Continue 112mcg until then.       Relevant Orders   TSH     Nervous and Auditory   Postconcussion syndrome - Primary   We are unable to refer to concussion clinic at [facility]. Advised her to go see [facility] urgent care and see if they can refer her there.         Other   Anxiety and depression   Doing better on the wellbutrin . Will change over to extended release 150 and recheck in 6-8 weeks. Call with any concerns.       Relevant Medications   buPROPion  (WELLBUTRIN  XL) 150 MG 24 hr tablet     Follow up plan: Return 6-8 weeks.

## 2024-05-26 NOTE — Assessment & Plan Note (Signed)
 Doing well on her emgality, but it's wearing off after about 2 weeks. Will add nurtec for PRN use. Follow up 6-8 weeks.

## 2024-05-26 NOTE — Assessment & Plan Note (Signed)
 Doing better on the wellbutrin . Will change over to extended release 150 and recheck in 6-8 weeks. Call with any concerns.

## 2024-05-26 NOTE — Assessment & Plan Note (Signed)
 We are unable to refer to concussion clinic at [facility]. Advised her to go see [facility] urgent care and see if they can refer her there.

## 2024-05-26 NOTE — Patient Outreach (Signed)
 Complex Care Management   Visit Note  05/26/2024  Name:  Pamela Avila MRN: [id number] DOB: 08/15/1970  Situation: Referral received for Complex Care Management related to Falls, anxiety/depression, headaches. I obtained verbal consent from Patient.  Visit completed with Ms. Shoe  on the phone. On this contact, we focused mainly on counting medications so she can get started on pre-packaged medications.   Background:   Past Medical History:  Diagnosis Date   ADHD (attention deficit hyperactivity disorder)    Anemia    Anxiety    Anxiety disorder    Complication of anesthesia    Depression    Family history of diabetes mellitus    GERD (gastroesophageal reflux disease)    Gestational diabetes 1993   Headache    HSV (herpes simplex virus) infection    Hypertension    Hypothyroidism    IBS (irritable bowel syndrome)    Lab test positive for detection of COVID-19 virus    PONV (postoperative nausea and vomiting)    Sleep apnea    Thyroid  disease    hypothroidsim    Assessment: Patient Reported Symptoms:  Cognitive Cognitive Status: No symptoms reported, Alert and oriented to person, place, and time, Insightful and able to interpret abstract concepts      Neurological Neurological Review of Symptoms: Headaches Neurological Management Strategies: Medication therapy Neurological Self-Management Outcome: 2 (bad)  HEENT HEENT Symptoms Reported: Not assessed      Cardiovascular Cardiovascular Symptoms Reported: Not assessed    Respiratory Respiratory Symptoms Reported: Not assesed    Endocrine Endocrine Symptoms Reported: Headaches, Weakness or fatigue Is patient diabetic?: No Endocrine Comment: Patient was not able to pick up levothryoxine 125mcg, she will continue to take what she has, , until med can be refilled, states it wasn't in her bag so she believes she waited too long to pick it up and it was re-shelved.  Gastrointestinal Gastrointestinal Symptoms Reported:  Not assessed      Genitourinary Genitourinary Symptoms Reported: Not assessed    Integumentary Integumentary Symptoms Reported: Not assessed    Musculoskeletal Musculoskelatal Symptoms Reviewed: Not assessed        Psychosocial Psychosocial Symptoms Reported: Not assessed          05/26/2024    PHQ2-9 Depression Screening   Little interest or pleasure in doing things    Feeling down, depressed, or hopeless    PHQ-2 - Total Score    Trouble falling or staying asleep, or sleeping too much    Feeling tired or having little energy    Poor appetite or overeating     Feeling bad about yourself - or that you are a failure or have let yourself or your family down    Trouble concentrating on things, such as reading the newspaper or watching television    Moving or speaking so slowly that other people could have noticed.  Or the opposite - being so fidgety or restless that you have been moving around a lot more than usual    Thoughts that you would be better off dead, or hurting yourself in some way    PHQ2-9 Total Score    If you checked off any problems, how difficult have these problems made it for you to do your work, take care of things at home, or get along with other people    Depression Interventions/Treatment      There were no vitals filed for this visit.  Medications Reviewed Today   Medications were not reviewed  in this encounter     Recommendation:   This RNCM will send message to [facility] that is working with patient to provide medication count for meds that will be pre-packaged and to request refill resent to pharmacy for Levothryoxine 125mcg.  Reminded patient of PCP appointment scheduled for 05/26/24: [employer]LCSW on 06/04/24; Pelvic Rehab at [facility] starts 07/16/24   Follow Up Plan:   Telephone follow-up in 1 week  Santana Stamp BSN, CCM [place]  [facility] Population [facility] RN Care Manager Direct Dial: [phone]  Fax: [phone]

## 2024-05-26 NOTE — Patient Instructions (Signed)
 Visit Information  Thank you for taking time to visit with me today. Please don't hesitate to contact me if I can be of assistance to you before our next scheduled appointment.  Your next care management appointment is by telephone on Thursday, October 30th  at 3:45pm.    Please call the care guide team at [phone] if you need to cancel, schedule, or reschedule an appointment.   A reminder to ALL patients/family/friends, please call the [place]  National Suicide Prevention Lifeline: 808-829-1620 or TTY: (626) 612-3755 TTY 878-103-6424) to talk to a trained counselor if you are experiencing a Mental Health or Behavioral Health Crisis or need someone to talk to.  Santana Stamp BSN, CCM [place]  [facility] Population Health RN Care Manager Direct Dial: [phone]  Fax: [phone]

## 2024-05-27 ENCOUNTER — Other Ambulatory Visit (HOSPITAL_COMMUNITY): Payer: Self-pay

## 2024-05-27 ENCOUNTER — Encounter (HOSPITAL_COMMUNITY): Payer: Self-pay

## 2024-05-27 ENCOUNTER — Telehealth (HOSPITAL_COMMUNITY): Payer: Self-pay | Admitting: Pharmacy Technician

## 2024-05-27 ENCOUNTER — Telehealth: Payer: Self-pay

## 2024-05-27 ENCOUNTER — Other Ambulatory Visit: Payer: Self-pay

## 2024-05-27 NOTE — Telephone Encounter (Signed)
 PA request has been Received. New Encounter has been or will be created for follow up. For additional info see Pharmacy Prior Auth telephone encounter from 05/27/24.

## 2024-05-27 NOTE — Telephone Encounter (Signed)
 Pharmacy Patient Advocate Encounter   Received notification from Pt Calls Messages that prior authorization for Nurtec 75MG  dispersible tablets  is required/requested.   Insurance verification completed.   The patient is insured through [facility].   Per test claim: PA required; PA submitted to above mentioned insurance via Latent Key/confirmation #/EOC [id number] Status is pending

## 2024-05-28 ENCOUNTER — Other Ambulatory Visit: Payer: Self-pay

## 2024-05-28 ENCOUNTER — Other Ambulatory Visit (HOSPITAL_COMMUNITY): Payer: Self-pay

## 2024-05-29 ENCOUNTER — Other Ambulatory Visit (HOSPITAL_COMMUNITY): Payer: Self-pay

## 2024-06-01 ENCOUNTER — Other Ambulatory Visit: Payer: Self-pay | Admitting: Family Medicine

## 2024-06-01 ENCOUNTER — Other Ambulatory Visit (HOSPITAL_COMMUNITY): Payer: Self-pay

## 2024-06-01 DIAGNOSIS — Z1231 Encounter for screening mammogram for malignant neoplasm of breast: Secondary | ICD-10-CM

## 2024-06-03 ENCOUNTER — Other Ambulatory Visit (HOSPITAL_COMMUNITY): Payer: Self-pay

## 2024-06-04 ENCOUNTER — Telehealth: Admitting: *Deleted

## 2024-06-04 ENCOUNTER — Other Ambulatory Visit: Payer: Self-pay

## 2024-06-04 ENCOUNTER — Other Ambulatory Visit (HOSPITAL_COMMUNITY): Payer: Self-pay

## 2024-06-04 MED ORDER — ESCITALOPRAM OXALATE 20 MG PO TABS
20.0000 mg | ORAL_TABLET | Freq: Every day | ORAL | 1 refills | Status: AC
  Filled 2024-06-04 – 2024-07-06 (×2): qty 30, 30d supply, fill #0
  Filled 2024-08-26 – 2024-08-31 (×2): qty 30, 30d supply, fill #1
  Filled ????-??-??: fill #0

## 2024-06-04 NOTE — Telephone Encounter (Signed)
 Additional information has been requested from the patient's insurance in order to proceed with the prior authorization request. Requested information has been sent, or form has been filled out and faxed back to [phone]  Case Key Number: [id number]  Phone# [phone]

## 2024-06-05 ENCOUNTER — Other Ambulatory Visit

## 2024-06-05 ENCOUNTER — Other Ambulatory Visit (HOSPITAL_COMMUNITY): Payer: Self-pay

## 2024-06-05 ENCOUNTER — Other Ambulatory Visit: Payer: Self-pay

## 2024-06-05 ENCOUNTER — Ambulatory Visit
Admission: RE | Admit: 2024-06-05 | Discharge: 2024-06-05 | Disposition: A | Discharge status: Home / Self Care | Source: Ambulatory Visit | Attending: Family Medicine | Admitting: Family Medicine

## 2024-06-05 ENCOUNTER — Encounter

## 2024-06-05 DIAGNOSIS — E038 Other specified hypothyroidism: Secondary | ICD-10-CM | POA: Diagnosis not present

## 2024-06-05 DIAGNOSIS — Z1231 Encounter for screening mammogram for malignant neoplasm of breast: Secondary | ICD-10-CM

## 2024-06-05 NOTE — Telephone Encounter (Signed)
 Pharmacy Patient Advocate Encounter  Received notification from [facility] that Prior Authorization for Nurtec 75MG  dispersible tablets  has been APPROVED from 05/27/24 to 05/27/25. Ran test claim, Copay is $0. This test claim was processed through [facility]- copay amounts may vary at other pharmacies due to pharmacy/plan contracts, or as the patient moves through the different stages of their insurance plan.   PA #/Case ID/Reference #: [id number]

## 2024-06-06 LAB — TSH: TSH: 0.855 u[IU]/mL (ref 0.450–4.500)

## 2024-06-07 ENCOUNTER — Ambulatory Visit: Payer: Self-pay | Admitting: Family Medicine

## 2024-06-07 MED ORDER — LEVOTHYROXINE SODIUM 112 MCG PO TABS
112.0000 ug | ORAL_TABLET | Freq: Every day | ORAL | 1 refills | Status: DC
  Filled 2024-06-07 – 2024-06-09 (×2): qty 30, 30d supply, fill #0
  Filled 2024-07-06 – 2024-07-07 (×2): qty 30, 30d supply, fill #1
  Filled 2024-08-26: qty 30, 30d supply, fill #2
  Filled ????-??-??: fill #2
  Filled ????-??-??: fill #1

## 2024-06-08 ENCOUNTER — Other Ambulatory Visit: Payer: Self-pay

## 2024-06-08 ENCOUNTER — Other Ambulatory Visit (HOSPITAL_COMMUNITY): Payer: Self-pay

## 2024-06-09 ENCOUNTER — Other Ambulatory Visit (HOSPITAL_COMMUNITY): Payer: Self-pay

## 2024-06-09 ENCOUNTER — Other Ambulatory Visit: Payer: Self-pay

## 2024-06-10 ENCOUNTER — Other Ambulatory Visit: Payer: Self-pay

## 2024-06-11 ENCOUNTER — Other Ambulatory Visit: Payer: Self-pay

## 2024-06-11 ENCOUNTER — Ambulatory Visit: Payer: Self-pay | Admitting: Family Medicine

## 2024-06-11 ENCOUNTER — Other Ambulatory Visit: Payer: Self-pay | Admitting: Family Medicine

## 2024-06-11 DIAGNOSIS — F0781 Postconcussional syndrome: Secondary | ICD-10-CM

## 2024-06-11 NOTE — Patient Outreach (Signed)
 Complex Care Management   Visit Note  06/11/2024  Name:  Pamela Avila MRN: [id number] DOB: 1971/02/22  Situation: Referral received for Complex Care Management related to Balance Problems, post concussion syndrome. I obtained verbal consent from Patient.  Visit completed with Pamela Avila  on the phone. This call focused on getting referral to concussion clinic.    Background:   Past Medical History:  Diagnosis Date   ADHD (attention deficit hyperactivity disorder)    Anemia    Anxiety    Anxiety disorder    Complication of anesthesia    Depression    Family history of diabetes mellitus    GERD (gastroesophageal reflux disease)    Gestational diabetes 1993   Headache    HSV (herpes simplex virus) infection    Hypertension    Hypothyroidism    IBS (irritable bowel syndrome)    Lab test positive for detection of COVID-19 virus    PONV (postoperative nausea and vomiting)    Sleep apnea    Thyroid  disease    hypothroidsim    Assessment: Patient Reported Symptoms:  Cognitive Cognitive Status: No symptoms reported, Alert and oriented to person, place, and time, Insightful and able to interpret abstract concepts Other: History of Post concussion syndrome      Neurological Neurological Review of Symptoms: Headaches Neurological Management Strategies: Medication therapy Neurological Comment: Sent request to PCP for referral to [facility].  HEENT HEENT Symptoms Reported: Not assessed      Cardiovascular Cardiovascular Symptoms Reported: Not assessed    Respiratory Respiratory Symptoms Reported: Not assesed    Endocrine Endocrine Symptoms Reported: Headaches, Weakness or fatigue Is patient diabetic?: No Endocrine Comment: Patient has been taking levothryoxine 112mcg, TSH checked 06/05/24, PCP recommendations to keep doing what she was doing, refill given for 112mcg.  Gastrointestinal Gastrointestinal Symptoms Reported: Not assessed      Genitourinary  Genitourinary Symptoms Reported: Not assessed    Integumentary Integumentary Symptoms Reported: Not assessed    Musculoskeletal Musculoskelatal Symptoms Reviewed: Not assessed        Psychosocial Psychosocial Symptoms Reported: Not assessed          06/11/2024    PHQ2-9 Depression Screening   Little interest or pleasure in doing things    Feeling down, depressed, or hopeless    PHQ-2 - Total Score    Trouble falling or staying asleep, or sleeping too much    Feeling tired or having little energy    Poor appetite or overeating     Feeling bad about yourself - or that you are a failure or have let yourself or your family down    Trouble concentrating on things, such as reading the newspaper or watching television    Moving or speaking so slowly that other people could have noticed.  Or the opposite - being so fidgety or restless that you have been moving around a lot more than usual    Thoughts that you would be better off dead, or hurting yourself in some way    PHQ2-9 Total Score    If you checked off any problems, how difficult have these problems made it for you to do your work, take care of things at home, or get along with other people    Depression Interventions/Treatment      There were no vitals filed for this visit.  MEDICATIONS: No med concerns today, taking as ordered.    Recommendation:   Referral to: [facility] - PCP has already started the referral, per EPIC message today.   Follow Up Plan:   Telephone follow-up two weeks.   Santana Stamp BSN, CCM [place]  [facility] Population Health RN Care Manager Direct Dial: [phone]  Fax: [phone]

## 2024-06-11 NOTE — Patient Instructions (Signed)
 Visit Information  Thank you for taking time to visit with me today. Please don't hesitate to contact me if I can be of assistance to you before our next scheduled appointment.  Your next care management appointment is by telephone on Thursday, November 13th at 3:45pm.   Please call the care guide team at [phone] if you need to cancel, schedule, or reschedule an appointment.   A reminder to ALL patients/family/friends, please call the [place]  National Suicide Prevention Lifeline: (386)449-9716 or TTY: 208-757-2735 TTY 810-410-7315) to talk to a trained counselor if you are experiencing a Mental Health or Behavioral Health Crisis or need someone to talk to.  Santana Stamp BSN, CCM [place]  [facility] Population Health RN Care Manager Direct Dial: [phone]  Fax: [phone]

## 2024-06-13 ENCOUNTER — Other Ambulatory Visit (HOSPITAL_COMMUNITY): Payer: Self-pay

## 2024-06-23 ENCOUNTER — Telehealth: Payer: Self-pay | Admitting: *Deleted

## 2024-06-23 ENCOUNTER — Telehealth: Admitting: *Deleted

## 2024-06-25 ENCOUNTER — Telehealth: Payer: Self-pay

## 2024-06-27 ENCOUNTER — Other Ambulatory Visit: Payer: Self-pay

## 2024-07-06 ENCOUNTER — Other Ambulatory Visit: Payer: Self-pay

## 2024-07-07 ENCOUNTER — Other Ambulatory Visit (HOSPITAL_COMMUNITY): Payer: Self-pay

## 2024-07-07 ENCOUNTER — Other Ambulatory Visit: Payer: Self-pay

## 2024-07-08 ENCOUNTER — Other Ambulatory Visit (HOSPITAL_COMMUNITY): Payer: Self-pay

## 2024-07-08 ENCOUNTER — Telehealth: Payer: Self-pay

## 2024-07-08 ENCOUNTER — Other Ambulatory Visit: Payer: Self-pay

## 2024-07-16 ENCOUNTER — Encounter: Payer: Self-pay | Admitting: Physical Therapy

## 2024-07-16 ENCOUNTER — Ambulatory Visit: Admitting: Physical Therapy

## 2024-07-16 DIAGNOSIS — M79672 Pain in left foot: Secondary | ICD-10-CM | POA: Insufficient documentation

## 2024-07-16 DIAGNOSIS — R159 Full incontinence of feces: Secondary | ICD-10-CM | POA: Insufficient documentation

## 2024-07-16 DIAGNOSIS — M542 Cervicalgia: Secondary | ICD-10-CM | POA: Diagnosis not present

## 2024-07-16 DIAGNOSIS — M5441 Lumbago with sciatica, right side: Secondary | ICD-10-CM | POA: Insufficient documentation

## 2024-07-16 DIAGNOSIS — R2689 Other abnormalities of gait and mobility: Secondary | ICD-10-CM | POA: Diagnosis not present

## 2024-07-16 DIAGNOSIS — M533 Sacrococcygeal disorders, not elsewhere classified: Secondary | ICD-10-CM | POA: Diagnosis not present

## 2024-07-16 DIAGNOSIS — M79671 Pain in right foot: Secondary | ICD-10-CM | POA: Diagnosis not present

## 2024-07-16 DIAGNOSIS — M25561 Pain in right knee: Secondary | ICD-10-CM | POA: Diagnosis not present

## 2024-07-16 DIAGNOSIS — M25562 Pain in left knee: Secondary | ICD-10-CM | POA: Insufficient documentation

## 2024-07-16 DIAGNOSIS — G8929 Other chronic pain: Secondary | ICD-10-CM | POA: Diagnosis not present

## 2024-07-16 NOTE — Therapy (Addendum)
 OUTPATIENT PHYSICAL THERAPY EVALUATION   Patient Name: Pamela Avila MRN: [id number] DOB:Jun 10, 1971, 53 y.o., female Today's Date: 07/16/2024   PT End of Session - 07/16/24 1348     Visit Number 1    Number of Visits 10    Date for Recertification  09/24/24    PT Start Time 1333    PT Stop Time 1415    PT Time Calculation (min) 42 min    Activity Tolerance Patient tolerated treatment well;No increased pain    Behavior During Therapy WFL for tasks assessed/performed          Past Medical History:  Diagnosis Date   ADHD (attention deficit hyperactivity disorder)    Anemia    Anxiety    Anxiety disorder    Complication of anesthesia    Depression    Family history of diabetes mellitus    GERD (gastroesophageal reflux disease)    Gestational diabetes 1993   Headache    HSV (herpes simplex virus) infection    Hypertension    Hypothyroidism    IBS (irritable bowel syndrome)    Lab test positive for detection of COVID-19 virus    PONV (postoperative nausea and vomiting)    Sleep apnea    Thyroid  disease    hypothroidsim   Past Surgical History:  Procedure Laterality Date   BIOPSY  03/20/2023   Procedure: BIOPSY;  Surgeon: Pamela Bi, MD;  Location: [facility] ENDOSCOPY;  Service: Gastroenterology;;   BREAST EXCISIONAL BIOPSY Right 2010   neg surgical breast bx     BREAST SURGERY Right    lumpectomy   CESAREAN SECTION     x 2   COLONOSCOPY WITH PROPOFOL  N/A 12/28/2016   Procedure: COLONOSCOPY WITH PROPOFOL ;  Surgeon: Pamela Bi, MD;  Location: [facility] ENDOSCOPY;  Service: Endoscopy;  Laterality: N/A;   COLONOSCOPY WITH PROPOFOL  N/A 03/20/2023   Procedure: COLONOSCOPY WITH PROPOFOL ;  Surgeon: Pamela Bi, MD;  Location: [facility] ENDOSCOPY;  Service: Gastroenterology;  Laterality: N/A;   CRYOTHERAPY  20 + years ago   cervix   DILITATION & CURRETTAGE/HYSTROSCOPY WITH NOVASURE ABLATION N/A 04/11/2015   Procedure: DILATATION & CURETTAGE/HYSTEROSCOPY WITH NOVASURE ABLATION;   Surgeon: Pamela DELENA Dollar, MD;  Location: [facility] ORS;  Service: Gynecology;  Laterality: N/A;   ESOPHAGOGASTRODUODENOSCOPY (EGD) WITH PROPOFOL  N/A 12/28/2016   Procedure: ESOPHAGOGASTRODUODENOSCOPY (EGD) WITH PROPOFOL ;  Surgeon: Pamela Bi, MD;  Location: [facility] ENDOSCOPY;  Service: Endoscopy;  Laterality: N/A;   ESSURE TUBAL LIGATION     lumpectomy Right    MASS EXCISION Right 01/30/2023   Procedure: EXCISION MASS, soft tissue right buttock;  Surgeon: Pamela Shope, MD;  Location: [facility] ORS;  Service: General;  Laterality: Right;   NASAL SEPTOPLASTY W/ TURBINOPLASTY Bilateral 10/07/2018   Procedure: SEPTOPLASTY WITH BILATERAL SUBMUCOUS RESECTION OF INFERIOR TURBINATES;  Surgeon: Pamela Miu, MD;  Location: [facility] ORS;  Service: ENT;  Laterality: Bilateral;   TONSILLECTOMY     TUBAL LIGATION     Patient Active Problem List   Diagnosis Date Noted   Chronic migraine with aura without status migrainosus, not intractable 05/26/2024   Full incontinence of feces 04/19/2024   Palpable mass of soft tissue of hip 01/23/2023   Postconcussion syndrome 02/14/2022   Obesity (BMI 30.0-34.9) 06/29/2019   Obstructive sleep apnea treated with continuous positive airway pressure (CPAP) 10/16/2018   Excessive daytime sleepiness 07/26/2018   Inadequate sleep hygiene 07/26/2018   Dysthymia 07/26/2018   Circadian rhythm sleep disorder, shift work type 07/26/2018   Status post endometrial ablation  01/09/2018   Right atrial enlargement 05/26/2015   Family history of diabetes mellitus    ADHD (attention deficit hyperactivity disorder) 02/01/2015   Genital herpes 02/01/2015   Irritable bowel syndrome 02/01/2015   Hypothyroidism 02/01/2015   Anxiety and depression 02/01/2015   Benign essential hypertension 02/01/2015   GERD (gastroesophageal reflux disease) 02/01/2015   Tobacco user 02/01/2015    PCP:  Pamela Bouchard, DO   REFERRING PROVIDER: Vicci Bouchard, DO   REFERRING DIAG: Incontinences of  feces   Rationale for Evaluation and Treatment Rehabilitation  THERAPY DIAG:  Sacrococcygeal disorders, not elsewhere classified  Other abnormalities of gait and mobility  Chronic right-sided low back pain with right-sided sciatica  Chronic pain of both knees  Pain in both feet  Neck pain   ONSET DATE:   SUBJECTIVE:                                                                                                                                                                                           SUBJECTIVE STATEMENT ON EVAL 07/16/24 : 1) fecal leakage:   it occurred after her concussion from a fall onto , head hitting a metal bar. [name] occurs in the middle of the night without her awareness. It is a full bowel movement. Pt has IBS, Right now she is hard solid stools. So it is not happening as much right now. Leakage occurs when she is in her diarrhea stage of IBS. CSometimes it occurs a couple times a week or monthly.  Pt has seen a GI doctor done a colonsscopy. Pt has stopped eating high glucose corn syrup and noticed less diarrhea   2)  B LBP, radiating down R lateral thigh above the knee:  3-7 /10 pain. Takes tylenol  and Ibuprofen  several times a day. Occurs with standing / walking   > 20 min   3) B knee pain, R knee is worst  (10/10 with going down a step)  than L ( 2-3/10 ) : difficulty with stairs descending, and going one step at a step .  Occurred for years even  before her concussion in 2023.   4) B feet pain, L foot  worst than R:  occurs with walking around the house  .  Worse after her recent fall .  5) neck pain: the base of the skull started after her concussion in 2023. Neck Pain impacts her turning her head with driving, reaching up in the cabinets     PERTINENT HISTORY:  Hx of falls:   Fall with a concussion  June 2023: injury to head, since this fall, pt noticed her balance is  off, not notice environment, noticing fecal leakage while sleeping  . Pt had  stepped backwards and tripped over a block and hit her head on a metal bar.  Memory and focus issues started. Did not go get concussion therapy. Did not go to the doctor right away. Pt did see neurologist.      Last week : fell while training dog on a leash , landing in [place] , impacting L ankle , knee with bruise,R thigh, soreness along the SIJ   2 C section R breast lumpectomy for biosy Tubal ligation Ablation procedure due to abnormal bleeding  PAIN:  Are you having pain? Yes: see above   PRECAUTIONS: No  WEIGHT BEARING RESTRICTIONS: No   FALLS:  Has patient fallen in last 6 months? Yes, last week , pt fell   LIVING ENVIRONMENT: Lives with: boyfriend Lives in: one story home  Stairs: 4 STE with rail   OCCUPATION: not working currently, filing for disability since the concussion , her ADHD worsened after the concussion   PLOF: IND   PATIENT GOALS:  Improve neck pain, back / knee / feet  pain, fecal leakage , balance      OBJECTIVE:   [facility] PT Assessment - 07/16/24 1348       Palpation   Spinal mobility WFL but caused radiating down R lateral thigh to above knee after all directions performed    SI assessment  R shoulder, L iliac crest higher      Ambulation/Gait   Gait Comments 1.03 m/s, decreased on L leg, R hip hike,  hyperextended knees            Self-Care   Self-Care Other Self-Care Comments    Other Self-Care Comments  explained regional interdependent approach to address pain at different body parts, explained upcoming visits to realign posture and spine.pelvis to improve Sx and achieve goals, showed anatomy images of deep core system ,      Therapeutic Activites    Therapeutic Activities Other Therapeutic Activities    Other Therapeutic Activities inquired about meaningful tasks and role of learning proper body mechanics, future sessions to help realign pelvis and spine and to learn co-activation of deep core system when performing against gravity  tasks . These improvements will help address pain and Sx     Neuro Re-ed    Neuro Re-ed Details  cued for alignment and proprioception of pelvis and LKC and spine in  sit to stand and for proper sitting posture to activate deep core system            HOME EXERCISE PROGRAM: See pt instruction section    ASSESSMENT:  CLINICAL IMPRESSION: Pt is a  53 yo  who presents with  following issues which impact QOL, ADL,  fitness, social and community activities:    1) fecal leakage  2)  B LBP, radiating down R lateral thigh above the knee  3) B knee pain, R knee is worst than L  4) B feet pain, L foot worst than R  Pt's musculoskeletal assessment revealed uneven pelvic girdle and shoulder height, asymmetries to gait pattern, limited spinal /pelvic mobility, dyscoordination and strength of pelvic floor mm, poor body mechanics which places strain on the abdominal/pelvic floor mm. These are deficits that indicate an ineffective intraabdominal pressure system associated with increased risk for pt's Sx.  Contributing factors to consider in POC: 2 C section R breast lumpectomy for biosy Tubal ligation Ablation procedure due to abnormal bleeding  Pt will benefit from propioception/ coordination/ body mechanics training and education with gravity-loaded tasks at work and home and  fitness modifications in order to gain a more effective intraabdominal pressure system to minimize Sx. Advised pt to not perform sit-ups and crunches as these movement patterns lead to more downward forces on the pelvic floor, negatively impacting abdominopelvic/spinal dysfunctions.                                 Pt reported she hs had ADHD. Since her concussion 2023, pt had problems with focus and memory. Plan to ensure more time and simplicity of HEP instruction.   Pt was provided education on etiology of Sx with anatomy, physiology explanation with images along with the benefits of customized pelvic PT Tx  based on pt's medical conditions and musculoskeletal deficits.  Explained the physiology of deep core mm coordination and roles of pelvic floor function/  lower kinetic chain ( LKC) , spinal/pelvic alignment for urination, defecation, sexual function, and postural control with deep core mm system, and the role of posture and alignment to help pelvic issues.    Following Tx today which pt tolerated without complaints,  pt demo'd proper body mechanics to minimize straining pelvic floor.  Plan to address realignment of spine/ pelvis at next session to help promote optimize intra-abdominal pressure system (IAP)  for improved pelvic floor function, trunk stability, gait, balance, stabilization with mobility tasks.  Plan to address pelvic floor issues once pelvis and spine are realigned to yield better outcomes.    Regional interdependent approaches will yield greater benefits in pt's POC due to the complexity of pt's medical Hx and the significant impact their Sx have had on their QOL. Pt would benefit from a biopsychosocial approach to yield optimal outcomes. Plan to build interdisciplinary team with pt's providers to optimize patient-centered care as needed.                                                      Pt benefits from skilled PT.    OBJECTIVE IMPAIRMENTS decreased activity tolerance, decreased coordination, decreased endurance, decreased mobility, difficulty walking, decreased ROM, decreased strength, decreased safety awareness, hypomobility, increased muscle spasms, impaired flexibility, improper body mechanics, postural dysfunction, and pain. scar restrictions   ACTIVITY LIMITATIONS  self-care,  sleep, home chores, work tasks    PARTICIPATION LIMITATIONS:  community, social  activities    PERSONAL FACTORS   affecting patient's functional outcome:    REHAB POTENTIAL: Good   CLINICAL DECISION MAKING: Evolving/moderate complexity   EVALUATION COMPLEXITY: Moderate    PATIENT  EDUCATION:    Education details: Showed pt anatomy images. Explained muscles attachments/ connection, physiology of deep core system/ spinal- thoracic-pelvis-lower kinetic chain as they relate to pt's presentation, Sx, and past Hx. Explained what and how these areas of deficits need to be restored to balance and function    See Therapeutic activity / neuromuscular re-education section  Answered pt's questions.   Person educated: Patient Education method: Explanation, Demonstration, Tactile cues, Verbal cues, and Handouts Education comprehension: verbalized understanding, returned demonstration, verbal cues required, tactile cues required, and needs further education     PLAN: PT FREQUENCY: 1x/week   PT DURATION: 10 weeks   PLANNED INTERVENTIONS:   Gait training;Stair training;Functional mobility  training;DME Instruction;Therapeutic activities;Therapeutic exercise;Balance training;Neuromuscular re-education;Patient/family education;Vestibular;Visual/perceptual remediation/compensation;Passive range of motion;Moist Heat;Cryotherapy;Traction;Canalith Repostioning;Joint Manipulations;Manual lymph drainage;Manual techniques;Scar mobilization;Energy conservation;Dry needling;ADLs/Self Care Home Management;Biofeedback;Electrical Stimulation;Taping    PLAN FOR NEXT SESSION: See clinical impression for plan     GOALS: Goals reviewed with patient? Yes  SHORT TERM GOALS: Target date: 08/13/2024    Pt will demo IND with HEP                    Baseline: Not IND            Goal status: INITIAL   LONG TERM GOALS: Target date: 09/24/2024    1.Pt will demo proper deep core coordination without chest breathing and optimal excursion of diaphragm/pelvic floor in order to promote spinal stability and pelvic floor function  Baseline: dyscoordination Goal status: INITIAL  2.  Pt will demo proper body mechanics in against gravity tasks and ADLs  work tasks, fitness  to minimize straining pelvic  floor / back    Baseline: not IND, improper form that places strain on pelvic floor  Goal status: INITIAL    3. Pt will demo increased gait speed > 1.3 m/s with reciprocal gait pattern, longer stride length  in order to ambulate safely in community and return to fitness routine  Baseline: 1.03 m/s, decreased on L leg, R hip hike,  hyperextended knees  Goal status: INITIAL    4. Pt will demo levelled pelvic girdle and shoulder height in order to progress to deep core strengthening HEP and restore mobility at spine, pelvis, gait, posture minimize falls, and improve balance  Baseline: R shoulder, L iliac crest higher  Goal status: INITIAL   5. Pt will improve PFDI-7 questionnaire to  pts  score change  to demo improved QOL  Baseline:    ( greater pts indicate greater negative impact on QOL)   124 pts  ( total)  24  pts  ( UIQ-7 )  52  pts  ( CRAIQ-7 )  48   pts  ( POPIQ-7 ) Goal status: INITIAL   6.   Baseline:  Neck Pain impacts her turning her head with driving, reaching up in the cabinets  Goal status: INITIAL   Pia Lupe Plump, PT 07/16/2024, 1:56 PM

## 2024-07-16 NOTE — Patient Instructions (Signed)

## 2024-07-21 ENCOUNTER — Ambulatory Visit: Admitting: Family Medicine

## 2024-07-22 NOTE — Addendum Note (Signed)
 Addended by: PIA FISCAL on: 07/22/2024 10:43 AM   Modules accepted: Orders

## 2024-07-23 ENCOUNTER — Ambulatory Visit: Admitting: Physical Therapy

## 2024-07-23 DIAGNOSIS — M533 Sacrococcygeal disorders, not elsewhere classified: Secondary | ICD-10-CM | POA: Diagnosis not present

## 2024-07-23 DIAGNOSIS — M25562 Pain in left knee: Secondary | ICD-10-CM | POA: Diagnosis not present

## 2024-07-23 DIAGNOSIS — R159 Full incontinence of feces: Secondary | ICD-10-CM | POA: Diagnosis not present

## 2024-07-23 DIAGNOSIS — R2689 Other abnormalities of gait and mobility: Secondary | ICD-10-CM

## 2024-07-23 DIAGNOSIS — M542 Cervicalgia: Secondary | ICD-10-CM

## 2024-07-23 DIAGNOSIS — M5441 Lumbago with sciatica, right side: Secondary | ICD-10-CM | POA: Diagnosis not present

## 2024-07-23 DIAGNOSIS — G8929 Other chronic pain: Secondary | ICD-10-CM

## 2024-07-23 DIAGNOSIS — M79672 Pain in left foot: Secondary | ICD-10-CM | POA: Diagnosis not present

## 2024-07-23 DIAGNOSIS — M79671 Pain in right foot: Secondary | ICD-10-CM | POA: Diagnosis not present

## 2024-07-23 DIAGNOSIS — M25561 Pain in right knee: Secondary | ICD-10-CM | POA: Diagnosis not present

## 2024-07-23 NOTE — Patient Instructions (Signed)
 Walking arm swings and trunk rotation when walking , higher knees   __   Avoid straining pelvic floor, abdominal muscles , spine  Use log rolling technique instead of getting out of bed with your neck or the sit-up     Log rolling into and out of bed   Log rolling into and out of bed If getting out of bed on R side, Bent knees, scoot hips/ shoulder to L  Raise R arm completely overhead, rolling onto armpit  Then lower bent knees to bed to get into complete side lying position  Then drop legs off bed, and push up onto R elbow/forearm, and use L hand to push onto the bed    Dig elbows and feet to lift hte buttocks and scoot without lifting head   ___  Lengthen Back rib by R  shoulder   ( winging)  Lie on L  side , pillow between knees and under head  Pull  R arm overhead over mattress, grab the edge of mattress,pull it upward, drawing elbow away from ears  Breathing 10 reps Brushing arm with 3/4 turn onto pillow behind back  Lying on L  side ,Pillow/ Block between knees  dragging top forearm across ribs below breast rotating 3/4 turn,  rotating  _R_ only this week ,  relax onto the pillow behind the back  and then back to other palm , maintain top palm on body whole top and not lift shoulder Do this side this week       Wait do both sides until we have levelled out your spine

## 2024-07-23 NOTE — Therapy (Addendum)
 OUTPATIENT PHYSICAL THERAPY TREATMENT    Patient Name: Pamela Avila MRN: [id number] DOB:1971/03/19, 53 y.o., female Today's Date: 07/23/2024   PT End of Session - 07/23/24 1341     Visit Number 2    Number of Visits 10    Date for Recertification  09/24/24    PT Start Time 1341    PT Stop Time 1420    PT Time Calculation (min) 39 min    Activity Tolerance Patient tolerated treatment well;No increased pain    Behavior During Therapy WFL for tasks assessed/performed          Past Medical History:  Diagnosis Date   ADHD (attention deficit hyperactivity disorder)    Anemia    Anxiety    Anxiety disorder    Complication of anesthesia    Depression    Family history of diabetes mellitus    GERD (gastroesophageal reflux disease)    Gestational diabetes 1993   Headache    HSV (herpes simplex virus) infection    Hypertension    Hypothyroidism    IBS (irritable bowel syndrome)    Lab test positive for detection of COVID-19 virus    PONV (postoperative nausea and vomiting)    Sleep apnea    Thyroid  disease    hypothroidsim   Past Surgical History:  Procedure Laterality Date   BIOPSY  03/20/2023   Procedure: BIOPSY;  Surgeon: Therisa Bi, MD;  Location: [facility] ENDOSCOPY;  Service: Gastroenterology;;   BREAST EXCISIONAL BIOPSY Right 2010   neg surgical breast bx     BREAST SURGERY Right    lumpectomy   CESAREAN SECTION     x 2   COLONOSCOPY WITH PROPOFOL  N/A 12/28/2016   Procedure: COLONOSCOPY WITH PROPOFOL ;  Surgeon: Therisa Bi, MD;  Location: [facility] ENDOSCOPY;  Service: Endoscopy;  Laterality: N/A;   COLONOSCOPY WITH PROPOFOL  N/A 03/20/2023   Procedure: COLONOSCOPY WITH PROPOFOL ;  Surgeon: Therisa Bi, MD;  Location: [facility] ENDOSCOPY;  Service: Gastroenterology;  Laterality: N/A;   CRYOTHERAPY  20 + years ago   cervix   DILITATION & CURRETTAGE/HYSTROSCOPY WITH NOVASURE ABLATION N/A 04/11/2015   Procedure: DILATATION & CURETTAGE/HYSTEROSCOPY WITH NOVASURE ABLATION;   Surgeon: Gladis DELENA Dollar, MD;  Location: [facility] ORS;  Service: Gynecology;  Laterality: N/A;   ESOPHAGOGASTRODUODENOSCOPY (EGD) WITH PROPOFOL  N/A 12/28/2016   Procedure: ESOPHAGOGASTRODUODENOSCOPY (EGD) WITH PROPOFOL ;  Surgeon: Therisa Bi, MD;  Location: [facility] ENDOSCOPY;  Service: Endoscopy;  Laterality: N/A;   ESSURE TUBAL LIGATION     lumpectomy Right    MASS EXCISION Right 01/30/2023   Procedure: EXCISION MASS, soft tissue right buttock;  Surgeon: Lane Shope, MD;  Location: [facility] ORS;  Service: General;  Laterality: Right;   NASAL SEPTOPLASTY W/ TURBINOPLASTY Bilateral 10/07/2018   Procedure: SEPTOPLASTY WITH BILATERAL SUBMUCOUS RESECTION OF INFERIOR TURBINATES;  Surgeon: Herminio Miu, MD;  Location: [facility] ORS;  Service: ENT;  Laterality: Bilateral;   TONSILLECTOMY     TUBAL LIGATION     Patient Active Problem List   Diagnosis Date Noted   Chronic migraine with aura without status migrainosus, not intractable 05/26/2024   Full incontinence of feces 04/19/2024   Palpable mass of soft tissue of hip 01/23/2023   Postconcussion syndrome 02/14/2022   Obesity (BMI 30.0-34.9) 06/29/2019   Obstructive sleep apnea treated with continuous positive airway pressure (CPAP) 10/16/2018   Excessive daytime sleepiness 07/26/2018   Inadequate sleep hygiene 07/26/2018   Dysthymia 07/26/2018   Circadian rhythm sleep disorder, shift work type 07/26/2018   Status post endometrial  ablation 01/09/2018   Right atrial enlargement 05/26/2015   Family history of diabetes mellitus    ADHD (attention deficit hyperactivity disorder) 02/01/2015   Genital herpes 02/01/2015   Irritable bowel syndrome 02/01/2015   Hypothyroidism 02/01/2015   Anxiety and depression 02/01/2015   Benign essential hypertension 02/01/2015   GERD (gastroesophageal reflux disease) 02/01/2015   Tobacco user 02/01/2015    PCP:  Vicci Bouchard, DO   REFERRING PROVIDER: Vicci Bouchard, DO   REFERRING DIAG: Incontinences of  feces   Rationale for Evaluation and Treatment Rehabilitation  THERAPY DIAG:  Sacrococcygeal disorders, not elsewhere classified  Other abnormalities of gait and mobility  Chronic right-sided low back pain with right-sided sciatica  Chronic pain of both knees  Pain in both feet  Neck pain    ONSET DATE:   SUBJECTIVE:           SUBJECTIVE STATEMENT TODAY:   Pt reports R sided headache but denied dizziness nor vertigo. Pt reports this HA is not different from her usual HA.   Pt has been woken up in the middle of the night due to B feet pain ( L is worst than R)   L hip also hurts  4/10  pain  and R LBP  2/10 pain  also bothers her today .     Pt denied radiating pain.                                                                                                                                                             SUBJECTIVE STATEMENT ON EVAL 07/16/24 : 1) fecal leakage:   it occurred after her concussion from a fall onto , head hitting a metal bar. [name] occurs in the middle of the night without her awareness. It is a full bowel movement. Pt has IBS, Right now she is hard solid stools. So it is not happening as much right now. Leakage occurs when she is in her diarrhea stage of IBS. Sometimes it occurs a couple times a week or monthly.  Pt has seen a GI doctor done a colonsscopy. Pt has stopped eating high glucose corn syrup and noticed less diarrhea   2)  B LBP, radiating down R lateral thigh above the knee:  3-7 /10 pain. Takes tylenol  and Ibuprofen  several times a day. Occurs with standing / walking   > 20 min   3) B knee pain, R knee is worst  (10/10 with going down a step)  than L ( 2-3/10 ) : difficulty with stairs descending, and going one step at a step .  Occurred for years even  before her concussion in 2023.   4) B feet pain, L foot  worst than R:  occurs with walking around the house  .  Worse after  her recent fall .  5) neck pain: the base of the skull  started after her concussion in 2023. Neck Pain impacts her turning her head with driving, reaching up in the cabinets     PERTINENT HISTORY:  Hx of falls:   Fall with a concussion  June 2023: injury to head, since this fall, pt noticed her balance is off, not notice environment, noticing fecal leakage while sleeping  . Pt had stepped backwards and tripped over a block and hit her head on a metal bar.  Memory and focus issues started. Did not go get concussion therapy. Did not go to the doctor right away. Pt did see neurologist.      Last week : fell while training dog on a leash , landing in [place] , impacting L ankle , knee with bruise,R thigh, soreness along the SIJ   2 C section R breast lumpectomy for biosy Tubal ligation Ablation procedure due to abnormal bleeding  PAIN:  Are you having pain? Yes: see above   PRECAUTIONS: No  WEIGHT BEARING RESTRICTIONS: No   FALLS:  Has patient fallen in last 6 months? Yes, last week , pt fell   LIVING ENVIRONMENT: Lives with: boyfriend Lives in: one story home  Stairs: 4 STE with rail   OCCUPATION: not working currently, filing for disability since the concussion , her ADHD worsened after the concussion   PLOF: IND   PATIENT GOALS:  Improve neck pain, back / knee / feet  pain, fecal leakage , balance      OBJECTIVE:     [facility] PT Assessment - 07/23/24 1349       Palpation   SI assessment  Standing/ sitting/ supine: L shoulder/ L iliac crest higher,  Leg length measured 79 cm B medial malleoli to ASIS B    Palpation comment hypomobile thoracic/lumbar junction, tenderness T10-12 deviated to L, tightness along interspinal / paraspinals      Bed Mobility   Bed Mobility --   crunch method sit to supine         [facility] Adult PT Treatment/Exercise - 07/23/24 1349       Neuro Re-ed    Neuro Re-ed Details  provided propioceptive cues for thoracic stretches to realign spine and pelvis to minimize L hip/ R LBP , neck pain,   modified degree of thoracic / cervical rotation to minimize HA,  motor planning for logrolling to minimize straining pelvic floor and spine. provided body awareness for arm swings and trunk rotation when walking , higher knees      Modalities   Modalities Moist Heat      Moist Heat Therapy   Number Minutes Moist Heat 5 Minutes    Moist Heat Location --   R posterior rotaion , supported back and knees     Manual Therapy   Manual therapy comments STM/MWM at T/L junction and problem areas noted ina ssessment, modified technique to accmmodate pain             HOME EXERCISE PROGRAM: See pt instruction section    ASSESSMENT:  CLINICAL IMPRESSION:                               Pt arrived late, hence shorter session today  Following Tx today which pt tolerated without complaints, pt's levelled pelvis and shoulders, increased T/L junction mobility. Pt reported less LBP and L LBP pain.  provided propioceptive cues for  thoracic stretches to realign spine and pelvis to minimize L hip/ R LBP , neck pain,   modified degree of thoracic / cervical rotation to minimize HA,   motor planning for logrolling to minimize straining pelvic floor and spine. Provided body awareness for arm swings and trunk rotation when walking , higher knee for improved gait mechanics.    Plan to address her c/o feet pain next session   Plan to reassess realignment of spine/ pelvis at next session to help promote optimize intra-abdominal pressure system (IAP)  for improved pelvic floor function, trunk stability, gait, balance, stabilization with mobility tasks.  Plan to address pelvic floor issues once pelvis and spine are realigned to yield better outcomes.    Regional interdependent approaches will yield greater benefits in pt's POC due to the complexity of pt's medical Hx and the significant impact their Sx have had on their QOL.                                                       Pt benefits from skilled PT  to address:  1) fecal leakage  2)  B LBP, radiating down R lateral thigh above the knee  3) B knee pain, R knee is worst than L  4) B feet pain, L foot worst than R.    OBJECTIVE IMPAIRMENTS decreased activity tolerance, decreased coordination, decreased endurance, decreased mobility, difficulty walking, decreased ROM, decreased strength, decreased safety awareness, hypomobility, increased muscle spasms, impaired flexibility, improper body mechanics, postural dysfunction, and pain. scar restrictions   ACTIVITY LIMITATIONS  self-care,  sleep, home chores, work tasks    PARTICIPATION LIMITATIONS:  community, social  activities    PERSONAL FACTORS   affecting patient's functional outcome:    REHAB POTENTIAL: Good   CLINICAL DECISION MAKING: Evolving/moderate complexity   EVALUATION COMPLEXITY: Moderate    PATIENT EDUCATION:    Education details: Showed pt anatomy images. Explained muscles attachments/ connection, physiology of deep core system/ spinal- thoracic-pelvis-lower kinetic chain as they relate to pt's presentation, Sx, and past Hx. Explained what and how these areas of deficits need to be restored to balance and function    See Therapeutic activity / neuromuscular re-education section  Answered pt's questions.   Person educated: Patient Education method: Explanation, Demonstration, Tactile cues, Verbal cues, and Handouts Education comprehension: verbalized understanding, returned demonstration, verbal cues required, tactile cues required, and needs further education     PLAN: PT FREQUENCY: 1x/week   PT DURATION: 10 weeks   PLANNED INTERVENTIONS:   Gait training;Stair training;Functional mobility training;DME Instruction;Therapeutic activities;Therapeutic exercise;Balance training;Neuromuscular re-education;Patient/family education;Vestibular;Visual/perceptual remediation/compensation;Passive range of motion;Moist Heat;Cryotherapy;Traction;Canalith Repostioning;Joint  Manipulations;Manual lymph drainage;Manual techniques;Scar mobilization;Energy conservation;Dry needling;ADLs/Self Care Home Management;Biofeedback;Electrical Stimulation;Taping    PLAN FOR NEXT SESSION: See clinical impression for plan     GOALS: Goals reviewed with patient? Yes  SHORT TERM GOALS: Target date: 08/13/2024    Pt will demo IND with HEP                    Baseline: Not IND            Goal status: INITIAL   LONG TERM GOALS: Target date: 09/24/2024    1.Pt will demo proper deep core coordination without chest breathing and optimal excursion of diaphragm/pelvic floor in order to promote spinal stability and pelvic floor  function  Baseline: dyscoordination Goal status: INITIAL  2.  Pt will demo proper body mechanics in against gravity tasks and ADLs  work tasks, fitness  to minimize straining pelvic floor / back    Baseline: not IND, improper form that places strain on pelvic floor  Goal status: INITIAL    3. Pt will demo increased gait speed > 1.3 m/s with reciprocal gait pattern, longer stride length  in order to ambulate safely in community and return to fitness routine  Baseline: 1.03 m/s, decreased on L leg, R hip hike,  hyperextended knees  Goal status: INITIAL    4. Pt will demo levelled pelvic girdle and shoulder height in order to progress to deep core strengthening HEP and restore mobility at spine, pelvis, gait, posture minimize falls, and improve balance  Baseline: R shoulder, L iliac crest higher  Goal status: INITIAL   5. Pt will improve PFDI-7 questionnaire to  pts  score change  to demo improved QOL  Baseline:    ( greater pts indicate greater negative impact on QOL)   124 pts  ( total)  24  pts  ( UIQ-7 )  52  pts  ( CRAIQ-7 )  48   pts  ( POPIQ-7 ) Goal status: INITIAL   6.  Pt will be compliant with neck and spinal HEP Pt will report decreased neck pain  >50% with  her turning her head with driving, reaching up in the cabinets    Baseline:  Neck Pain impacts her turning her head with driving, reaching up in the cabinets  Goal status: INITIAL  7.  Pt will demo proper alignment and propioception of LKC with stair ascension/ descension , report < 5/10 pain descending step) in order to navigate community  Baseline/: B knee pain, R knee is worst  (10/10 with going down a step)  than L ( 2-3/10 ) : difficulty with stairs descending, and going one step at a step .  Occurred for years even  before her concussion in 2023.   8.  Pt will report minimal B feet pain< 2/10 with walking around her house in order to build a walking routine for health and wellness  Baseline: B feet pain, L foot , worst than R:  occurs with walking around the house  .  Worse after her recent fall . Goal status: INITIAL  Pia Lupe Plump, PT 07/23/2024, 1:45 PM

## 2024-07-24 ENCOUNTER — Ambulatory Visit: Admitting: Family Medicine

## 2024-07-24 ENCOUNTER — Encounter: Payer: Self-pay | Admitting: Family Medicine

## 2024-07-24 VITALS — BP 125/85 | HR 80 | Temp 98.0°F | Ht 61.0 in | Wt 182.4 lb

## 2024-07-24 DIAGNOSIS — F419 Anxiety disorder, unspecified: Secondary | ICD-10-CM

## 2024-07-24 DIAGNOSIS — E66811 Obesity, class 1: Secondary | ICD-10-CM | POA: Diagnosis not present

## 2024-07-24 DIAGNOSIS — G43E09 Chronic migraine with aura, not intractable, without status migrainosus: Secondary | ICD-10-CM | POA: Diagnosis not present

## 2024-07-24 DIAGNOSIS — E038 Other specified hypothyroidism: Secondary | ICD-10-CM | POA: Diagnosis not present

## 2024-07-24 DIAGNOSIS — F0781 Postconcussional syndrome: Secondary | ICD-10-CM

## 2024-07-24 DIAGNOSIS — Z23 Encounter for immunization: Secondary | ICD-10-CM | POA: Diagnosis not present

## 2024-07-24 NOTE — Progress Notes (Unsigned)
 BP 125/85   Pulse 80   Temp 98 F (36.7 C) (Oral)   Ht 5' 1 (1.549 m)   Wt 182 lb 6.4 oz (82.7 kg)   SpO2 98%   BMI 34.46 kg/m    Subjective:    Patient ID: Pamela Avila, female    DOB: [birth year], 53 y.o.   MRN: [id number]  HPI: Pamela Avila is a 53 y.o. female  Chief Complaint  Patient presents with   Depression   Anxiety   Migraine   ANXIETY/STRESS Duration:{Blank single:19197::controlled,uncontrolled,better,worse,exacerbated,stable} Anxious mood: {Blank single:19197::yes,no}  Excessive worrying: {Blank single:19197::yes,no} Irritability: {Blank single:19197::yes,no}  Sweating: {Blank single:19197::yes,no} Nausea: {Blank single:19197::yes,no} Palpitations:{Blank single:19197::yes,no} Hyperventilation: {Blank single:19197::yes,no} Panic attacks: {Blank single:19197::yes,no} Agoraphobia: {Blank single:19197::yes,no}  Obscessions/compulsions: {Blank single:19197::yes,no} Depressed mood: {Blank single:19197::yes,no}    05/26/2024    4:38 PM 04/28/2024    4:19 PM 04/22/2024    4:34 PM 04/14/2024    2:02 PM 03/02/2024    3:22 PM  Depression screen PHQ 2/9  Decreased Interest 2 3 0 0 1  Down, Depressed, Hopeless 2 3 0 2 1  PHQ - 2 Score 4 6 0 2 2  Altered sleeping 3 3  1 3   Tired, decreased energy 2 3  3 3   Change in appetite 1 2  1  0  Feeling bad or failure about yourself  1 1  0 0  Trouble concentrating 3 3  3 3   Moving slowly or fidgety/restless 2 3  2 3   Suicidal thoughts 1 0  0 0  PHQ-9 Score 17  21   12  14    Difficult doing work/chores Very difficult Very difficult  Very difficult Extremely dIfficult     Data saved with a previous flowsheet row definition   Anhedonia: {Blank single:19197::yes,no} Weight changes: {Blank single:19197::yes,no} Insomnia: {Blank single:19197::yes,no} {Blank single:19197::hard to fall asleep,hard to stay asleep}  Hypersomnia: {Blank  single:19197::yes,no} Fatigue/loss of energy: {Blank single:19197::yes,no} Feelings of worthlessness: {Blank single:19197::yes,no} Feelings of guilt: {Blank single:19197::yes,no} Impaired concentration/indecisiveness: {Blank single:19197::yes,no} Suicidal ideations: {Blank single:19197::yes,no}  Crying spells: {Blank single:19197::yes,no} Recent Stressors/Life Changes: {Blank single:19197::yes,no}   Relationship problems: {Blank single:19197::yes,no}   Family stress: {Blank single:19197::yes,no}     Financial stress: {Blank single:19197::yes,no}    Job stress: {Blank single:19197::yes,no}    Recent death/loss: {Blank single:19197::yes,no}  Has been doing well with the nurtec. She is past due on her emgality  due to being   Relevant past medical, surgical, family and social history reviewed and updated as indicated. Interim medical history since our last visit reviewed. Allergies and medications reviewed and updated.  Review of Systems  Per HPI unless specifically indicated above     Objective:    BP 125/85   Pulse 80   Temp 98 F (36.7 C) (Oral)   Ht 5' 1 (1.549 m)   Wt 182 lb 6.4 oz (82.7 kg)   SpO2 98%   BMI 34.46 kg/m   Wt Readings from Last 3 Encounters:  07/24/24 182 lb 6.4 oz (82.7 kg)  05/26/24 166 lb 6.4 oz (75.5 kg)  04/14/24 166 lb (75.3 kg)    Physical Exam  Results for orders placed or performed in visit on 06/05/24  TSH   Collection Time: 06/05/24  1:19 PM  Result Value Ref Range   TSH 0.855 0.450 - 4.500 uIU/mL      Assessment & Plan:   Problem List Items Addressed This Visit   None    Follow up plan: No follow-ups on file.

## 2024-07-25 LAB — TSH: TSH: 27.8 u[IU]/mL — ABNORMAL HIGH (ref 0.450–4.500)

## 2024-07-26 ENCOUNTER — Other Ambulatory Visit: Payer: Self-pay

## 2024-07-27 ENCOUNTER — Ambulatory Visit: Payer: Self-pay | Admitting: Family Medicine

## 2024-07-27 ENCOUNTER — Other Ambulatory Visit: Payer: Self-pay

## 2024-07-27 ENCOUNTER — Other Ambulatory Visit (HOSPITAL_COMMUNITY): Payer: Self-pay

## 2024-07-27 ENCOUNTER — Encounter: Payer: Self-pay | Admitting: Family Medicine

## 2024-07-27 NOTE — Assessment & Plan Note (Signed)
 Encouraged diet and exercise with goal of losing 1-2lbs per week. Call with any concerns.

## 2024-07-27 NOTE — Assessment & Plan Note (Signed)
 Rechecking labs today. Await results. Treat as needed.

## 2024-07-27 NOTE — Assessment & Plan Note (Signed)
 Referral to neurology placed today. Feeling better. Call with any concerns.

## 2024-07-27 NOTE — Assessment & Plan Note (Signed)
 Under good control on current regimen. Continue current regimen. Continue to monitor. Call with any concerns. Refills given.

## 2024-07-27 NOTE — Assessment & Plan Note (Signed)
 Doing significantly better on the nurtec. Continue current regimen. Continue to monitor. Referral to neurology placed. Call with any concerns.

## 2024-07-28 ENCOUNTER — Other Ambulatory Visit: Payer: Self-pay

## 2024-07-28 ENCOUNTER — Other Ambulatory Visit: Payer: Self-pay | Admitting: Family Medicine

## 2024-07-28 DIAGNOSIS — E038 Other specified hypothyroidism: Secondary | ICD-10-CM

## 2024-07-29 ENCOUNTER — Other Ambulatory Visit (HOSPITAL_COMMUNITY): Payer: Self-pay

## 2024-07-29 ENCOUNTER — Other Ambulatory Visit: Payer: Self-pay

## 2024-07-30 ENCOUNTER — Other Ambulatory Visit: Payer: Self-pay

## 2024-07-30 ENCOUNTER — Ambulatory Visit: Admitting: Physical Therapy

## 2024-07-31 ENCOUNTER — Other Ambulatory Visit (HOSPITAL_COMMUNITY): Payer: Self-pay

## 2024-07-31 ENCOUNTER — Other Ambulatory Visit: Payer: Self-pay

## 2024-08-03 ENCOUNTER — Other Ambulatory Visit (HOSPITAL_COMMUNITY): Payer: Self-pay

## 2024-08-04 ENCOUNTER — Ambulatory Visit: Admitting: Physical Therapy

## 2024-08-04 DIAGNOSIS — M533 Sacrococcygeal disorders, not elsewhere classified: Secondary | ICD-10-CM | POA: Diagnosis not present

## 2024-08-04 DIAGNOSIS — G8929 Other chronic pain: Secondary | ICD-10-CM

## 2024-08-04 DIAGNOSIS — R2689 Other abnormalities of gait and mobility: Secondary | ICD-10-CM | POA: Diagnosis not present

## 2024-08-04 DIAGNOSIS — M79671 Pain in right foot: Secondary | ICD-10-CM

## 2024-08-04 DIAGNOSIS — R159 Full incontinence of feces: Secondary | ICD-10-CM | POA: Diagnosis not present

## 2024-08-04 DIAGNOSIS — M542 Cervicalgia: Secondary | ICD-10-CM

## 2024-08-04 DIAGNOSIS — M5441 Lumbago with sciatica, right side: Secondary | ICD-10-CM | POA: Diagnosis not present

## 2024-08-04 DIAGNOSIS — M79672 Pain in left foot: Secondary | ICD-10-CM | POA: Diagnosis not present

## 2024-08-04 DIAGNOSIS — M25561 Pain in right knee: Secondary | ICD-10-CM | POA: Diagnosis not present

## 2024-08-04 DIAGNOSIS — M25562 Pain in left knee: Secondary | ICD-10-CM | POA: Diagnosis not present

## 2024-08-04 NOTE — Therapy (Signed)
 " OUTPATIENT PHYSICAL THERAPY TREATMENT    Patient Name: Pamela Avila MRN: [id number] DOB:[birth year], 53 y.o., female Today's Date: 08/04/2024   PT End of Session - 08/04/24 1322     Visit Number 3    Number of Visits 10    Date for Recertification  09/24/24    PT Start Time 1330    PT Stop Time 1415    PT Time Calculation (min) 45 min    Activity Tolerance Patient tolerated treatment well;No increased pain    Behavior During Therapy WFL for tasks assessed/performed          Past Medical History:  Diagnosis Date   ADHD (attention deficit hyperactivity disorder)    Anemia    Anxiety    Anxiety disorder    Complication of anesthesia    Depression    Family history of diabetes mellitus    GERD (gastroesophageal reflux disease)    Gestational diabetes 1993   Headache    HSV (herpes simplex virus) infection    Hypertension    Hypothyroidism    IBS (irritable bowel syndrome)    Lab test positive for detection of COVID-19 virus    PONV (postoperative nausea and vomiting)    Sleep apnea    Thyroid  disease    hypothroidsim   Past Surgical History:  Procedure Laterality Date   BIOPSY  03/20/2023   Procedure: BIOPSY;  Surgeon: Therisa Bi, MD;  Location: [facility] ENDOSCOPY;  Service: Gastroenterology;;   BREAST EXCISIONAL BIOPSY Right 2010   neg surgical breast bx     BREAST SURGERY Right    lumpectomy   CESAREAN SECTION     x 2   COLONOSCOPY WITH PROPOFOL  N/A 12/28/2016   Procedure: COLONOSCOPY WITH PROPOFOL ;  Surgeon: Therisa Bi, MD;  Location: [facility] ENDOSCOPY;  Service: Endoscopy;  Laterality: N/A;   COLONOSCOPY WITH PROPOFOL  N/A 03/20/2023   Procedure: COLONOSCOPY WITH PROPOFOL ;  Surgeon: Therisa Bi, MD;  Location: [facility] ENDOSCOPY;  Service: Gastroenterology;  Laterality: N/A;   CRYOTHERAPY  20 + years ago   cervix   DILITATION & CURRETTAGE/HYSTROSCOPY WITH NOVASURE ABLATION N/A 04/11/2015   Procedure: DILATATION & CURETTAGE/HYSTEROSCOPY WITH NOVASURE ABLATION;   Surgeon: Gladis DELENA Dollar, MD;  Location: [facility] ORS;  Service: Gynecology;  Laterality: N/A;   ESOPHAGOGASTRODUODENOSCOPY (EGD) WITH PROPOFOL  N/A 12/28/2016   Procedure: ESOPHAGOGASTRODUODENOSCOPY (EGD) WITH PROPOFOL ;  Surgeon: Therisa Bi, MD;  Location: [facility] ENDOSCOPY;  Service: Endoscopy;  Laterality: N/A;   ESSURE TUBAL LIGATION     lumpectomy Right    MASS EXCISION Right 01/30/2023   Procedure: EXCISION MASS, soft tissue right buttock;  Surgeon: Lane Shope, MD;  Location: [facility] ORS;  Service: General;  Laterality: Right;   NASAL SEPTOPLASTY W/ TURBINOPLASTY Bilateral 10/07/2018   Procedure: SEPTOPLASTY WITH BILATERAL SUBMUCOUS RESECTION OF INFERIOR TURBINATES;  Surgeon: Herminio Miu, MD;  Location: [facility] ORS;  Service: ENT;  Laterality: Bilateral;   TONSILLECTOMY     TUBAL LIGATION     Patient Active Problem List   Diagnosis Date Noted   Chronic migraine with aura without status migrainosus, not intractable 05/26/2024   Full incontinence of feces 04/19/2024   Palpable mass of soft tissue of hip 01/23/2023   Postconcussion syndrome 02/14/2022   Obesity (BMI 30.0-34.9) 06/29/2019   Obstructive sleep apnea treated with continuous positive airway pressure (CPAP) 10/16/2018   Excessive daytime sleepiness 07/26/2018   Inadequate sleep hygiene 07/26/2018   Dysthymia 07/26/2018   Circadian rhythm sleep disorder, shift work type 07/26/2018   Status post  endometrial ablation 01/09/2018   Right atrial enlargement 05/26/2015   Family history of diabetes mellitus    ADHD (attention deficit hyperactivity disorder) 02/01/2015   Genital herpes 02/01/2015   Irritable bowel syndrome 02/01/2015   Hypothyroidism 02/01/2015   Anxiety and depression 02/01/2015   Benign essential hypertension 02/01/2015   GERD (gastroesophageal reflux disease) 02/01/2015   Tobacco user 02/01/2015    PCP:  Vicci Bouchard, DO   REFERRING PROVIDER: Vicci Bouchard, DO   REFERRING DIAG: Incontinences of  feces   Rationale for Evaluation and Treatment Rehabilitation  THERAPY DIAG:  Sacrococcygeal disorders, not elsewhere classified  Other abnormalities of gait and mobility  Chronic right-sided low back pain with right-sided sciatica  Chronic pain of both knees  Pain in both feet  Neck pain    ONSET DATE:   SUBJECTIVE:           SUBJECTIVE STATEMENT TODAY:   Pt reports she is practicing her exercises                                                                                                                                                      SUBJECTIVE STATEMENT ON EVAL 07/16/24 : 1) fecal leakage:   it occurred after her concussion from a fall onto , head hitting a metal bar. [name] occurs in the middle of the night without her awareness. It is a full bowel movement. Pt has IBS, Right now she is hard solid stools. So it is not happening as much right now. Leakage occurs when she is in her diarrhea stage of IBS. Sometimes it occurs a couple times a week or monthly.  Pt has seen a GI doctor done a colonsscopy. Pt has stopped eating high glucose corn syrup and noticed less diarrhea   2)  B LBP, radiating down R lateral thigh above the knee:  3-7 /10 pain. Takes tylenol  and Ibuprofen  several times a day. Occurs with standing / walking   > 20 min   3) B knee pain, R knee is worst  (10/10 with going down a step)  than L ( 2-3/10 ) : difficulty with stairs descending, and going one step at a step .  Occurred for years even  before her concussion in 2023.   4) B feet pain, L foot  worst than R:  occurs with walking around the house  .  Worse after her recent fall .  5) neck pain: the base of the skull started after her concussion in 2023. Neck Pain impacts her turning her head with driving, reaching up in the cabinets     PERTINENT HISTORY:  Hx of falls:   Fall with a concussion  June 2023: injury to head, since this fall, pt noticed her balance is off, not notice environment,  noticing fecal leakage while sleeping  .  Pt had stepped backwards and tripped over a block and hit her head on a metal bar.  Memory and focus issues started. Did not go get concussion therapy. Did not go to the doctor right away. Pt did see neurologist.      Last week : fell while training dog on a leash , landing in [place] , impacting L ankle , knee with bruise,R thigh, soreness along the SIJ   2 C section R breast lumpectomy for biosy Tubal ligation Ablation procedure due to abnormal bleeding  PAIN:  Are you having pain? Yes: see above   PRECAUTIONS: No  WEIGHT BEARING RESTRICTIONS: No   FALLS:  Has patient fallen in last 6 months? Yes, last week , pt fell   LIVING ENVIRONMENT: Lives with: boyfriend Lives in: one story home  Stairs: 4 STE with rail   OCCUPATION: not working currently, filing for disability since the concussion , her ADHD worsened after the concussion   PLOF: IND   PATIENT GOALS:  Improve neck pain, back / knee / feet  pain, fecal leakage , balance      OBJECTIVE:     [facility] PT Assessment - 08/04/24 1558       Palpation   SI assessment  Pelvis levelled,  L shoudler lowered    Palpation comment Hypomobile R C/T junction, mm tightness medial scapular area, deviated to R T3-5, T10-L2 , rib intercostals T10-12 R limited excursion anteriorly      Bed Mobility   Bed Mobility --          [facility] Adult PT Treatment/Exercise - 08/04/24 1558       Neuro Re-ed    Neuro Re-ed Details  provided propioceptive cues for  standing without locking knees and less chest breathing, more diaphragm breathing , especially excursion on R low ribs      Modalities   Modalities Moist Heat      Moist Heat Therapy   Number Minutes Moist Heat 5 Minutes    Moist Heat Location --   R posterior rotaion , supported back and knees     Manual Therapy   Manual therapy comments STM/MWM at C/T  and  T/L junction and problem areas noted in a ssessment, modified technique to  accmmodate comfort             HOME EXERCISE PROGRAM: See pt instruction section    ASSESSMENT:  CLINICAL IMPRESSION:                               Today, manual Tx  further addressed C/T and T/L junction hypomobility to minimize deviations to spine and tightness of mm. Provided propioceptive cues for  standing without locking knees and less chest breathing for relaxation, more diaphragm breathing , especially excursion on R low ribs . These improvements will further help with pt progressing to deep core training    Plan to address her c/o feet pain next session  Plan to reassess realignment of spine/ pelvis at next session to help promote optimize intra-abdominal pressure system (IAP)  for improved pelvic floor function, trunk stability, gait, balance, stabilization with mobility tasks.  Plan to address pelvic floor issues once pelvis and spine are realigned to yield better outcomes.    Regional interdependent approaches will yield greater benefits in pt's POC due to the complexity of pt's medical Hx and the significant impact their Sx have had on their QOL.  Pt benefits from skilled PT to address:  1) fecal leakage  2)  B LBP, radiating down R lateral thigh above the knee  3) B knee pain, R knee is worst than L  4) B feet pain, L foot worst than R.    OBJECTIVE IMPAIRMENTS decreased activity tolerance, decreased coordination, decreased endurance, decreased mobility, difficulty walking, decreased ROM, decreased strength, decreased safety awareness, hypomobility, increased muscle spasms, impaired flexibility, improper body mechanics, postural dysfunction, and pain. scar restrictions   ACTIVITY LIMITATIONS  self-care,  sleep, home chores, work tasks    PARTICIPATION LIMITATIONS:  community, social  activities    PERSONAL FACTORS   affecting patient's functional outcome:    REHAB POTENTIAL: Good   CLINICAL DECISION  MAKING: Evolving/moderate complexity   EVALUATION COMPLEXITY: Moderate    PATIENT EDUCATION:    Education details: Showed pt anatomy images. Explained muscles attachments/ connection, physiology of deep core system/ spinal- thoracic-pelvis-lower kinetic chain as they relate to pt's presentation, Sx, and past Hx. Explained what and how these areas of deficits need to be restored to balance and function    See Therapeutic activity / neuromuscular re-education section  Answered pt's questions.   Person educated: Patient Education method: Explanation, Demonstration, Tactile cues, Verbal cues, and Handouts Education comprehension: verbalized understanding, returned demonstration, verbal cues required, tactile cues required, and needs further education     PLAN: PT FREQUENCY: 1x/week   PT DURATION: 10 weeks   PLANNED INTERVENTIONS:   Gait training;Stair training;Functional mobility training;DME Instruction;Therapeutic activities;Therapeutic exercise;Balance training;Neuromuscular re-education;Patient/family education;Vestibular;Visual/perceptual remediation/compensation;Passive range of motion;Moist Heat;Cryotherapy;Traction;Canalith Repostioning;Joint Manipulations;Manual lymph drainage;Manual techniques;Scar mobilization;Energy conservation;Dry needling;ADLs/Self Care Home Management;Biofeedback;Electrical Stimulation;Taping    PLAN FOR NEXT SESSION: See clinical impression for plan     GOALS: Goals reviewed with patient? Yes  SHORT TERM GOALS: Target date: 08/13/2024    Pt will demo IND with HEP                    Baseline: Not IND            Goal status: INITIAL   LONG TERM GOALS: Target date: 09/24/2024    1.Pt will demo proper deep core coordination without chest breathing and optimal excursion of diaphragm/pelvic floor in order to promote spinal stability and pelvic floor function  Baseline: dyscoordination Goal status: INITIAL  2.  Pt will demo proper body mechanics  in against gravity tasks and ADLs  work tasks, fitness  to minimize straining pelvic floor / back    Baseline: not IND, improper form that places strain on pelvic floor  Goal status: INITIAL    3. Pt will demo increased gait speed > 1.3 m/s with reciprocal gait pattern, longer stride length  in order to ambulate safely in community and return to fitness routine  Baseline: 1.03 m/s, decreased on L leg, R hip hike,  hyperextended knees  Goal status: INITIAL    4. Pt will demo levelled pelvic girdle and shoulder height in order to progress to deep core strengthening HEP and restore mobility at spine, pelvis, gait, posture minimize falls, and improve balance  Baseline: R shoulder, L iliac crest higher  Goal status: INITIAL   5. Pt will improve PFDI-7 questionnaire to  pts  score change  to demo improved QOL  Baseline:    ( greater pts indicate greater negative impact on QOL)   124 pts  ( total)  24  pts  ( UIQ-7 )  52  pts  ( CRAIQ-7 )  48  pts  ( POPIQ-7 ) Goal status: INITIAL   6.  Pt will be compliant with neck and spinal HEP Pt will report decreased neck pain  >50% with  her turning her head with driving, reaching up in the cabinets   Baseline:  Neck Pain impacts her turning her head with driving, reaching up in the cabinets  Goal status: INITIAL  7.  Pt will demo proper alignment and propioception of LKC with stair ascension/ descension , report < 5/10 pain descending step) in order to navigate community  Baseline/: B knee pain, R knee is worst  (10/10 with going down a step)  than L ( 2-3/10 ) : difficulty with stairs descending, and going one step at a step .  Occurred for years even  before her concussion in 2023.   8.  Pt will report minimal B feet pain< 2/10 with walking around her house in order to build a walking routine for health and wellness  Baseline: B feet pain, L foot , worst than R:  occurs with walking around the house  .  Worse after her recent fall . Goal  status: INITIAL  Pia Lupe Plump, PT 08/04/2024, 4:23 PM  "

## 2024-08-04 NOTE — Patient Instructions (Signed)
 __   Standing posture  Shift navel forward slightly, shift weight across heel and ball mound, unlock knees   __  Breathing with diaphragm and less upper chest

## 2024-08-05 ENCOUNTER — Other Ambulatory Visit: Payer: Self-pay

## 2024-08-11 ENCOUNTER — Encounter: Admitting: Physical Therapy

## 2024-08-19 ENCOUNTER — Other Ambulatory Visit (HOSPITAL_COMMUNITY): Payer: Self-pay

## 2024-08-20 ENCOUNTER — Ambulatory Visit: Payer: Self-pay | Admitting: Physical Therapy

## 2024-08-20 ENCOUNTER — Other Ambulatory Visit: Payer: Self-pay

## 2024-08-21 ENCOUNTER — Other Ambulatory Visit (HOSPITAL_COMMUNITY): Payer: Self-pay

## 2024-08-21 ENCOUNTER — Other Ambulatory Visit: Payer: Self-pay

## 2024-08-26 ENCOUNTER — Other Ambulatory Visit: Payer: Self-pay

## 2024-08-27 ENCOUNTER — Ambulatory Visit: Payer: Self-pay | Admitting: Physical Therapy

## 2024-08-27 ENCOUNTER — Other Ambulatory Visit: Payer: Self-pay

## 2024-08-27 ENCOUNTER — Ambulatory Visit: Payer: Self-pay | Admitting: Pharmacist

## 2024-08-28 ENCOUNTER — Telehealth: Payer: Self-pay | Admitting: Family Medicine

## 2024-08-28 ENCOUNTER — Other Ambulatory Visit: Payer: Self-pay

## 2024-08-28 DIAGNOSIS — E038 Other specified hypothyroidism: Secondary | ICD-10-CM

## 2024-08-28 NOTE — Telephone Encounter (Signed)
 Patient came to the office for labs and wanted provider to know that if her medication change as a result of her labs please in [facility] know.

## 2024-08-28 NOTE — Telephone Encounter (Signed)
 Provider aware

## 2024-08-29 ENCOUNTER — Encounter: Payer: Self-pay | Admitting: Family Medicine

## 2024-08-29 LAB — TSH: TSH: 4.59 u[IU]/mL — ABNORMAL HIGH (ref 0.450–4.500)

## 2024-08-30 ENCOUNTER — Ambulatory Visit: Payer: Self-pay | Admitting: Family Medicine

## 2024-08-30 DIAGNOSIS — E038 Other specified hypothyroidism: Secondary | ICD-10-CM

## 2024-08-30 MED ORDER — LEVOTHYROXINE SODIUM 125 MCG PO TABS
125.0000 ug | ORAL_TABLET | Freq: Every day | ORAL | 0 refills | Status: AC
  Filled 2024-08-30: qty 90, 90d supply, fill #0

## 2024-08-31 ENCOUNTER — Other Ambulatory Visit: Payer: Self-pay

## 2024-08-31 ENCOUNTER — Encounter: Payer: Self-pay | Admitting: Pharmacist

## 2024-08-31 ENCOUNTER — Other Ambulatory Visit (HOSPITAL_COMMUNITY): Payer: Self-pay

## 2024-08-31 NOTE — Progress Notes (Signed)
 Called patient lvm for patient to call office to schedule 6 week lab appt.

## 2024-09-01 ENCOUNTER — Other Ambulatory Visit: Payer: Self-pay

## 2024-09-02 NOTE — Progress Notes (Signed)
 Lab appt scheduled.

## 2024-09-03 ENCOUNTER — Ambulatory Visit: Payer: Self-pay | Admitting: Physical Therapy

## 2024-09-10 ENCOUNTER — Ambulatory Visit: Payer: Self-pay | Admitting: Physical Therapy

## 2024-09-16 ENCOUNTER — Other Ambulatory Visit: Payer: Self-pay

## 2024-09-17 ENCOUNTER — Ambulatory Visit: Payer: Self-pay | Admitting: Physical Therapy

## 2024-10-16 ENCOUNTER — Other Ambulatory Visit: Payer: Self-pay
